# Patient Record
Sex: Female | Born: 1970 | Race: White | Hispanic: No | Smoking: Former smoker
Health system: Southern US, Community
[De-identification: ages and names within clinical notes are randomized; demographics above are authoritative.]

## PROBLEM LIST (undated history)

## (undated) DIAGNOSIS — G5603 Carpal tunnel syndrome, bilateral upper limbs: Secondary | ICD-10-CM

## (undated) DIAGNOSIS — R51 Headache: Secondary | ICD-10-CM

## (undated) DIAGNOSIS — R112 Nausea with vomiting, unspecified: Secondary | ICD-10-CM

## (undated) DIAGNOSIS — M1990 Unspecified osteoarthritis, unspecified site: Secondary | ICD-10-CM

## (undated) DIAGNOSIS — F319 Bipolar disorder, unspecified: Secondary | ICD-10-CM

## (undated) DIAGNOSIS — F419 Anxiety disorder, unspecified: Secondary | ICD-10-CM

## (undated) DIAGNOSIS — Z9889 Other specified postprocedural states: Secondary | ICD-10-CM

## (undated) DIAGNOSIS — M549 Dorsalgia, unspecified: Secondary | ICD-10-CM

## (undated) DIAGNOSIS — G8929 Other chronic pain: Secondary | ICD-10-CM

## (undated) HISTORY — PX: ABDOMINAL HYSTERECTOMY: SHX81

## (undated) HISTORY — PX: CERVIX SURGERY: SHX593

## (undated) HISTORY — PX: TUBAL LIGATION: SHX77

## (undated) HISTORY — PX: ANTERIOR CRUCIATE LIGAMENT REPAIR: SHX115

## (undated) HISTORY — PX: KNEE ARTHROSCOPY: SUR90

---
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
## 1989-01-11 HISTORY — PX: KNEE ARTHROSCOPY: SUR90

## 1994-01-11 HISTORY — PX: TUBAL LIGATION: SHX77

## 2005-01-11 HISTORY — PX: ABDOMINAL HYSTERECTOMY: SHX81

## 2009-03-11 ENCOUNTER — Emergency Department (HOSPITAL_COMMUNITY): Admission: EM | Admit: 2009-03-11 | Discharge: 2009-03-11 | Payer: Self-pay | Admitting: Family Medicine

## 2009-03-25 ENCOUNTER — Emergency Department (HOSPITAL_COMMUNITY): Admission: EM | Admit: 2009-03-25 | Discharge: 2009-03-25 | Payer: Self-pay | Admitting: Family Medicine

## 2009-05-14 ENCOUNTER — Ambulatory Visit: Payer: Self-pay | Admitting: Internal Medicine

## 2009-05-27 ENCOUNTER — Ambulatory Visit: Payer: Self-pay | Admitting: Internal Medicine

## 2009-06-04 ENCOUNTER — Ambulatory Visit: Payer: Self-pay | Admitting: Internal Medicine

## 2009-07-02 ENCOUNTER — Ambulatory Visit: Payer: Self-pay | Admitting: Internal Medicine

## 2009-07-09 ENCOUNTER — Encounter
Admission: RE | Admit: 2009-07-09 | Discharge: 2009-10-07 | Payer: Self-pay | Admitting: Physical Medicine and Rehabilitation

## 2009-07-16 ENCOUNTER — Ambulatory Visit: Payer: Self-pay | Admitting: Physical Medicine and Rehabilitation

## 2009-07-30 ENCOUNTER — Ambulatory Visit (HOSPITAL_COMMUNITY)
Admission: RE | Admit: 2009-07-30 | Discharge: 2009-07-30 | Payer: Self-pay | Admitting: Physical Medicine and Rehabilitation

## 2009-08-15 ENCOUNTER — Ambulatory Visit: Payer: Self-pay | Admitting: Physical Medicine and Rehabilitation

## 2009-09-12 ENCOUNTER — Ambulatory Visit: Payer: Self-pay | Admitting: Physical Medicine and Rehabilitation

## 2009-11-04 ENCOUNTER — Encounter
Admission: RE | Admit: 2009-11-04 | Discharge: 2009-11-04 | Payer: Self-pay | Source: Home / Self Care | Attending: Physical Medicine and Rehabilitation | Admitting: Physical Medicine and Rehabilitation

## 2010-02-04 ENCOUNTER — Encounter
Admission: RE | Admit: 2010-02-04 | Discharge: 2010-02-04 | Payer: Self-pay | Source: Home / Self Care | Attending: Preventative Medicine | Admitting: Preventative Medicine

## 2010-02-19 ENCOUNTER — Other Ambulatory Visit: Payer: Self-pay | Admitting: Preventative Medicine

## 2010-02-19 DIAGNOSIS — M48061 Spinal stenosis, lumbar region without neurogenic claudication: Secondary | ICD-10-CM

## 2010-02-20 ENCOUNTER — Ambulatory Visit
Admission: RE | Admit: 2010-02-20 | Discharge: 2010-02-20 | Disposition: A | Discharge status: Home / Self Care | Payer: Medicaid Other | Source: Ambulatory Visit | Attending: Preventative Medicine | Admitting: Preventative Medicine

## 2010-02-20 DIAGNOSIS — M48061 Spinal stenosis, lumbar region without neurogenic claudication: Secondary | ICD-10-CM

## 2010-06-01 ENCOUNTER — Other Ambulatory Visit: Payer: Self-pay | Admitting: Preventative Medicine

## 2010-06-01 DIAGNOSIS — M48061 Spinal stenosis, lumbar region without neurogenic claudication: Secondary | ICD-10-CM

## 2010-06-03 ENCOUNTER — Other Ambulatory Visit: Payer: Medicaid Other

## 2010-06-10 ENCOUNTER — Ambulatory Visit
Admission: RE | Admit: 2010-06-10 | Discharge: 2010-06-10 | Disposition: A | Discharge status: Home / Self Care | Payer: Medicaid Other | Source: Ambulatory Visit | Attending: Preventative Medicine | Admitting: Preventative Medicine

## 2010-06-10 DIAGNOSIS — M48061 Spinal stenosis, lumbar region without neurogenic claudication: Secondary | ICD-10-CM

## 2010-12-18 ENCOUNTER — Other Ambulatory Visit: Payer: Self-pay | Admitting: Orthopedic Surgery

## 2010-12-21 ENCOUNTER — Other Ambulatory Visit: Payer: Self-pay | Admitting: Orthopedic Surgery

## 2011-01-07 ENCOUNTER — Encounter (HOSPITAL_COMMUNITY): Payer: Self-pay | Admitting: Pharmacy Technician

## 2011-01-14 ENCOUNTER — Encounter (HOSPITAL_COMMUNITY): Payer: Self-pay

## 2011-01-14 ENCOUNTER — Encounter (HOSPITAL_COMMUNITY)
Admission: RE | Admit: 2011-01-14 | Discharge: 2011-01-14 | Payer: Medicaid Other | Source: Ambulatory Visit | Attending: Orthopedic Surgery | Admitting: Orthopedic Surgery

## 2011-01-14 HISTORY — DX: Other specified postprocedural states: Z98.890

## 2011-01-14 HISTORY — DX: Anxiety disorder, unspecified: F41.9

## 2011-01-14 HISTORY — DX: Nausea with vomiting, unspecified: R11.2

## 2011-01-14 HISTORY — DX: Headache: R51

## 2011-01-14 HISTORY — DX: Other specified postprocedural states: R11.2

## 2011-01-14 LAB — BASIC METABOLIC PANEL
CO2: 31 mEq/L (ref 19–32)
Calcium: 9.4 mg/dL (ref 8.4–10.5)
Creatinine, Ser: 0.66 mg/dL (ref 0.50–1.10)
GFR calc Af Amer: >90 mL/min (ref >90)
GFR calc non Af Amer: >90 mL/min (ref >90)
Glucose, Bld: 81 mg/dL (ref 70–99)

## 2011-01-14 LAB — DIFFERENTIAL
Basophils Relative: 1 % (ref 0–1)
Lymphocytes Relative: 19 % (ref 12–46)
Lymphs Abs: 1 10*3/uL (ref 0.7–4.0)
Monocytes Relative: 6 % (ref 3–12)
Neutro Abs: 3.9 10*3/uL (ref 1.7–7.7)
Neutrophils Relative %: 72 % (ref 43–77)

## 2011-01-14 LAB — URINALYSIS, ROUTINE W REFLEX MICROSCOPIC
Glucose, UA: NEGATIVE mg/dL
Ketones, ur: NEGATIVE mg/dL
Leukocytes, UA: NEGATIVE
Protein, ur: NEGATIVE mg/dL

## 2011-01-14 LAB — PROTIME-INR: INR: 0.81 (ref 0.00–1.49)

## 2011-01-14 LAB — CBC
Hemoglobin: 14.1 g/dL (ref 12.0–15.0)
RBC: 4.71 MIL/uL (ref 3.87–5.11)
WBC: 5.4 10*3/uL (ref 4.0–10.5)

## 2011-01-14 LAB — TYPE AND SCREEN: ABO/RH(D): AB NEG

## 2011-01-14 LAB — APTT: aPTT: 30 seconds (ref 24–37)

## 2011-01-14 MED ORDER — CHLORHEXIDINE GLUCONATE 4 % EX LIQD: 60.0000 mL | Freq: Once | CUTANEOUS | Status: DC

## 2011-01-14 NOTE — Pre-Procedure Instructions (Signed)
19 Tiffany Romero  01/14/2011   Your procedure is scheduled on:  Jan. 7, 2013  Report to [facility] at 1030 AM.  Call this number if you have problems the morning of surgery: [phone]   Remember:   Do not eat food:After Midnight.  May have clear liquids: up to 4 Hours before arrival.  Clear liquids include soda, tea, black coffee, apple or grape juice, broth.  Take these medicines the morning of surgery with A SIP OF WATER: pain pill   Do not wear jewelry, make-up or nail polish.  Do not wear lotions, powders, or perfumes. You may wear deodorant.  Do not shave 48 hours prior to surgery.  Do not bring valuables to the hospital.  Contacts, dentures or bridgework may not be worn into surgery.  Leave suitcase in the car. After surgery it may be brought to your room.  For patients admitted to the hospital, checkout time is 11:00 AM the day of discharge.   Patients discharged the day of surgery will not be allowed to drive home.  Name and phone number of your driver: [name]  Special Instructions: CHG Shower Use Special Wash: 1/2 bottle night before surgery and 1/2 bottle morning of surgery.   Please read over the following fact sheets that you were given: Pain Booklet, Blood Transfusion Information, Total Joint Packet and Surgical Site Infection Prevention

## 2011-01-16 DIAGNOSIS — M1610 Unilateral primary osteoarthritis, unspecified hip: Secondary | ICD-10-CM | POA: Diagnosis present

## 2011-01-16 NOTE — H&P (Signed)
Tiffany Romero is an 41 y.o. female.   Chief Complaint: Right Hip pain, endstage arthritis, bone on bone HPI: Tiffany Romero is a 41 year old with an 34-month history of right hip pain.  She localizes her pain to the groin and the posterior aspect of the hip.  She had x-rays done at [facility] that demonstrated end stage arthritis. She is currently on social security disability for hip and back pain and she plans to start a new job soon. She has had epidural injections into her lower back that have provided her with significant pain relief and right now her hip pain is her main complaint. She has failed treatment with NSAIDS, narcotics, PT, and intrarticular cortisone. Pain wakes her at nite and limits activities. She cna only walk 2-3 blocks without rest. She has declinrd use of a cane  Past Medical History  Diagnosis Date  . PONV (postoperative nausea and vomiting)   . Headache     migraines related to stress  . Anxiety     takes lexapro    Past Surgical History  Procedure Date  . Cesarean section     1989  . Anterior cruciate ligament repair   . Knee arthroscopy   . Tubal ligation     1996  . Abdominal hysterectomy     2007  . Cervix surgery     No family history on file. Social History:  reports that she has been smoking.  She does not have any smokeless tobacco history on file. She reports that she does not drink alcohol or use illicit drugs.  Allergies: No Known Allergies  No current facility-administered medications on file as of .   No current outpatient prescriptions on file as of .    Results for orders placed during the hospital encounter of 01/14/11 (from the past 48 hour(s))  SURGICAL PCR SCREEN     Status: Abnormal   Collection Time   01/14/11  1:13 PM      Component Value Range Comment   MRSA, PCR NEGATIVE  NEGATIVE     Staphylococcus aureus POSITIVE (*) NEGATIVE    TYPE AND SCREEN     Status: Normal   Collection Time   01/14/11  1:30 PM   Component Value Range Comment   ABO/RH(D) AB NEG      Antibody Screen NEG      Sample Expiration 01/28/2011     ABO/RH     Status: Normal   Collection Time   01/14/11  1:30 PM      Component Value Range Comment   ABO/RH(D) AB NEG     APTT     Status: Normal   Collection Time   01/14/11  1:32 PM      Component Value Range Comment   aPTT 30  24 - 37 (seconds)   BASIC METABOLIC PANEL     Status: Abnormal   Collection Time   01/14/11  1:32 PM      Component Value Range Comment   Sodium 139  135 - 145 (mEq/L)    Potassium 4.4  3.5 - 5.1 (mEq/L)    Chloride 101  96 - 112 (mEq/L)    CO2 31  19 - 32 (mEq/L)    Glucose, Bld 81  70 - 99 (mg/dL)    BUN 5 (*) 6 - 23 (mg/dL)    Creatinine, Ser 1.61  0.50 - 1.10 (mg/dL)    Calcium 9.4  8.4 - 10.5 (mg/dL)  GFR calc non Af Amer >90  >90 (mL/min)    GFR calc Af Amer >90  >90 (mL/min)   CBC     Status: Normal   Collection Time   01/14/11  1:32 PM      Component Value Range Comment   WBC 5.4  4.0 - 10.5 (K/uL)    RBC 4.71  3.87 - 5.11 (MIL/uL)    Hemoglobin 14.1  12.0 - 15.0 (g/dL)    HCT 54.0  98.1 - 19.1 (%)    MCV 91.7  78.0 - 100.0 (fL)    MCH 29.9  26.0 - 34.0 (pg)    MCHC 32.6  30.0 - 36.0 (g/dL)    RDW 47.8  29.5 - 62.1 (%)    Platelets 191  150 - 400 (K/uL)   DIFFERENTIAL     Status: Normal   Collection Time   01/14/11  1:32 PM      Component Value Range Comment   Neutrophils Relative 72  43 - 77 (%)    Neutro Abs 3.9  1.7 - 7.7 (K/uL)    Lymphocytes Relative 19  12 - 46 (%)    Lymphs Abs 1.0  0.7 - 4.0 (K/uL)    Monocytes Relative 6  3 - 12 (%)    Monocytes Absolute 0.3  0.1 - 1.0 (K/uL)    Eosinophils Relative 2  0 - 5 (%)    Eosinophils Absolute 0.1  0.0 - 0.7 (K/uL)    Basophils Relative 1  0 - 1 (%)    Basophils Absolute 0.1  0.0 - 0.1 (K/uL)   PROTIME-INR     Status: Abnormal   Collection Time   01/14/11  1:32 PM      Component Value Range Comment   Prothrombin Time 11.4 (*) 11.6 - 15.2 (seconds)    INR 0.81  0.00 - 1.49     URINALYSIS, ROUTINE W REFLEX MICROSCOPIC     Status: Normal   Collection Time   01/14/11  1:39 PM      Component Value Range Comment   Color, Urine YELLOW  YELLOW     APPearance CLEAR  CLEAR     Specific Gravity, Urine 1.016  1.005 - 1.030     pH 7.5  5.0 - 8.0     Glucose, UA NEGATIVE  NEGATIVE (mg/dL)    Hgb urine dipstick NEGATIVE  NEGATIVE     Bilirubin Urine NEGATIVE  NEGATIVE     Ketones, ur NEGATIVE  NEGATIVE (mg/dL)    Protein, ur NEGATIVE  NEGATIVE (mg/dL)    Urobilinogen, UA 0.2  0.0 - 1.0 (mg/dL)    Nitrite NEGATIVE  NEGATIVE     Leukocytes, UA NEGATIVE  NEGATIVE  MICROSCOPIC NOT DONE ON URINES WITH NEGATIVE PROTEIN, BLOOD, LEUKOCYTES, NITRITE, OR GLUCOSE <1000 mg/dL.   No results found.  Review of Systems  Constitutional: Negative.   HENT:       Migraines uses maxalt  Eyes: Negative.   Respiratory: Negative.   Cardiovascular: Negative.   Gastrointestinal: Negative.   Genitourinary: Negative.   Skin: Negative.   Neurological: Positive for headaches.  Endo/Heme/Allergies: Negative.   Psychiatric/Behavioral: Negative.     There were no vitals taken for this visit. Physical Exam  Constitutional: She is oriented to person, place, and time. She appears well-developed and well-nourished.  HENT:  Head: Normocephalic.  Neck: Normal range of motion. Neck supple.  Cardiovascular: Normal rate and regular rhythm.   Respiratory: Effort normal and breath sounds normal.  GI: Soft. Bowel sounds are normal.  Musculoskeletal:       The right hip demonstrates significant pain with attempts at internal andexternal rotation with the knee flexed and fully extended.  She has a negative foot tap.  Skin is intact. Neurovascular is within normal limits.   Neurological: She is alert and oriented to person, place, and time.  Skin: Skin is warm and dry.  Psychiatric: She has a normal mood and affect. Her behavior is normal. Judgment and thought content normal.    Imaging: Two views of  the right hip were ordered, performed, and interpreted by me today demonstrate end stage arthritis.  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      Ellina Sivertsen J 01/16/2011, 10:07 AM

## 2011-01-17 MED ORDER — CEFAZOLIN SODIUM-DEXTROSE 2-3 GM-% IV SOLR
2.0000 g | INTRAVENOUS | Status: AC
  Administered 2011-01-18: 2 g via INTRAVENOUS
  Filled 2011-01-17 (×2): qty 50

## 2011-01-18 ENCOUNTER — Encounter (HOSPITAL_COMMUNITY)
Admission: RE | Disposition: A | Discharge status: Home / Self Care | Payer: Self-pay | Source: Ambulatory Visit | Attending: Orthopedic Surgery

## 2011-01-18 ENCOUNTER — Ambulatory Visit (HOSPITAL_COMMUNITY): Payer: Medicaid Other

## 2011-01-18 ENCOUNTER — Encounter (HOSPITAL_COMMUNITY): Payer: Self-pay | Admitting: Surgery

## 2011-01-18 ENCOUNTER — Encounter (HOSPITAL_COMMUNITY): Payer: Self-pay | Admitting: Anesthesiology

## 2011-01-18 ENCOUNTER — Ambulatory Visit (HOSPITAL_COMMUNITY): Payer: Medicaid Other | Admitting: Anesthesiology

## 2011-01-18 ENCOUNTER — Inpatient Hospital Stay (HOSPITAL_COMMUNITY)
Admission: RE | Admit: 2011-01-18 | Discharge: 2011-01-20 | DRG: 470 | Disposition: A | Discharge status: Home / Self Care | Payer: Medicaid Other | Source: Ambulatory Visit | Attending: Orthopedic Surgery | Admitting: Orthopedic Surgery

## 2011-01-18 DIAGNOSIS — F17200 Nicotine dependence, unspecified, uncomplicated: Secondary | ICD-10-CM | POA: Diagnosis present

## 2011-01-18 DIAGNOSIS — Z01812 Encounter for preprocedural laboratory examination: Secondary | ICD-10-CM

## 2011-01-18 DIAGNOSIS — F411 Generalized anxiety disorder: Secondary | ICD-10-CM | POA: Diagnosis present

## 2011-01-18 DIAGNOSIS — M1610 Unilateral primary osteoarthritis, unspecified hip: Principal | ICD-10-CM | POA: Diagnosis present

## 2011-01-18 DIAGNOSIS — M169 Osteoarthritis of hip, unspecified: Principal | ICD-10-CM | POA: Diagnosis present

## 2011-01-18 HISTORY — PX: TOTAL HIP ARTHROPLASTY: SHX124

## 2011-01-18 SURGERY — ARTHROPLASTY, HIP, TOTAL,POSTERIOR APPROACH
Anesthesia: General | Site: Hip | Laterality: Right | Wound class: Clean

## 2011-01-18 MED ORDER — WARFARIN VIDEO: Freq: Once | Status: DC

## 2011-01-18 MED ORDER — ONDANSETRON HCL 4 MG/2ML IJ SOLN
INTRAMUSCULAR | Status: DC | PRN
  Administered 2011-01-18 (×2): 4 mg via INTRAVENOUS

## 2011-01-18 MED ORDER — WARFARIN SODIUM 7.5 MG PO TABS
7.5000 mg | ORAL_TABLET | Freq: Once | ORAL | Status: AC
  Administered 2011-01-18: 7.5 mg via ORAL
  Filled 2011-01-18: qty 1

## 2011-01-18 MED ORDER — HYDROMORPHONE HCL PF 1 MG/ML IJ SOLN
0.2500 mg | INTRAMUSCULAR | Status: DC | PRN
  Administered 2011-01-18 (×3): 0.5 mg via INTRAVENOUS

## 2011-01-18 MED ORDER — DEXTROSE-NACL 5-0.45 % IV SOLN: INTRAVENOUS | Status: DC

## 2011-01-18 MED ORDER — METOCLOPRAMIDE HCL 5 MG/ML IJ SOLN
5.0000 mg | Freq: Three times a day (TID) | INTRAMUSCULAR | Status: DC | PRN
  Filled 2011-01-18: qty 2

## 2011-01-18 MED ORDER — LACTATED RINGERS IV SOLN
INTRAVENOUS | Status: DC | PRN
  Administered 2011-01-18 (×3): via INTRAVENOUS

## 2011-01-18 MED ORDER — ALUMINUM HYDROXIDE GEL 320 MG/5ML PO SUSP
15.0000 mL | ORAL | Status: DC | PRN
  Filled 2011-01-18: qty 30

## 2011-01-18 MED ORDER — DEXAMETHASONE SODIUM PHOSPHATE 4 MG/ML IJ SOLN
INTRAMUSCULAR | Status: DC | PRN
  Administered 2011-01-18: 8 mg via INTRAVENOUS

## 2011-01-18 MED ORDER — ONDANSETRON HCL 4 MG/2ML IJ SOLN: 4.0000 mg | Freq: Once | INTRAMUSCULAR | Status: DC | PRN

## 2011-01-18 MED ORDER — ACETAMINOPHEN 10 MG/ML IV SOLN
INTRAVENOUS | Status: DC | PRN
  Administered 2011-01-18: 1000 mg via INTRAVENOUS

## 2011-01-18 MED ORDER — ACETAMINOPHEN 650 MG RE SUPP: 650.0000 mg | Freq: Four times a day (QID) | RECTAL | Status: DC | PRN

## 2011-01-18 MED ORDER — ACETAMINOPHEN 325 MG PO TABS: 650.0000 mg | ORAL_TABLET | Freq: Four times a day (QID) | ORAL | Status: DC | PRN

## 2011-01-18 MED ORDER — ESCITALOPRAM OXALATE 10 MG PO TABS
10.0000 mg | ORAL_TABLET | Freq: Every day | ORAL | Status: DC
  Administered 2011-01-18 – 2011-01-19 (×2): 10 mg via ORAL
  Filled 2011-01-18 (×3): qty 1

## 2011-01-18 MED ORDER — LACTATED RINGERS IV SOLN
INTRAVENOUS | Status: DC
  Administered 2011-01-18: 12:00:00 via INTRAVENOUS

## 2011-01-18 MED ORDER — HYDROMORPHONE HCL PF 1 MG/ML IJ SOLN
INTRAMUSCULAR | Status: AC
  Filled 2011-01-18: qty 1

## 2011-01-18 MED ORDER — GLYCOPYRROLATE 0.2 MG/ML IJ SOLN
INTRAMUSCULAR | Status: DC | PRN
  Administered 2011-01-18: .4 mg via INTRAVENOUS

## 2011-01-18 MED ORDER — BISACODYL 5 MG PO TBEC: 5.0000 mg | DELAYED_RELEASE_TABLET | Freq: Every day | ORAL | Status: DC | PRN

## 2011-01-18 MED ORDER — SUMATRIPTAN SUCCINATE 50 MG PO TABS
50.0000 mg | ORAL_TABLET | ORAL | Status: DC | PRN
  Filled 2011-01-18: qty 1

## 2011-01-18 MED ORDER — SENNOSIDES-DOCUSATE SODIUM 8.6-50 MG PO TABS: 1.0000 | ORAL_TABLET | Freq: Every evening | ORAL | Status: DC | PRN

## 2011-01-18 MED ORDER — MENTHOL 3 MG MT LOZG: 1.0000 | LOZENGE | OROMUCOSAL | Status: DC | PRN

## 2011-01-18 MED ORDER — CYCLOBENZAPRINE HCL 10 MG PO TABS
10.0000 mg | ORAL_TABLET | Freq: Three times a day (TID) | ORAL | Status: DC
Start: 2011-01-18 — End: 2011-01-20
  Administered 2011-01-18 – 2011-01-20 (×7): 10 mg via ORAL
  Filled 2011-01-18 (×9): qty 1

## 2011-01-18 MED ORDER — OXYCODONE HCL 5 MG PO TABS
5.0000 mg | ORAL_TABLET | ORAL | Status: DC | PRN
  Administered 2011-01-18 – 2011-01-20 (×8): 10 mg via ORAL
  Administered 2011-01-20: 5 mg via ORAL
  Administered 2011-01-20 (×2): 10 mg via ORAL
  Filled 2011-01-18 (×11): qty 2

## 2011-01-18 MED ORDER — FENTANYL CITRATE 0.05 MG/ML IJ SOLN
INTRAMUSCULAR | Status: DC | PRN
  Administered 2011-01-18: 25 ug via INTRAVENOUS
  Administered 2011-01-18: 50 ug via INTRAVENOUS
  Administered 2011-01-18: 25 ug via INTRAVENOUS
  Administered 2011-01-18 (×5): 50 ug via INTRAVENOUS
  Administered 2011-01-18: 150 ug via INTRAVENOUS

## 2011-01-18 MED ORDER — MIDAZOLAM HCL 5 MG/5ML IJ SOLN
INTRAMUSCULAR | Status: DC | PRN
  Administered 2011-01-18: 2 mg via INTRAVENOUS

## 2011-01-18 MED ORDER — DROPERIDOL 2.5 MG/ML IJ SOLN
INTRAMUSCULAR | Status: DC | PRN
  Administered 2011-01-18: 0.625 mg via INTRAVENOUS

## 2011-01-18 MED ORDER — BUPIVACAINE-EPINEPHRINE 0.5% -1:200000 IJ SOLN
INTRAMUSCULAR | Status: DC | PRN
  Administered 2011-01-18: 20 mL

## 2011-01-18 MED ORDER — ENOXAPARIN SODIUM 40 MG/0.4ML ~~LOC~~ SOLN
40.0000 mg | SUBCUTANEOUS | Status: DC
  Administered 2011-01-19 – 2011-01-20 (×2): 40 mg via SUBCUTANEOUS
  Filled 2011-01-18 (×3): qty 0.4

## 2011-01-18 MED ORDER — ROCURONIUM BROMIDE 100 MG/10ML IV SOLN
INTRAVENOUS | Status: DC | PRN
  Administered 2011-01-18: 50 mg via INTRAVENOUS

## 2011-01-18 MED ORDER — ONDANSETRON HCL 4 MG/2ML IJ SOLN: 4.0000 mg | Freq: Four times a day (QID) | INTRAMUSCULAR | Status: DC | PRN

## 2011-01-18 MED ORDER — CEFAZOLIN SODIUM 1-5 GM-% IV SOLN: 1.0000 g | INTRAVENOUS | Status: DC

## 2011-01-18 MED ORDER — HYDROMORPHONE HCL PF 1 MG/ML IJ SOLN
0.5000 mg | INTRAMUSCULAR | Status: DC | PRN
  Administered 2011-01-18: 0.5 mg via INTRAVENOUS
  Administered 2011-01-18 – 2011-01-20 (×10): 1 mg via INTRAVENOUS
  Filled 2011-01-18 (×9): qty 1

## 2011-01-18 MED ORDER — HYDROCODONE-ACETAMINOPHEN 5-325 MG PO TABS: 1.0000 | ORAL_TABLET | ORAL | Status: DC | PRN

## 2011-01-18 MED ORDER — ACETAMINOPHEN 10 MG/ML IV SOLN
INTRAVENOUS | Status: AC
  Filled 2011-01-18: qty 100

## 2011-01-18 MED ORDER — DIPHENHYDRAMINE HCL 12.5 MG/5ML PO ELIX
12.5000 mg | ORAL_SOLUTION | ORAL | Status: DC | PRN
  Filled 2011-01-18: qty 10

## 2011-01-18 MED ORDER — ZOLPIDEM TARTRATE 5 MG PO TABS
5.0000 mg | ORAL_TABLET | Freq: Every evening | ORAL | Status: DC | PRN
  Administered 2011-01-18 – 2011-01-19 (×2): 5 mg via ORAL
  Filled 2011-01-18 (×2): qty 1

## 2011-01-18 MED ORDER — FLEET ENEMA 7-19 GM/118ML RE ENEM: 1.0000 | ENEMA | Freq: Once | RECTAL | Status: AC | PRN

## 2011-01-18 MED ORDER — PROPOFOL 10 MG/ML IV EMUL
INTRAVENOUS | Status: DC | PRN
  Administered 2011-01-18: 140 mg via INTRAVENOUS

## 2011-01-18 MED ORDER — NEOSTIGMINE METHYLSULFATE 1 MG/ML IJ SOLN
INTRAMUSCULAR | Status: DC | PRN
  Administered 2011-01-18: 3 mg via INTRAVENOUS

## 2011-01-18 MED ORDER — KCL IN DEXTROSE-NACL 20-5-0.45 MEQ/L-%-% IV SOLN
INTRAVENOUS | Status: DC
  Administered 2011-01-18 – 2011-01-19 (×3): via INTRAVENOUS
  Filled 2011-01-18 (×8): qty 1000

## 2011-01-18 MED ORDER — PHENOL 1.4 % MT LIQD
1.0000 | OROMUCOSAL | Status: DC | PRN
  Filled 2011-01-18: qty 177

## 2011-01-18 MED ORDER — MEPERIDINE HCL 25 MG/ML IJ SOLN: 6.2500 mg | INTRAMUSCULAR | Status: DC | PRN

## 2011-01-18 MED ORDER — ONDANSETRON HCL 4 MG PO TABS: 4.0000 mg | ORAL_TABLET | Freq: Four times a day (QID) | ORAL | Status: DC | PRN

## 2011-01-18 MED ORDER — METOCLOPRAMIDE HCL 10 MG PO TABS: 5.0000 mg | ORAL_TABLET | Freq: Three times a day (TID) | ORAL | Status: DC | PRN

## 2011-01-18 MED ORDER — SODIUM CHLORIDE 0.9 % IR SOLN
Status: DC | PRN
  Administered 2011-01-18: 1000 mL

## 2011-01-18 MED ORDER — PATIENT'S GUIDE TO USING COUMADIN BOOK
Freq: Once | Status: AC
  Administered 2011-01-18: 18:00:00
  Filled 2011-01-18: qty 1

## 2011-01-18 SURGICAL SUPPLY — 59 items
BLADE SAW SAG 73X25 THK (BLADE) ×1
BLADE SAW SGTL 18X1.27X75 (BLADE) IMPLANT
BLADE SAW SGTL 73X25 THK (BLADE) ×1 IMPLANT
BLADE SAW SGTL MED 73X18.5 STR (BLADE) IMPLANT
BRUSH FEMORAL CANAL (MISCELLANEOUS) IMPLANT
CLOTH BEACON ORANGE TIMEOUT ST (SAFETY) ×2 IMPLANT
COVER BACK TABLE 24X17X13 BIG (DRAPES) IMPLANT
COVER SURGICAL LIGHT HANDLE (MISCELLANEOUS) ×4 IMPLANT
DRAPE ORTHO SPLIT 77X108 STRL (DRAPES) ×1
DRAPE PROXIMA HALF (DRAPES) ×2 IMPLANT
DRAPE SURG ORHT 6 SPLT 77X108 (DRAPES) ×1 IMPLANT
DRAPE U-SHAPE 47X51 STRL (DRAPES) ×2 IMPLANT
DRILL BIT 7/64X5 (BIT) ×2 IMPLANT
DRSG MEPILEX BORDER 4X12 (GAUZE/BANDAGES/DRESSINGS) ×2 IMPLANT
DRSG MEPILEX BORDER 4X8 (GAUZE/BANDAGES/DRESSINGS) ×2 IMPLANT
DURAPREP 26ML APPLICATOR (WOUND CARE) ×2 IMPLANT
ELECT BLADE 4.0 EZ CLEAN MEGAD (MISCELLANEOUS) ×2
ELECT CAUTERY BLADE 6.4 (BLADE) ×2 IMPLANT
ELECT REM PT RETURN 9FT ADLT (ELECTROSURGICAL) ×2
ELECTRODE BLDE 4.0 EZ CLN MEGD (MISCELLANEOUS) ×1 IMPLANT
ELECTRODE REM PT RTRN 9FT ADLT (ELECTROSURGICAL) ×1 IMPLANT
FLOSEAL 10ML (HEMOSTASIS) IMPLANT
GAUZE XEROFORM 1X8 LF (GAUZE/BANDAGES/DRESSINGS) ×2 IMPLANT
GLOVE BIO SURGEON STRL SZ7 (GLOVE) ×4 IMPLANT
GLOVE BIO SURGEON STRL SZ7.5 (GLOVE) ×2 IMPLANT
GLOVE BIOGEL PI IND STRL 6.5 (GLOVE) ×3 IMPLANT
GLOVE BIOGEL PI IND STRL 7.0 (GLOVE) ×2 IMPLANT
GLOVE BIOGEL PI IND STRL 8 (GLOVE) ×1 IMPLANT
GLOVE BIOGEL PI INDICATOR 6.5 (GLOVE) ×3
GLOVE BIOGEL PI INDICATOR 7.0 (GLOVE) ×2
GLOVE BIOGEL PI INDICATOR 8 (GLOVE) ×1
GLOVE ECLIPSE 6.5 STRL STRAW (GLOVE) ×4 IMPLANT
GOWN PREVENTION PLUS XLARGE (GOWN DISPOSABLE) ×4 IMPLANT
GOWN STRL NON-REIN LRG LVL3 (GOWN DISPOSABLE) ×6 IMPLANT
HANDPIECE INTERPULSE COAX TIP (DISPOSABLE)
HOOD PEEL AWAY FACE SHEILD DIS (HOOD) ×4 IMPLANT
KIT BASIN OR (CUSTOM PROCEDURE TRAY) ×2 IMPLANT
KIT ROOM TURNOVER OR (KITS) ×2 IMPLANT
MANIFOLD NEPTUNE II (INSTRUMENTS) ×2 IMPLANT
NEEDLE 22X1 1/2 (OR ONLY) (NEEDLE) ×2 IMPLANT
NS IRRIG 1000ML POUR BTL (IV SOLUTION) ×2 IMPLANT
PACK TOTAL JOINT (CUSTOM PROCEDURE TRAY) ×2 IMPLANT
PAD ARMBOARD 7.5X6 YLW CONV (MISCELLANEOUS) ×4 IMPLANT
PASSER SUT SWANSON 36MM LOOP (INSTRUMENTS) ×2 IMPLANT
PRESSURIZER FEMORAL UNIV (MISCELLANEOUS) IMPLANT
SET HNDPC FAN SPRY TIP SCT (DISPOSABLE) IMPLANT
SPONGE LAP 18X18 X RAY DECT (DISPOSABLE) ×2 IMPLANT
SUT ETHIBOND 2 V 37 (SUTURE) ×2 IMPLANT
SUT ETHILON 3 0 FSL (SUTURE) ×4 IMPLANT
SUT VIC AB 0 CTB1 27 (SUTURE) ×2 IMPLANT
SUT VIC AB 1 CTX 36 (SUTURE) ×1
SUT VIC AB 1 CTX36XBRD ANBCTR (SUTURE) ×1 IMPLANT
SUT VIC AB 2-0 CTB1 (SUTURE) ×2 IMPLANT
SYR CONTROL 10ML LL (SYRINGE) ×2 IMPLANT
TOWEL OR 17X24 6PK STRL BLUE (TOWEL DISPOSABLE) ×2 IMPLANT
TOWEL OR 17X26 10 PK STRL BLUE (TOWEL DISPOSABLE) ×2 IMPLANT
TOWER CARTRIDGE SMART MIX (DISPOSABLE) IMPLANT
TRAY FOLEY CATH 14FR (SET/KITS/TRAYS/PACK) ×2 IMPLANT
WATER STERILE IRR 1000ML POUR (IV SOLUTION) IMPLANT

## 2011-01-18 NOTE — Progress Notes (Signed)
ANTICOAGULATION CONSULT NOTE - Initial Consult  Pharmacy Consult for Coumadin Indication: VTE prophylaxis s/p THA  No Known Allergies  Patient Measurements:  Stated weight = 190 lbs   Vital Signs: Temp: 97.5 F (36.4 C) (01/07 1645) BP: 116/62 mmHg (01/07 1645) Pulse Rate: 59  (01/07 1645)  Labs: No results found for this basename: HGB:2,HCT:3,PLT:3,APTT:3,LABPROT:3,INR:3,HEPARINUNFRC:3,CREATININE:3,CKTOTAL:3,CKMB:3,TROPONINI:3 in the last 72 hours CrCl is unknown because there is no height on file for the current visit.  Medical History: Past Medical History  Diagnosis Date  . PONV (postoperative nausea and vomiting)   . Headache     migraines related to stress  . Anxiety     takes lexapro    Medications:  Scheduled:    . ceFAZolin (ANCEF) IV  2 g Intravenous On Call to OR  . cyclobenzaprine  10 mg Oral TID  . enoxaparin  40 mg Subcutaneous Q24H  . escitalopram  10 mg Oral QHS  . HYDROmorphone      . HYDROmorphone      . DISCONTD: ceFAZolin (ANCEF) IV  1 g Intravenous 60 min Pre-Op    Assessment: 41 year old starting Coumadin for VTE prophylaxis s/p total hip arthroplasty  Goal of Therapy:  INR 2-3   Plan:  1) Coumadin 7.5 mg po x 1 dose tonight 2) Begin Coumadin education 3) Daily PT / INR  Thank you.  Elwin Sleight 01/18/2011,5:37 PM

## 2011-01-18 NOTE — Preoperative (Signed)
Beta Blockers   Reason not to administer Beta Blockers:Not Applicable 

## 2011-01-18 NOTE — Transfer of Care (Signed)
Immediate Anesthesia Transfer of Care Note  Patient: Tiffany Romero  Procedure(s) Performed:  TOTAL HIP ARTHROPLASTY - DEPUY/PINNICAL POLY  Patient Location: PACU  Anesthesia Type: General  Level of Consciousness: awake, alert  and oriented  Airway & Oxygen Therapy: Patient Spontanous Breathing and Patient connected to nasal cannula oxygen  Post-op Assessment: Report given to PACU RN, Post -op Vital signs reviewed and stable and Patient moving all extremities  Post vital signs: Reviewed and stable  Complications: No apparent anesthesia complications

## 2011-01-18 NOTE — Anesthesia Postprocedure Evaluation (Signed)
Anesthesia Post Note  Patient: Tiffany Romero  Procedure(s) Performed:  TOTAL HIP ARTHROPLASTY - DEPUY/PINNICAL POLY  Anesthesia type: General  Patient location: PACU  Post pain: Pain level controlled and Adequate analgesia  Post assessment: Post-op Vital signs reviewed, Patient's Cardiovascular Status Stable, Respiratory Function Stable, Patent Airway and Pain level controlled  Last Vitals:  Filed Vitals:   01/18/11 1530  BP: 111/65  Pulse: 67  Temp:   Resp: 14    Post vital signs: Reviewed and stable  Level of consciousness: awake, alert  and oriented  Complications: No apparent anesthesia complications

## 2011-01-18 NOTE — Op Note (Signed)
OPERATIVE REPORT    DATE OF PROCEDURE:  01/18/2011       PREOPERATIVE DIAGNOSIS:  OSTEOARTHRITIS RIGHT HIP                                                       There is no height or weight on file to calculate BMI.     POSTOPERATIVE DIAGNOSIS:  Osteoarthritis right hip                                                           PROCEDURE:  R total hip arthroplasty using a 52 mm DePuy Pinnacle  Cup, [employer], 10-degree polyethylene liner index superior  and posterior, a +3 36 mm ceramic head, a #[id number] SROM stem, 18Dsm Cone   SURGEON: Jari Dipasquale J    ASSISTANT:   Danielle Laliberte PA-S ANESTHESIA:  General  BLOOD LOSS:700 FLUID REPLACEMENT: 1800 crystalloid DRAINS: Foley Catheter URINE OUTPUT: 300 COMPLICATIONS:  None    INDICATIONS FOR PROCEDURE: A 41 y.o. year-old @GENDER @  With  OSTEOARTHRITIS RIGHT HIP   for 3 years, x-rays show bone-on-bone arthritic changes. Despite conservative measures with observation, anti-inflammatory medicine, narcotics, use of a cane, has severe unremitting pain and can ambulate only a few blocks before resting.  Patient desires elective R total hip arthroplasty to decrease pain and increase function. The risks, benefits, and alternatives were discussed at length including but not limited to the risks of infection, bleeding, nerve injury, stiffness, blood clots, the need for revision surgery, cardiopulmonary complications, among others, and they were willing to proceed.y have been discussed. Questions answered.     PROCEDURE IN DETAIL: The patient was identified by armband,  received preoperative IV antibiotics in the holding area at [facility], taken to the operating room , appropriate anesthetic monitors  were attached and general endotracheal anesthesia induced. Foley catheter was inserted. He was rolled into the L lateral decubitus position and fixed there with a Stulberg Mark II pelvic clamp and the R lower extremity was  then prepped and draped  in the usual sterile fashion from the ankle to the hemipelvis. A time-out  procedure was performed. The skin along the lateral hip and thigh  infiltrated with 10 mL of 0.5% Marcaine and epinephrine solution. We  then made a posterolateral approach to the hip. With a #10 blade, 20 cm  incision through skin and subcutaneous tissue down to the level of the  IT band. Small bleeders were identified and cauterized. IT band cut in  line with skin incision exposing the greater trochanter. A Cobra retractor was placed between the gluteus minimus and the superior hip joint capsule, and a spiked Cobra between the quadratus femoris and the inferior hip joint capsule. This isolated the short  external rotators and piriformis tendons. These were tagged with a #2 Ethibond  suture and cut off their insertion on the intertrochanteric crest. The posterior  capsule was then developed into an acetabular-based flap from Posterior Superior off of the acetabulum out over the femoral neck and back posterior inferior to the acetabular rim. This flap was tagged with two #2 Ethibond sutures and retracted  protecting the sciatic nerve. This exposed the arthritic femoral head and osteophytes. The hip was then flexed and internally rotated, dislocating the femoral head and a standard neck cut performed 1 fingerbreadth above the lesser trochanter.  A spiked Cobra was placed in the cotyloid notch and a Hohmann retractor was then used to lever the femur anteriorly off of the anterior pelvic column. A posterior-inferior wing retractor was placed at the junction of the acetabulum and the ischium completing the acetabular exposure.We then removed the peripheral osteophytes and labrum from the acetabulum. We then reamed the acetabulum up to 51 mm with basket reamers obtaining good coverage in all quadrants, irrigated out with normal  saline solution and hammered into place a 52 mm pinnacle cup in 45  degrees of  abduction and about 20 degrees of anteversion. More  peripheral osteophytes removed and a trial 10-degree liner placed with the  index superior-posterior. The hip was then flexed and internally rotated exposing the  proximal femur, which was entered with the initiating reamer followed by  the axial reamers up to a 13.5 mm full depth and 14mm partial depth. We then conically reamed to 18D to the correct depth for a 42 base neck. The calcar was milled to 18Dsm. A trial cone and stem was inserted in the 25 degrees anteversion, with a +3 36mm trial head. Trial reduction was then performed and excellent stability was noted with at 90 of flexion with 70deg of internal rotation and then full extension with maximal external rotation. The hip could not be dislocated in full extension. The knee could easily flex  to about 130 degrees. We also stretched the abductors at this point,  because of the preexisting adductor contractures. All trial components  were then removed. The acetabulum was irrigated out with normal saline  solution. A titanium Apex [facility] was then screwed into place  followed by a 10-degree polyethylene liner index superior-posterior. On  the femoral side a 18Dsm ZTT1 cone was hammered into place, followed by a [id number] SROM stem in 25 degrees of anteversion. At this point, a +3 36 mm ceramic head was  hammered on the stem. The hip was reduced. We checked our stability  one more time and found to be excellent. The wound was once again  thoroughly irrigated out with normal saline solution pulse lavage. The  capsular flap and short external rotators were repaired back to the  intertrochanteric crest through drill holes with a #2 Ethibond suture.  The IT band was closed with running 1 Vicryl suture. The subcutaneous  tissue with 0 and 2-0 undyed Vicryl suture and the skin with running  interlocking 3-0 nylon suture. Dressing of Xeroform and Mepilex was  then applied. The patient  was then unclamped, rolled supine, awaken extubated and taken to recovery room without difficulty in stable condition.   Gean Birchwood J 01/18/2011, 3:25 PM

## 2011-01-18 NOTE — Anesthesia Preprocedure Evaluation (Addendum)
Anesthesia Evaluation  Patient identified by MRN, date of birth, ID band Patient awake    Reviewed: Allergy & Precautions, H&P , NPO status , Patient's Chart, lab work & pertinent test results  Airway Mallampati: I TM Distance: >3 FB Neck ROM: Full    Dental No notable dental hx. (+) Edentulous Upper and Dental Advisory Given   Pulmonary Current Smoker (1/2 ppd x 20 years),  Smoker         Cardiovascular Regular Normal    Neuro/Psych  Headaches, Anxiety    GI/Hepatic   Endo/Other    Renal/GU      Musculoskeletal   Abdominal   Peds  Hematology   Anesthesia Other Findings   Reproductive/Obstetrics                        Anesthesia Physical Anesthesia Plan  ASA: I  Anesthesia Plan: General   Post-op Pain Management:    Induction: Intravenous  Airway Management Planned: Oral ETT  Additional Equipment:   Intra-op Plan:   Post-operative Plan: Extubation in OR  Informed Consent: I have reviewed the patients History and Physical, chart, labs and discussed the procedure including the risks, benefits and alternatives for the proposed anesthesia with the patient or authorized representative who has indicated his/her understanding and acceptance.   Dental advisory given  Plan Discussed with: Anesthesiologist, CRNA and Surgeon  Anesthesia Plan Comments:         Anesthesia Quick Evaluation

## 2011-01-18 NOTE — Anesthesia Procedure Notes (Signed)
Procedure Name: Intubation Date/Time: 01/18/2011 12:20 PM Performed by: Romie Minus Pre-anesthesia Checklist: Patient identified, Emergency Drugs available, Suction available and Patient being monitored Patient Re-evaluated:Patient Re-evaluated prior to inductionOxygen Delivery Method: Circle System Utilized Preoxygenation: Pre-oxygenation with 100% oxygen Intubation Type: IV induction Ventilation: Mask ventilation without difficulty and Oral airway inserted - appropriate to patient size Laryngoscope Size: Miller and 2 Grade View: Grade I Tube type: Oral Tube size: 7.5 mm Number of attempts: 1

## 2011-01-18 NOTE — Interval H&P Note (Signed)
History and Physical Interval Note:  01/18/2011 11:29 AM  Tiffany Romero  has presented today for surgery, with the diagnosis of OSTEOARTHRITIS RIGHT HIP  The various methods of treatment have been discussed with the patient and family. After consideration of risks, benefits and other options for treatment, the patient has consented to  Procedure(s): TOTAL HIP ARTHROPLASTY as a surgical intervention .  The patients' history has been reviewed, patient examined, no change in status, stable for surgery.  I have reviewed the patients' chart and labs.  Questions were answered to the patient's satisfaction.     [name]

## 2011-01-19 ENCOUNTER — Encounter (HOSPITAL_COMMUNITY): Payer: Self-pay | Admitting: Orthopedic Surgery

## 2011-01-19 LAB — BASIC METABOLIC PANEL
BUN: 6 mg/dL (ref 6–23)
Calcium: 8.4 mg/dL (ref 8.4–10.5)
Chloride: 104 mEq/L (ref 96–112)
Creatinine, Ser: 0.59 mg/dL (ref 0.50–1.10)
GFR calc Af Amer: >90 mL/min (ref >90)
GFR calc non Af Amer: >90 mL/min (ref >90)

## 2011-01-19 LAB — CBC
HCT: 28.4 % — ABNORMAL LOW (ref 36.0–46.0)
MCH: 30.3 pg (ref 26.0–34.0)
MCHC: 33.5 g/dL (ref 30.0–36.0)
MCV: 90.4 fL (ref 78.0–100.0)
Platelets: 183 10*3/uL (ref 150–400)
RDW: 13.6 % (ref 11.5–15.5)
WBC: 10.4 10*3/uL (ref 4.0–10.5)

## 2011-01-19 MED ORDER — WARFARIN SODIUM 7.5 MG PO TABS
7.5000 mg | ORAL_TABLET | Freq: Once | ORAL | Status: AC
  Administered 2011-01-19: 7.5 mg via ORAL
  Filled 2011-01-19: qty 1

## 2011-01-19 MED ORDER — OXYCODONE HCL 10 MG PO TB12
10.0000 mg | ORAL_TABLET | Freq: Two times a day (BID) | ORAL | Status: DC
  Administered 2011-01-19 – 2011-01-20 (×3): 10 mg via ORAL
  Filled 2011-01-19 (×3): qty 1

## 2011-01-19 MED ORDER — NICOTINE 7 MG/24HR TD PT24
7.0000 mg | MEDICATED_PATCH | Freq: Every day | TRANSDERMAL | Status: DC
  Administered 2011-01-19 – 2011-01-20 (×3): 7 mg via TRANSDERMAL
  Filled 2011-01-19 (×3): qty 1

## 2011-01-19 NOTE — Progress Notes (Signed)
Physical Therapy Evaluation Patient Details Name: Tiffany Romero MRN: [id number] DOB: 1970/08/24 Today's Date: 01/19/2011  Problem List:  Patient Active Problem List  Diagnoses  . Arthritis of hip Right DDH    Past Medical History:  Past Medical History  Diagnosis Date  . PONV (postoperative nausea and vomiting)   . Headache     migraines related to stress  . Anxiety     takes lexapro   Past Surgical History:  Past Surgical History  Procedure Date  . Cesarean section     1989  . Anterior cruciate ligament repair   . Knee arthroscopy   . Tubal ligation     1996  . Abdominal hysterectomy     2007  . Cervix surgery     PT Assessment/Plan/Recommendation PT Assessment Clinical Impression Statement: 41 yo female s/p right THA presents with decr mobility and impairments listed below; will benefit from acute PT services to maximize independence and safety with mobility, amb, steps, therex prior to dc home PT Recommendation/Assessment: Patient will need skilled PT in the acute care venue PT Problem List: Decreased strength;Decreased range of motion;Decreased activity tolerance;Decreased mobility;Decreased knowledge of use of DME;Decreased knowledge of precautions;Pain PT Therapy Diagnosis : Difficulty walking;Acute pain PT Plan PT Frequency: 7X/week PT Treatment/Interventions: DME instruction;Gait training;Stair training;Functional mobility training;Therapeutic activities;Therapeutic exercise;Patient/family education PT Recommendation Recommendations for Other Services: OT consult Follow Up Recommendations: Home health PT;Supervision - Intermittent Equipment Recommended: Rolling walker with 5" wheels;3 in 1 bedside comode PT Goals  Acute Rehab PT Goals PT Goal Formulation: With patient Time For Goal Achievement: 7 days Pt will go Supine/Side to Sit: with modified independence PT Goal: Supine/Side to Sit - Progress: Other (comment) Pt will go Sit to Supine/Side: with  modified independence PT Goal: Sit to Supine/Side - Progress: Other (comment) Pt will go Sit to Stand: with modified independence PT Goal: Sit to Stand - Progress: Other (comment) Pt will go Stand to Sit: with modified independence PT Goal: Stand to Sit - Progress: Other (comment) Pt will Ambulate: >150 feet;with modified independence;with least restrictive assistive device (correctly maintaining post hip prec) PT Goal: Ambulate - Progress: Other (comment) Pt will Go Up / Down Stairs: 3-5 stairs;with modified independence;with rail(s) PT Goal: Up/Down Stairs - Progress: Other (comment) Pt will Perform Home Exercise Program: Independently PT Goal: Perform Home Exercise Program - Progress: Other (comment)  PT Evaluation Precautions/Restrictions  Precautions Precautions: Posterior Hip Restrictions RLE Weight Bearing: Weight bearing as tolerated Prior Functioning  Home Living Lives With: Significant other (parents "in-law" next door) Receives Help From: Family Type of Home: Mobile home Home Layout: One level Home Access: Stairs to enter Entrance Stairs-Rails: Right;Left;Can reach both Entrance Stairs-Number of Steps: 5 Bathroom Shower/Tub: Walk-in shower;Tub/shower unit Bathroom Toilet: Standard Home Adaptive Equipment: Tub transfer bench;Straight cane Prior Function Level of Independence: Independent with basic ADLs;Independent with homemaking with ambulation Able to Take Stairs?: Yes Driving: Yes Cognition Cognition Arousal/Alertness: Awake/alert Overall Cognitive Status: Appears within functional limits for tasks assessed Orientation Level: Oriented X4 Sensation/Coordination Sensation Light Touch: Appears Intact Coordination Gross Motor Movements are Fluid and Coordinated: Yes Extremity Assessment RUE Assessment RUE Assessment: Within Functional Limits LUE Assessment LUE Assessment: Within Functional Limits RLE Assessment RLE Assessment: Exceptions to [facility] RLE  Strength RLE Overall Strength Comments: Grossly decr AROM right hip, limited by pain post op LLE Assessment LLE Assessment: Within Functional Limits Mobility (including Balance) Bed Mobility Bed Mobility: Yes Supine to Sit: 4: Min assist;HOB flat Supine to Sit Details (indicate cue  type and reason): cues for technique and to watch closely for hip internal rotation Sitting - Scoot to Edge of Bed: 4: Min assist Sitting - Scoot to [place] of Bed Details (indicate cue type and reason): cues to watch closely to avoid hip internal rotation Transfers Transfers: Yes Sit to Stand: 4: Min assist;From bed Sit to Stand Details (indicate cue type and reason): cues for post hip prec Stand to Sit: 4: Min assist;With upper extremity assist;To chair/3-in-1 Stand to Sit Details: cues for post hip prec Ambulation/Gait Ambulation/Gait: Yes Ambulation/Gait Assistance: 4: Min assist (with and without physical contact) Ambulation/Gait Assistance Details (indicate cue type and reason): cues for post hip prec, especially with turns; cues for sequence Ambulation Distance (Feet): 120 Feet Assistive device: Rolling walker Gait Pattern: Step-through pattern (emerging) Stairs: No Wheelchair Mobility Wheelchair Mobility: No    Exercise  Total Joint Exercises Quad Sets: AROM;Right;10 reps;Supine Gluteal Sets: AROM;Both;10 reps;Supine Heel Slides: AAROM;Right;10 reps;Supine Hip ABduction/ADduction: AAROM;Right;10 reps;Supine (limited range 2/2 pain) End of Session PT - End of Session Equipment Utilized During Treatment: Gait belt Activity Tolerance: Patient tolerated treatment well Patient left: in chair;with call bell in reach;with family/visitor present Nurse Communication: Mobility status for transfers;Mobility status for ambulation General Behavior During Session: [facility] for tasks performed Cognition: [facility] for tasks performed  Van Clines [facility] [place], [place] [phone]  01/19/2011, 2:41 PM

## 2011-01-19 NOTE — Progress Notes (Signed)
Patient ID: Tiffany Romero, female   DOB: [birth year], 41 y.o.   MRN: [id number] PATIENT ID: Tiffany Romero  MRN: [id number]  DOB/AGE:  1971/01/04 / 41 y.o.  1 Day Post-Op Procedure(s) (LRB): TOTAL HIP ARTHROPLASTY (Right)    PROGRESS NOTE Subjective: Patient is alert, oriented,noNausea, no Vomiting, no passing gas, no Bowel Movement. Taking PO well. Denies SOB, Chest or Calf Pain. Using Incentive Spirometer, PAS in place. Ambulate wbat today. Patient reports pain as 9 on 0-10 scale  .    Objective: Vital signs in last 24 hours: Filed Vitals:   01/18/11 1950 01/18/11 2200 01/19/11 0350 01/19/11 0635  BP:  108/56  110/58  Pulse:  76  96  Temp:  98.4 F (36.9 C)  97.9 F (36.6 C)  Resp:  18  18  Height: 5\' 8"  (1.727 m)  5\' 8"  (1.727 m)   Weight: 88 kg (194 lb 0.1 oz)  88 kg (194 lb 0.1 oz)   SpO2:  100%  99%      Intake/Output from previous day: I/O last 3 completed shifts: In: 6795 [P.O.:1120; I.V.:5675] Out: 2320 [Urine:1620; Blood:700]   Intake/Output this shift:     LABORATORY DATA:  Basename 01/19/11 0650  WBC 10.4  HGB 9.5*  HCT 28.4*  PLT 183  NA --  K --  CL --  CO2 --  BUN --  CREATININE --  GLUCOSE --  GLUCAP --  INR --  CALCIUM --    Examination: Neurologically intact ABD soft Neurovascular intact Sensation intact distally Intact pulses distally Dorsiflexion/Plantar flexion intact Incision: scant drainage No cellulitis present Compartment soft } XR AP&Lat of hip shows well placed\fixed THA  Assessment:   1 Day Post-Op Procedure(s) (LRB): TOTAL HIP ARTHROPLASTY (Right) ADDITIONAL DIAGNOSIS:  chronic pain  Plan: PT/OT WBAT, THA  posterior precautions  DVT Prophylaxis: Lovenox\Coumadin bridge, monitor INR 1.5-2.0 target  DISCHARGE PLAN: Home  DISCHARGE NEEDS: HHPT, HHRN, CPM, Walker and 3-in-1 comode seat  Will add oxycontin 10mg  BID for pain control.

## 2011-01-19 NOTE — Progress Notes (Signed)
Utilization review completed. Crescentia Boutwell, RN, BSN. 01/19/11  

## 2011-01-19 NOTE — Progress Notes (Signed)
  CARE MANAGEMENT NOTE 01/19/2011  Discharge planning. Spoke with patient, preoperatively setup with [facility] for home health, no changes, ordering rolling walker and 3in1.

## 2011-01-19 NOTE — Progress Notes (Signed)
Occupational Therapy Evaluation Patient Details Name: Tiffany Romero MRN: [id number] DOB: [birth year] Today's Date: 01/19/2011  Problem List:  Patient Active Problem List  Diagnoses  . Arthritis of hip Right DDH    Past Medical History:  Past Medical History  Diagnosis Date  . PONV (postoperative nausea and vomiting)   . Headache     migraines related to stress  . Anxiety     takes lexapro   Past Surgical History:  Past Surgical History  Procedure Date  . Cesarean section     1989  . Anterior cruciate ligament repair   . Knee arthroscopy   . Tubal ligation     1996  . Abdominal hysterectomy     2007  . Cervix surgery     OT Assessment/Plan/Recommendation OT Assessment Clinical Impression Statement: t s/p RLE THA. Pt/family educated in [place] [facility] and DME. Pt @ Mod I level with AE. son verbalized understanding of  use of THE precautions (post). Eval only./ OT Recommendation/Assessment: Patient does not need any further OT services OT Recommendation Follow Up Recommendations: No OT follow up Equipment Recommended: Rolling walker with 5" wheels;3 in 1 bedside comode OT Goals Acute Rehab OT Goals OT Goal Formulation:  (eval only)  OT Evaluation Precautions/Restrictions  Precautions Precautions: Posterior Hip Restrictions Weight Bearing Restrictions: Yes RLE Weight Bearing: Weight bearing as tolerated Prior Functioning Home Living Lives With: Significant other;Son Sisters Help From: Family Type of Home: Mobile home Home Layout: One level Home Access: Stairs to enter Entrance Stairs-Rails: Right;Left;Can reach both Entrance Stairs-Number of Steps: 5 Bathroom Shower/Tub: Walk-in shower;Tub/shower unit Bathroom Toilet: Standard Home Adaptive Equipment: Tub transfer bench;Straight cane Prior Function Level of Independence: Independent with basic ADLs;Independent with homemaking with ambulation Able to Take Stairs?: Yes Driving: Yes ADL ADL Eating/Feeding:  Performed;Independent Where Assessed - Eating/Feeding: Chair Grooming: Performed;Wash/dry hands;Wash/dry face;Independent Where Assessed - Grooming: Standing at sink Upper Body Bathing: Simulated;Chest;Right arm;Left arm;Abdomen;Set up Where Assessed - Upper Body Bathing: Standing at sink Lower Body Bathing: Moderate assistance Lower Body Bathing Details (indicate cue type and reason):  (Mod I with AE) Where Assessed - Lower Body Bathing: Sit to stand from chair Upper Body Dressing: Set up Where Assessed - Upper Body Dressing: Sitting, chair Lower Body Dressing: Performed;Moderate assistance Where Assessed - Lower Body Dressing: Sitting, chair;Standing Toilet Transfer: Performed;Supervision/safety Toilet Transfer Method: Proofreader: Bedside commode Toileting - Clothing Manipulation: Independent Where Assessed - Toileting Clothing Manipulation: Standing Toileting - Hygiene: Independent Where Assessed - Toileting Hygiene: Standing Tub/Shower Transfer: Landscape architect Method: Science writer: Walk in shower Equipment Used: Rolling walker;Long-handled shoe horn;Long-handled sponge;Reacher;Sock aid ADL Comments: Educated pt on use of AE. Pt has purchased hip kit. Pt 's son also educated. Pt @ Mod I level with AE and DME> Vision/Perception  Vision - History Baseline Vision: No visual deficits   End of Session OT - End of Session Equipment Utilized During Treatment: Gait belt Activity Tolerance: Patient tolerated treatment well Patient left: in chair;with call bell in reach;with family/visitor present Nurse Communication: Mobility status for transfers General Behavior During Session: [facility] for tasks performed Cognition: [facility] for tasks performed   Jolanta Cabeza,HILLARY 01/19/2011, 1:50 PM  [facility], OTR/L  [phone] 01/19/2011

## 2011-01-19 NOTE — Progress Notes (Signed)
ANTICOAGULATION CONSULT NOTE - Follow Up Consult  Pharmacy Consult for: Coumadin Indication: VTE px s/p R THA  No Known Allergies  Patient Measurements: Height: 5\' 8"  (172.7 cm) Weight: 194 lb 0.1 oz (88 kg) IBW/kg (Calculated) : 63.9   Vital Signs: Temp: 97.9 F (36.6 C) (01/08 0635) BP: 110/58 mmHg (01/08 0635) Pulse Rate: 96  (01/08 0635)  Labs:  Basename 01/19/11 0650  HGB 9.5*  HCT 28.4*  PLT 183  APTT --  LABPROT 13.8  INR 1.04  HEPARINUNFRC --  CREATININE 0.59  CKTOTAL --  CKMB --  TROPONINI --   Estimated Creatinine Clearance: 107.4 ml/min (by C-G formula based on Cr of 0.59).  Assessment: 40yof on coumadin for VTE px s/p R THA. INR of 1.04 remains subtherapeutic after first dose of coumadin as expected.  Goal of Therapy:  INR 2-3   Plan:  1) Repeat coumadin 7.5mg  x 1 2) Follow up INR in AM 3) Continue lovenox 40mg  q24 until INR >2  Fredrik Rigger 01/19/2011,10:13 AM

## 2011-01-20 LAB — CBC
MCH: 30.3 pg (ref 26.0–34.0)
MCHC: 32.8 g/dL (ref 30.0–36.0)
MCV: 92.4 fL (ref 78.0–100.0)
Platelets: 142 10*3/uL — ABNORMAL LOW (ref 150–400)
RDW: 14 % (ref 11.5–15.5)

## 2011-01-20 LAB — PROTIME-INR: Prothrombin Time: 17.9 seconds — ABNORMAL HIGH (ref 11.6–15.2)

## 2011-01-20 MED ORDER — WARFARIN SODIUM 5 MG PO TABS: 5.0000 mg | ORAL_TABLET | Freq: Every day | ORAL | Status: DC

## 2011-01-20 MED ORDER — WARFARIN SODIUM 5 MG PO TABS: ORAL_TABLET | ORAL | Status: DC

## 2011-01-20 MED ORDER — OXYCODONE HCL 5 MG PO TABS: 5.0000 mg | ORAL_TABLET | ORAL | Status: DC | PRN

## 2011-01-20 MED ORDER — OXYCODONE HCL 20 MG PO TB12: 20.0000 mg | ORAL_TABLET | Freq: Two times a day (BID) | ORAL | Status: AC

## 2011-01-20 MED ORDER — OXYCODONE-ACETAMINOPHEN 5-325 MG PO TABS: 1.0000 | ORAL_TABLET | Freq: Four times a day (QID) | ORAL | Status: AC | PRN

## 2011-01-20 MED ORDER — WARFARIN SODIUM 5 MG PO TABS
5.0000 mg | ORAL_TABLET | Freq: Once | ORAL | Status: DC
  Filled 2011-01-20: qty 1

## 2011-01-20 NOTE — Progress Notes (Signed)
ANTICOAGULATION CONSULT NOTE - Follow Up Consult  Pharmacy Consult for Coumadin Indication: VTE px s/p R-THA  No Known Allergies  Vital Signs: Temp: 98.8 F (37.1 C) (01/09 0518) Temp src: Oral (01/09 0518) BP: 95/63 mmHg (01/09 0518) Pulse Rate: 87  (01/09 0518)  Labs:  Basename 01/20/11 0608 01/19/11 0650  HGB 8.0* 9.5*  HCT 24.4* 28.4*  PLT 142* 183  APTT -- --  LABPROT 17.9* 13.8  INR 1.45 1.04  HEPARINUNFRC -- --  CREATININE -- 0.59  CKTOTAL -- --  CKMB -- --  TROPONINI -- --   Estimated Creatinine Clearance: 107.4 ml/min (by C-G formula based on Cr of 0.59).   Assessment: 41yo female s/p R-THA, on Coumadin for VTE px.  INR approaching goal at 1.45 this AM.  Hg is down this AM, but no signs of bleeding.    Goal of Therapy:  INR 1.5-2   Plan:  1.  Coumadin 5mg  today 2.  F/u AM  Jil Penland P 01/20/2011,10:11 AM

## 2011-01-20 NOTE — Discharge Summary (Signed)
Patient ID: Tiffany Romero MRN: [id number] DOB/AGE: 18-Sep-1970 41 y.o.  Admit date: 01/18/2011 Discharge date: 01/20/2011  Admission Diagnoses:  Principal Problem:  *Arthritis of hip Right DDH   Discharge Diagnoses:  Same  Past Medical History  Diagnosis Date  . PONV (postoperative nausea and vomiting)   . Headache     migraines related to stress  . Anxiety     takes lexapro    Surgeries: Procedure(s): TOTAL HIP ARTHROPLASTY on 01/18/2011   Consultants:    Discharged Condition: Improved  Hospital Course: NIZHONI PARLOW is an 41 y.o. female who was admitted 01/18/2011 for operative treatment ofArthritis of hip. Patient has severe unremitting pain that affects sleep, daily activities, and work/hobbies. After pre-op clearance the patient was taken to the operating room on 01/18/2011 and underwent  Procedure(s): TOTAL HIP ARTHROPLASTY.    Patient was given perioperative antibiotics: Anti-infectives     Start     Dose/Rate Route Frequency Ordered Stop   01/18/11 1057   ceFAZolin (ANCEF) IVPB 1 g/50 mL premix  Status:  Discontinued        1 g 100 mL/hr over 30 Minutes Intravenous 60 min pre-op 01/18/11 1057 01/18/11 1057   01/18/11 0600   ceFAZolin (ANCEF) IVPB 2 g/50 mL premix        2 g 100 mL/hr over 30 Minutes Intravenous On call to O.R. 01/17/11 1427 01/18/11 1210           Patient was given sequential compression devices, early ambulation, and chemoprophylaxis to prevent DVT.  Patient benefited maximally from hospital stay and there were no complications.    Recent vital signs: Patient Vitals for the past 24 hrs:  BP Temp Temp src Pulse Resp SpO2  01/20/11 1400 97/63 mmHg 98.5 F (36.9 C) - 86  16  97 %  01/20/11 0518 95/63 mmHg 98.8 F (37.1 C) Oral 87  16  96 %  01/20/2011 2145 105/72 mmHg 98.7 F (37.1 C) Oral 94  18  97 %     Recent laboratory studies:  Basename 01/20/11 0608 2011-01-20 0650  WBC 6.1 10.4  HGB 8.0* 9.5*  HCT 24.4* 28.4*  PLT 142* 183  NA  -- 138  K -- 4.2  CL -- 104  CO2 -- 30  BUN -- 6  CREATININE -- 0.59  GLUCOSE -- 133*  INR 1.45 1.04  CALCIUM -- 8.4     Discharge Medications:  Current Discharge Medication List    START taking these medications   Details  oxyCODONE (OXY IR/ROXICODONE) 5 MG immediate release tablet Take 1-2 tablets (5-10 mg total) by mouth every 3 (three) hours as needed. Qty: 60 tablet, Refills: 0    warfarin (COUMADIN) 5 MG tablet Take as directed by [facility] based on INR target 1.5-2.0. Qty: 30 tablet, Refills: 0      CONTINUE these medications which have NOT CHANGED   Details  cyclobenzaprine (FLEXERIL) 10 MG tablet Take 10 mg by mouth 3 (three) times daily.      escitalopram (LEXAPRO) 10 MG tablet Take 10 mg by mouth at bedtime.      HYDROcodone-acetaminophen (NORCO) 5-325 MG per tablet Take 1-2 tablets by mouth every 4 (four) hours as needed. For pain     rizatriptan (MAXALT) 10 MG tablet Take 10 mg by mouth as needed. May repeat in 2 hours if needed. For migraines.         Diagnostic Studies: Dg Pelvis Portable  01/18/2011  *RADIOLOGY REPORT*  Clinical Data:  Postop right total hip.  PORTABLE PELVIS  Comparison: None.  Findings: Changes of right total hip replacement.  Normal AP alignment.  No hardware or bony complicating feature.  Soft tissue gas noted.  IMPRESSION: Right hip replacement.  No complicating feature.  Original Report Authenticated By: Cyndie Chime, M.D.    Disposition: Final discharge disposition not confirmed  Discharge Orders    Future Orders Please Complete By Expires   Diet - low sodium heart healthy      Call MD / Call 911      Comments:   If you experience chest pain or shortness of breath, CALL 911 and be transported to the hospital emergency room.  If you develope a fever above 101 F, pus (white drainage) or increased drainage or redness at the wound, or calf pain, call your surgeon's office.   Constipation Prevention      Comments:   Drink plenty of  fluids.  Prune juice may be helpful.  You may use a stool softener, such as Colace (over the counter) 100 mg twice a day.  Use MiraLax (over the counter) for constipation as needed.   Increase activity slowly as tolerated      Weight Bearing as taught in Physical Therapy      Comments:   Use a walker or crutches as instructed.   Patient may shower      Comments:   You may shower without a dressing once there is no drainage.  Do not wash over the wound.  If drainage remains, cover wound with plastic wrap and then shower.   Driving restrictions      Comments:   No driving for 2 weeks   Change dressing      Comments:   Change dressing on POD #5.  You may clean the incision with alcohol prior to redressing.   Do not put a pillow under the knee. Place it under the heel.      Follow the hip precautions as taught in Physical Therapy      Do not sit on low chairs, stoools or toilet seats, as it may be difficult to get up from low surfaces      DO NOT drive, shower or take a tub bath until instructed by your physician      Discharge wound care:      Comments:   If you have a hip bandage, keep it clean and dry.  Change your bandage as instructed by your health care providers.  If your bandage has been discontinued, keep your incision clean and dry.  Pat dry after bathing.  DO NOT put lotion or powder on your incision.      Follow-up Information    Follow up with [facility].   Contact information:   [facility] [address] [place] [address] [phone]           Signed: [name] 01/20/2011, 3:51 PM

## 2011-01-20 NOTE — Progress Notes (Signed)
Patient ID: Tiffany Romero, female   DOB: 06-21-1970, 41 y.o.   MRN: [id number] PATIENT ID: Tiffany Romero  MRN: [id number]  DOB/AGE:  11/04/1970 / 41 y.o.  2 Days Post-Op Procedure(s) (LRB): TOTAL HIP ARTHROPLASTY (Right)    PROGRESS NOTE Subjective: Patient is alert, oriented,noNausea, no Vomiting, yes passing gas, no Bowel Movement. Taking PO well2. Denies SOB, Chest or Calf Pain. Using Incentive Spirometer, PAS in place. Ambulate in hallway, no stairs. Patient reports pain as 7 on 0-10 scale  .    Objective: Vital signs in last 24 hours: Filed Vitals:   01/19/11 0635 01/19/11 1400 01/19/11 2145 01/20/11 0518  BP: 110/58 107/68 105/72 95/63  Pulse: 96 85 94 87  Temp: 97.9 F (36.6 C) 98.6 F (37 C) 98.7 F (37.1 C) 98.8 F (37.1 C)  TempSrc:   Oral Oral  Resp: 18 16 18 16   Height:      Weight:      SpO2: 99% 100% 97% 96%      Intake/Output from previous day: I/O last 3 completed shifts: In: 4415.4 [P.O.:1480; I.V.:2935.4] Out: 1405 [Urine:1405]   Intake/Output this shift:     LABORATORY DATA:  Basename 01/20/11 0608 01/19/11 0650  WBC 6.1 10.4  HGB 8.0* 9.5*  HCT 24.4* 28.4*  PLT 142* 183  NA -- 138  K -- 4.2  CL -- 104  CO2 -- 30  BUN -- 6  CREATININE -- 0.59  GLUCOSE -- 133*  GLUCAP -- --  INR 1.45 1.04  CALCIUM -- 8.4    Examination: Neurologically intact ABD soft Neurovascular intact Sensation intact distally Intact pulses distally Dorsiflexion/Plantar flexion intact Incision: no drainage No cellulitis present Compartment soft} XR AP&Lat of hip shows well placed\fixed THA  Assessment:   2 Days Post-Op Procedure(s) (LRB): TOTAL HIP ARTHROPLASTY (Right)   Plan: PT/OT WBAT, THA  posterior precautions  DVT Prophylaxis: Lovenox\Coumadin bridge, monitor INR 1.5-2.0 target  DISCHARGE PLAN: Home  DISCHARGE NEEDS: HHPT, HHRN, CPM, Walker and 3-in-1 comode seat   Plan d/c tomorrow if passes PT.

## 2011-01-20 NOTE — Progress Notes (Signed)
Physical Therapy Treatment Patient Details Name: Tiffany Romero MRN: [id number] DOB: 1970/05/21 Today's Date: 01/20/2011  PT Assessment/Plan  PT - Assessment/Plan Comments on Treatment Session: excellent progress; ok for dc from PT standpoint PT Plan: Discharge plan remains appropriate PT Frequency: 7X/week Follow Up Recommendations: Home health PT;Supervision - Intermittent Equipment Recommended: Rolling walker with 5" wheels;3 in 1 bedside comode PT Goals  Acute Rehab PT Goals Time For Goal Achievement: 7 days Pt will go Supine/Side to Sit: with modified independence PT Goal: Supine/Side to Sit - Progress: Progressing toward goal Pt will go Sit to Supine/Side: with modified independence PT Goal: Sit to Supine/Side - Progress: Other (comment) Pt will go Sit to Stand: with modified independence PT Goal: Sit to Stand - Progress: Progressing toward goal Pt will go Stand to Sit: with modified independence PT Goal: Stand to Sit - Progress: Progressing toward goal Pt will Ambulate: >150 feet;with modified independence;with least restrictive assistive device PT Goal: Ambulate - Progress: Progressing toward goal Pt will Go Up / Down Stairs: 3-5 stairs;with modified independence;with rail(s) PT Goal: Up/Down Stairs - Progress: Progressing toward goal Pt will Perform Home Exercise Program: Independently PT Goal: Perform Home Exercise Program - Progress: Other (comment)  PT Treatment Precautions/Restrictions  Precautions Precautions: Posterior Hip Precaution Booklet Issued:  (Pt given precautions handout) Restrictions Weight Bearing Restrictions: Yes RLE Weight Bearing: Weight bearing as tolerated Mobility (including Balance) Bed Mobility Supine to Sit: 5: Supervision;HOB flat Supine to Sit Details (indicate cue type and reason): cues to monitor for hip precautions, especially internal rotation Sitting - Scoot to Edge of Bed: 5: Supervision Sitting - Scoot to Edge of Bed Details  (indicate cue type and reason): cues to monitor for hip precautions, especially internal rotation Transfers Sit to Stand: 5: Supervision;From bed Sit to Stand Details (indicate cue type and reason): smooth transition, cues to preposition for hip prec Stand to Sit: 5: Supervision;To chair/3-in-1;With upper extremity assist Stand to Sit Details: smooth transistion, good control of descent; cues to preposition for posterior hip prec Ambulation/Gait Ambulation/Gait Assistance: 5: Supervision (progressing to mod I) Ambulation/Gait Assistance Details (indicate cue type and reason): cues to monitor for hip precautions, especially internal rotation Ambulation Distance (Feet): 175 Feet Assistive device: Rolling walker Gait Pattern: Step-through pattern Stairs: Yes Stairs Assistance: 4: Min assist (without physical contact) Stairs Assistance Details (indicate cue type and reason): cues for sequence and technique Stair Management Technique: Two rails Number of Stairs: 5     Exercise    End of Session PT - End of Session Activity Tolerance: Patient tolerated treatment well Patient left: in chair;with call bell in reach Nurse Communication: Mobility status for transfers;Mobility status for ambulation General Behavior During Session: [facility] for tasks performed Cognition: [facility] for tasks performed  Van Clines [facility] [place], [place] [phone]  01/20/2011, 3:56 PM

## 2011-01-20 NOTE — Progress Notes (Signed)
Pt is s/p R THR. Hip precautions. +cms. Pt is WBAT with RW without difficulty. Pt ambulates with steady gait and one assist and RW. Pt has a mepilex to R hip that is stained with small amount of serosanquinous drainage. Pt lungs CTA. No s/sx resp or cardiac distress. Vital signs stable. Pt pain controlled. Pt abdomen soft flat nontender and nondistended. BS+x4. Pt denies nausea or vomiting and tolerates diet fair to good. Pt repts LBM 1/7. Pt voids without difficulty.

## 2011-01-20 NOTE — Progress Notes (Deleted)
ANTICOAGULATION CONSULT NOTE - Follow Up Consult  Pharmacy Consult for  Coumadin (+lovenox) Indication: VTE prophylaxis  No Known Allergies  Patient Measurements: Height: 5\' 8"  (172.7 cm) Weight: 194 lb 0.1 oz (88 kg) IBW/kg (Calculated) : 63.9    Vital Signs: Temp: 98.8 F (37.1 C) (01/09 0518) Temp src: Oral (01/09 0518) BP: 95/63 mmHg (01/09 0518) Pulse Rate: 87  (01/09 0518)  Labs:  Basename 01/20/11 0608 01/19/11 0650  HGB 8.0* 9.5*  HCT 24.4* 28.4*  PLT 142* 183  APTT -- --  LABPROT 17.9* 13.8  INR 1.45 1.04  HEPARINUNFRC -- --  CREATININE -- 0.59  CKTOTAL -- --  CKMB -- --  TROPONINI -- --   Estimated Creatinine Clearance: 107.4 ml/min (by C-G formula based on Cr of 0.59).   Medications:  Scheduled:    . cyclobenzaprine  10 mg Oral TID  . enoxaparin  40 mg Subcutaneous Q24H  . escitalopram  10 mg Oral QHS  . nicotine  7 mg Transdermal Daily  . oxyCODONE  10 mg Oral Q12H  . warfarin  7.5 mg Oral ONCE-1800  . warfarin   Does not apply Once   PRN: acetaminophen, acetaminophen, aluminum hydroxide, bisacodyl, diphenhydrAMINE, HYDROcodone-acetaminophen, HYDROmorphone, menthol-cetylpyridinium, metoCLOPramide (REGLAN) injection, metoCLOPramide, ondansetron (ZOFRAN) IV, ondansetron, oxyCODONE, phenol, senna-docusate, SUMAtriptan, zolpidem  Assessment: 41 yo female post right hip replacement.  Currently receiving lovenox 40mg  daily and warfarin 7.5mg .  Plan is for d/c tomorrow.  Continue ppx therapy. Patient has been educated on warfarin ppx.  Warfarin score = 6.  The 7.5mg  dose caused a robust rise in the INR.  Will reduce dose slightly.  Goal of Therapy:  INR goal of 1.5 - 2, per Dr. Wadie Lessen note.  Stop lovenox when INR >2.   Plan:  Coumadin 6mg  at 1800 Check INR in am.  Walden Field B 01/20/2011,10:07 AM

## 2011-01-30 ENCOUNTER — Emergency Department (HOSPITAL_COMMUNITY)
Admission: EM | Admit: 2011-01-30 | Discharge: 2011-01-30 | Disposition: A | Discharge status: Home / Self Care | Payer: Medicaid Other | Attending: Emergency Medicine | Admitting: Emergency Medicine

## 2011-01-30 ENCOUNTER — Encounter (HOSPITAL_COMMUNITY): Payer: Self-pay

## 2011-01-30 DIAGNOSIS — M79609 Pain in unspecified limb: Secondary | ICD-10-CM

## 2011-01-30 DIAGNOSIS — Z96649 Presence of unspecified artificial hip joint: Secondary | ICD-10-CM

## 2011-01-30 DIAGNOSIS — Z79899 Other long term (current) drug therapy: Secondary | ICD-10-CM | POA: Insufficient documentation

## 2011-01-30 DIAGNOSIS — Z7901 Long term (current) use of anticoagulants: Secondary | ICD-10-CM | POA: Insufficient documentation

## 2011-01-30 DIAGNOSIS — F411 Generalized anxiety disorder: Secondary | ICD-10-CM | POA: Insufficient documentation

## 2011-01-30 DIAGNOSIS — M7989 Other specified soft tissue disorders: Secondary | ICD-10-CM | POA: Insufficient documentation

## 2011-01-30 DIAGNOSIS — G43909 Migraine, unspecified, not intractable, without status migrainosus: Secondary | ICD-10-CM | POA: Insufficient documentation

## 2011-01-30 LAB — COMPREHENSIVE METABOLIC PANEL
Alkaline Phosphatase: 76 U/L (ref 39–117)
BUN: 9 mg/dL (ref 6–23)
CO2: 25 mEq/L (ref 19–32)
Chloride: 105 mEq/L (ref 96–112)
GFR calc Af Amer: >90 mL/min (ref >90)
GFR calc non Af Amer: >90 mL/min (ref >90)
Glucose, Bld: 95 mg/dL (ref 70–99)
Potassium: 4.1 mEq/L (ref 3.5–5.1)
Total Bilirubin: 0.3 mg/dL (ref 0.3–1.2)
Total Protein: 6.1 g/dL (ref 6.0–8.3)

## 2011-01-30 LAB — DIFFERENTIAL
Eosinophils Absolute: 0.3 10*3/uL (ref 0.0–0.7)
Lymphocytes Relative: 13 % (ref 12–46)
Lymphs Abs: 1 10*3/uL (ref 0.7–4.0)
Monocytes Relative: 6 % (ref 3–12)
Neutrophils Relative %: 77 % (ref 43–77)

## 2011-01-30 LAB — CBC
Hemoglobin: 8.9 g/dL — ABNORMAL LOW (ref 12.0–15.0)
MCH: 30.6 pg (ref 26.0–34.0)
RBC: 2.91 MIL/uL — ABNORMAL LOW (ref 3.87–5.11)

## 2011-01-30 LAB — APTT: aPTT: 40 seconds — ABNORMAL HIGH (ref 24–37)

## 2011-01-30 MED ORDER — OXYCODONE-ACETAMINOPHEN 5-325 MG PO TABS: 1.0000 | ORAL_TABLET | Freq: Four times a day (QID) | ORAL | Status: AC | PRN

## 2011-01-30 MED ORDER — HYDROMORPHONE HCL PF 1 MG/ML IJ SOLN
INTRAMUSCULAR | Status: AC
  Filled 2011-01-30: qty 1

## 2011-01-30 MED ORDER — HYDROMORPHONE HCL PF 1 MG/ML IJ SOLN
1.0000 mg | Freq: Once | INTRAMUSCULAR | Status: AC
  Administered 2011-01-30: 1 mg via INTRAVENOUS

## 2011-01-30 NOTE — ED Provider Notes (Signed)
History     CSN: [id number]  Arrival date & time 01/30/11  1208   First MD Initiated Contact with Patient 01/30/11 1215      No chief complaint on file.   (Consider location/radiation/quality/duration/timing/severity/associated sxs/prior treatment) HPI Comments: Patient is 10 days post op from total hip replacement.  Is on coumadin, unknown inr.  Patient is a 41 y.o. female presenting with leg pain. The history is provided by the patient.  Leg Pain  The incident occurred more than 2 days ago. The incident occurred at home. There was no injury mechanism. The pain is present in the right hip, right thigh and right leg. The quality of the pain is described as aching and throbbing. The pain is severe. The pain has been constant since onset. Associated symptoms include tingling. Pertinent negatives include no numbness, no inability to bear weight and no loss of sensation.    Past Medical History  Diagnosis Date  . PONV (postoperative nausea and vomiting)   . Headache     migraines related to stress  . Anxiety     takes lexapro    Past Surgical History  Procedure Date  . Cesarean section     1989  . Anterior cruciate ligament repair   . Knee arthroscopy   . Tubal ligation     1996  . Abdominal hysterectomy     2007  . Cervix surgery   . Total hip arthroplasty 01/18/2011    Procedure: TOTAL HIP ARTHROPLASTY;  Surgeon: [name];  Location: [facility] OR;  Service: Orthopedics;  Laterality: Right;  DEPUY/PINNICAL POLY    No family history on file.  History  Substance Use Topics  . Smoking status: Current Everyday Smoker -- 0.5 packs/day  . Smokeless tobacco: Not on file  . Alcohol Use: No    OB History    Grav Para Term Preterm Abortions TAB SAB Ect Mult Living                  Review of Systems  Neurological: Positive for tingling. Negative for numbness.  All other systems reviewed and are negative.    Allergies  Review of patient's allergies indicates no known  allergies.  Home Medications   Current Outpatient Rx  Name Route Sig Dispense Refill  . CYCLOBENZAPRINE HCL 10 MG PO TABS Oral Take 10 mg by mouth 3 (three) times daily.      Marland Kitchen ESCITALOPRAM OXALATE 10 MG PO TABS Oral Take 10 mg by mouth at bedtime.      Marland Kitchen HYDROCODONE-ACETAMINOPHEN 5-325 MG PO TABS Oral Take 1-2 tablets by mouth every 4 (four) hours as needed. For pain     . OXYCODONE HCL ER 20 MG PO TB12 Oral Take 1 tablet (20 mg total) by mouth every 12 (twelve) hours. 30 tablet 0  . OXYCODONE-ACETAMINOPHEN 5-325 MG PO TABS Oral Take 1-2 tablets by mouth every 6 (six) hours as needed for pain (breakthrough pain.). 50 tablet 0  . RIZATRIPTAN BENZOATE 10 MG PO TABS Oral Take 10 mg by mouth as needed. May repeat in 2 hours if needed. For migraines.     . WARFARIN SODIUM 5 MG PO TABS Oral Take 1 tablet (5 mg total) by mouth daily. 30 tablet 0    Or as directed based on INR target of 1.5-2.0.    There were no vitals taken for this visit.  Physical Exam  Nursing note and vitals reviewed. Constitutional: She is oriented to person, place, and time. She  appears well-developed and well-nourished.  HENT:  Head: Normocephalic and atraumatic.  Neck: Normal range of motion. Neck supple.  Cardiovascular: Normal rate and regular rhythm.   No murmur heard. Pulmonary/Chest: Effort normal and breath sounds normal. No respiratory distress.  Abdominal: Soft. Bowel sounds are normal. She exhibits no distension.  Musculoskeletal:       The rle is significantly swollen with edema, ecchymosis extending from foot into the right thigh.  The DP and PT pulses are intact.   Neurological: She is alert and oriented to person, place, and time.  Skin: Skin is warm and dry.    ED Course  Procedures (including critical care time)   Labs Reviewed  CBC  DIFFERENTIAL  COMPREHENSIVE METABOLIC PANEL  APTT  PROTIME-INR   No results found.   No diagnosis found.    MDM  The ultrasound does not show any  sign of DVT.  Her inr is subtherapeutic.  Will discharge with pain meds, and follow up as needed.  I spoke with Dr. Ave Filter from Orthopedics and informed him of the findings.  He recommends stopping the coumadin as well and seeing their orthopedist next week.          Geoffery Lyons, MD 01/30/11 [phone]

## 2011-01-30 NOTE — ED Notes (Signed)
Pt had total right hip replacement 12 days ago, hospitalized for 2 days. Now the right leg is swollen, painful (burning, sharp 10/10, and warm to the touch compared to the other extremity. Limited ROM  Noted. Pedal pulse present, sensation intact. Able to wriggle toes. Bluish discoloration noted on the toes. Denied numbness and tinggling sensation. Surgical site is clean, suture intact, no drainage noted.

## 2011-01-30 NOTE — ED Notes (Signed)
MD at bedside. 

## 2011-01-30 NOTE — ED Notes (Signed)
Patient is resting comfortably. 

## 2011-01-30 NOTE — Progress Notes (Signed)
*  PRELIMINARY RESULTS*   Right lower extremity venous Doppler completed.  There is no DVT or SVT noted in the right lower extremity.    Sherren Kerns Renee 01/30/2011, 1:28 PM

## 2011-01-30 NOTE — ED Notes (Signed)
Family at bedside. 

## 2011-01-30 NOTE — ED Notes (Signed)
Per had hip replacement in January, now area is swollen and painful

## 2011-01-30 NOTE — ED Notes (Signed)
Vital signs stable. 

## 2011-02-25 ENCOUNTER — Ambulatory Visit: Payer: Medicaid Other | Attending: Orthopedic Surgery | Admitting: Physical Therapy

## 2011-02-25 DIAGNOSIS — M25559 Pain in unspecified hip: Secondary | ICD-10-CM | POA: Insufficient documentation

## 2011-02-25 DIAGNOSIS — IMO0001 Reserved for inherently not codable concepts without codable children: Secondary | ICD-10-CM | POA: Insufficient documentation

## 2011-03-03 ENCOUNTER — Encounter: Payer: Medicaid Other | Admitting: Physical Therapy

## 2011-03-11 ENCOUNTER — Encounter: Payer: Medicaid Other | Admitting: Physical Therapy

## 2011-05-27 ENCOUNTER — Other Ambulatory Visit (HOSPITAL_COMMUNITY): Payer: Self-pay | Admitting: Pain Medicine

## 2011-05-27 DIAGNOSIS — M545 Low back pain, unspecified: Secondary | ICD-10-CM

## 2011-05-31 ENCOUNTER — Inpatient Hospital Stay (HOSPITAL_COMMUNITY): Admission: RE | Admit: 2011-05-31 | Payer: Medicaid Other | Source: Ambulatory Visit

## 2011-06-14 ENCOUNTER — Other Ambulatory Visit (HOSPITAL_COMMUNITY): Payer: Medicaid Other

## 2011-09-07 ENCOUNTER — Other Ambulatory Visit: Payer: Medicaid Other

## 2011-09-18 ENCOUNTER — Other Ambulatory Visit: Payer: Medicaid Other

## 2011-11-29 ENCOUNTER — Other Ambulatory Visit: Payer: Medicaid Other

## 2011-12-02 ENCOUNTER — Other Ambulatory Visit: Payer: Self-pay | Admitting: Pain Medicine

## 2011-12-02 ENCOUNTER — Ambulatory Visit
Admission: RE | Admit: 2011-12-02 | Discharge: 2011-12-02 | Disposition: A | Discharge status: Home / Self Care | Payer: Medicare Other | Source: Ambulatory Visit | Attending: Pain Medicine | Admitting: Pain Medicine

## 2011-12-02 DIAGNOSIS — M545 Low back pain, unspecified: Secondary | ICD-10-CM

## 2011-12-02 DIAGNOSIS — M79671 Pain in right foot: Secondary | ICD-10-CM

## 2011-12-02 DIAGNOSIS — M79673 Pain in unspecified foot: Secondary | ICD-10-CM

## 2012-01-18 ENCOUNTER — Other Ambulatory Visit: Payer: Self-pay | Admitting: Orthopedic Surgery

## 2012-01-20 ENCOUNTER — Encounter (HOSPITAL_BASED_OUTPATIENT_CLINIC_OR_DEPARTMENT_OTHER): Payer: Self-pay | Admitting: *Deleted

## 2012-01-20 NOTE — Progress Notes (Signed)
No labs needed

## 2012-01-21 NOTE — H&P (Signed)
Tiffany Romero is an 42 y.o. female.   Chief Complaint: left wrist pain, numbness in left hand   HPI:  The patient complains of pain in her left wrist.  The pain began in April after doing a lot of yard work.  She goes to pain management and has had cortisone injections in the wrist and the elbow.  She got minimal improvement after each injection.  She had nerve conduction studies that showed carpal tunnel syndrome, but unfortunately we do not have those records yet.  She complains of numbness and tingling in her fingers that will wake her from sleep at night.  She reports that her index finger is always numb.  Past Medical History  Diagnosis Date  . PONV (postoperative nausea and vomiting)   . Headache     migraines related to stress  . Anxiety     takes lexapro  . Carpal tunnel syndrome, bilateral   . Arthritis     djd    Past Surgical History  Procedure Date  . Cesarean section     1989  . Anterior cruciate ligament repair   . Knee arthroscopy   . Tubal ligation     1996  . Abdominal hysterectomy     2007  . Cervix surgery   . Total hip arthroplasty 01/18/2011    Procedure: TOTAL HIP ARTHROPLASTY;  Surgeon: [name];  Location: [facility] OR;  Service: Orthopedics;  Laterality: Right;  DEPUY/PINNICAL POLY    History reviewed. No pertinent family history. Social History:  reports that she has been smoking.  She does not have any smokeless tobacco history on file. She reports that she drinks alcohol. She reports that she does not use illicit drugs.  Allergies:  Allergies  Allergen Reactions  . Coumadin (Warfarin Sodium) Swelling    No prescriptions prior to admission    No results found for this or any previous visit (from the past 48 hour(s)). No results found.  Review of Systems  Constitutional: Negative.   HENT: Negative.   Eyes: Negative.   Respiratory: Positive for cough.   Gastrointestinal: Negative.   Genitourinary: Negative.   Musculoskeletal: Positive for  joint pain.  Skin: Negative.   Neurological: Negative.   Endo/Heme/Allergies: Bruises/bleeds easily.  Psychiatric/Behavioral: Negative.     Height 5\' 8"  (1.727 m), weight 77.111 kg (170 lb). Physical Exam  Constitutional: She is oriented to person, place, and time. She appears well-developed and well-nourished.  HENT:  Head: Normocephalic.  Eyes: Pupils are equal, round, and reactive to light.  Cardiovascular: Normal heart sounds.   Respiratory: Breath sounds normal.  GI: Bowel sounds are normal.  Musculoskeletal: She exhibits edema.  Neurological: She is alert and oriented to person, place, and time.  Psychiatric: She has a normal mood and affect.    Exam of the left wrist demonstrates a positive median nerve compression test.  She has pain with flexion and extension of the wrist.  No obvious swelling or erythema is noted.  Nerve conduction studies demonstrate carpal tunnel syndrome.  Assessment/Plan A: carpal tunnel syndrome of the left wrist  Plan:.   We've discussed the risks and benefits of a carpal tunnel release and the patient would like to proceed.  She was given a wrist splint to wear for now.   [name] M. 01/21/2012, 4:57 PM

## 2012-01-24 ENCOUNTER — Encounter (HOSPITAL_BASED_OUTPATIENT_CLINIC_OR_DEPARTMENT_OTHER): Payer: Self-pay | Admitting: Anesthesiology

## 2012-01-24 ENCOUNTER — Encounter (HOSPITAL_BASED_OUTPATIENT_CLINIC_OR_DEPARTMENT_OTHER): Payer: Self-pay | Admitting: *Deleted

## 2012-01-24 ENCOUNTER — Ambulatory Visit (HOSPITAL_BASED_OUTPATIENT_CLINIC_OR_DEPARTMENT_OTHER)
Admission: RE | Admit: 2012-01-24 | Discharge: 2012-01-24 | Disposition: A | Discharge status: Home / Self Care | Payer: Medicare Other | Source: Ambulatory Visit | Attending: Orthopedic Surgery | Admitting: Orthopedic Surgery

## 2012-01-24 ENCOUNTER — Ambulatory Visit (HOSPITAL_BASED_OUTPATIENT_CLINIC_OR_DEPARTMENT_OTHER): Payer: Medicare Other | Admitting: Anesthesiology

## 2012-01-24 ENCOUNTER — Encounter (HOSPITAL_BASED_OUTPATIENT_CLINIC_OR_DEPARTMENT_OTHER)
Admission: RE | Disposition: A | Discharge status: Home / Self Care | Payer: Self-pay | Source: Ambulatory Visit | Attending: Orthopedic Surgery

## 2012-01-24 DIAGNOSIS — Z96649 Presence of unspecified artificial hip joint: Secondary | ICD-10-CM | POA: Insufficient documentation

## 2012-01-24 DIAGNOSIS — F17200 Nicotine dependence, unspecified, uncomplicated: Secondary | ICD-10-CM | POA: Insufficient documentation

## 2012-01-24 DIAGNOSIS — G5600 Carpal tunnel syndrome, unspecified upper limb: Secondary | ICD-10-CM | POA: Insufficient documentation

## 2012-01-24 DIAGNOSIS — G5602 Carpal tunnel syndrome, left upper limb: Secondary | ICD-10-CM

## 2012-01-24 HISTORY — DX: Unspecified osteoarthritis, unspecified site: M19.90

## 2012-01-24 HISTORY — DX: Carpal tunnel syndrome, bilateral upper limbs: G56.03

## 2012-01-24 HISTORY — PX: CARPAL TUNNEL RELEASE: SHX101

## 2012-01-24 SURGERY — CARPAL TUNNEL RELEASE
Anesthesia: General | Site: Wrist | Laterality: Left | Wound class: Clean

## 2012-01-24 MED ORDER — MIDAZOLAM HCL 5 MG/5ML IJ SOLN
INTRAMUSCULAR | Status: DC | PRN
  Administered 2012-01-24: 2 mg via INTRAVENOUS

## 2012-01-24 MED ORDER — ONDANSETRON HCL 4 MG/2ML IJ SOLN: 4.0000 mg | Freq: Once | INTRAMUSCULAR | Status: DC | PRN

## 2012-01-24 MED ORDER — FENTANYL CITRATE 0.05 MG/ML IJ SOLN
INTRAMUSCULAR | Status: DC | PRN
  Administered 2012-01-24 (×2): 25 ug via INTRAVENOUS
  Administered 2012-01-24: 100 ug via INTRAVENOUS

## 2012-01-24 MED ORDER — LIDOCAINE HCL (CARDIAC) 20 MG/ML IV SOLN
INTRAVENOUS | Status: DC | PRN
  Administered 2012-01-24: 100 mg via INTRAVENOUS

## 2012-01-24 MED ORDER — OXYCODONE-ACETAMINOPHEN 10-325 MG PO TABS: 1.0000 | ORAL_TABLET | ORAL | Status: DC | PRN

## 2012-01-24 MED ORDER — DEXTROSE-NACL 5-0.45 % IV SOLN: INTRAVENOUS | Status: DC

## 2012-01-24 MED ORDER — HYDROMORPHONE HCL PF 1 MG/ML IJ SOLN
0.2500 mg | INTRAMUSCULAR | Status: DC | PRN
  Administered 2012-01-24: 0.5 mg via INTRAVENOUS
  Administered 2012-01-24 (×2): 0.25 mg via INTRAVENOUS
  Administered 2012-01-24 (×2): 0.5 mg via INTRAVENOUS

## 2012-01-24 MED ORDER — DEXAMETHASONE SODIUM PHOSPHATE 4 MG/ML IJ SOLN
INTRAMUSCULAR | Status: DC | PRN
  Administered 2012-01-24: 8 mg via INTRAVENOUS

## 2012-01-24 MED ORDER — OXYCODONE HCL 5 MG PO TABS
5.0000 mg | ORAL_TABLET | Freq: Once | ORAL | Status: AC | PRN
  Administered 2012-01-24: 5 mg via ORAL

## 2012-01-24 MED ORDER — OXYCODONE HCL 5 MG/5ML PO SOLN: 5.0000 mg | Freq: Once | ORAL | Status: AC | PRN

## 2012-01-24 MED ORDER — SCOPOLAMINE 1 MG/3DAYS TD PT72
1.0000 | MEDICATED_PATCH | TRANSDERMAL | Status: DC
Start: 2012-01-24 — End: 2012-01-24
  Administered 2012-01-24: 1.5 mg via TRANSDERMAL

## 2012-01-24 MED ORDER — BUPIVACAINE HCL (PF) 0.5 % IJ SOLN
INTRAMUSCULAR | Status: DC | PRN
  Administered 2012-01-24: 4 mL

## 2012-01-24 MED ORDER — ONDANSETRON HCL 4 MG/2ML IJ SOLN
INTRAMUSCULAR | Status: DC | PRN
  Administered 2012-01-24: 4 mg via INTRAVENOUS

## 2012-01-24 MED ORDER — PROPOFOL 10 MG/ML IV BOLUS
INTRAVENOUS | Status: DC | PRN
  Administered 2012-01-24: 200 mg via INTRAVENOUS
  Administered 2012-01-24: 100 mg via INTRAVENOUS

## 2012-01-24 MED ORDER — CEFAZOLIN SODIUM-DEXTROSE 2-3 GM-% IV SOLR
2.0000 g | INTRAVENOUS | Status: AC
  Administered 2012-01-24: 2 g via INTRAVENOUS

## 2012-01-24 MED ORDER — LACTATED RINGERS IV SOLN
INTRAVENOUS | Status: DC
  Administered 2012-01-24 (×2): via INTRAVENOUS

## 2012-01-24 SURGICAL SUPPLY — 37 items
BLADE SURG 15 STRL LF DISP TIS (BLADE) ×1 IMPLANT
BLADE SURG 15 STRL SS (BLADE) ×1
BNDG ELASTIC 2 VLCR STRL LF (GAUZE/BANDAGES/DRESSINGS) ×2 IMPLANT
BNDG ESMARK 4X9 LF (GAUZE/BANDAGES/DRESSINGS) ×2 IMPLANT
CHLORAPREP W/TINT 26ML (MISCELLANEOUS) ×2 IMPLANT
CLOTH BEACON ORANGE TIMEOUT ST (SAFETY) ×2 IMPLANT
CORDS BIPOLAR (ELECTRODE) ×2 IMPLANT
COVER MAYO STAND STRL (DRAPES) ×2 IMPLANT
COVER TABLE BACK 60X90 (DRAPES) ×2 IMPLANT
CUFF TOURNIQUET SINGLE 18IN (TOURNIQUET CUFF) ×2 IMPLANT
DRAPE EXTREMITY T 121X128X90 (DRAPE) ×2 IMPLANT
DRAPE U-SHAPE 47X51 STRL (DRAPES) IMPLANT
GAUZE SPONGE 4X4 12PLY STRL LF (GAUZE/BANDAGES/DRESSINGS) ×4 IMPLANT
GAUZE XEROFORM 1X8 LF (GAUZE/BANDAGES/DRESSINGS) ×2 IMPLANT
GLOVE BIO SURGEON STRL SZ7 (GLOVE) ×4 IMPLANT
GLOVE BIO SURGEON STRL SZ7.5 (GLOVE) ×2 IMPLANT
GLOVE BIOGEL PI IND STRL 7.0 (GLOVE) ×1 IMPLANT
GLOVE BIOGEL PI IND STRL 7.5 (GLOVE) ×1 IMPLANT
GLOVE BIOGEL PI IND STRL 8 (GLOVE) ×1 IMPLANT
GLOVE BIOGEL PI INDICATOR 7.0 (GLOVE) ×1
GLOVE BIOGEL PI INDICATOR 7.5 (GLOVE) ×1
GLOVE BIOGEL PI INDICATOR 8 (GLOVE) ×1
GOWN PREVENTION PLUS XLARGE (GOWN DISPOSABLE) ×2 IMPLANT
NEEDLE HYPO 22GX1.5 SAFETY (NEEDLE) ×2 IMPLANT
NS IRRIG 1000ML POUR BTL (IV SOLUTION) ×2 IMPLANT
PACK BASIN DAY SURGERY FS (CUSTOM PROCEDURE TRAY) ×2 IMPLANT
PADDING CAST ABS 4INX4YD NS (CAST SUPPLIES) ×1
PADDING CAST ABS COTTON 4X4 ST (CAST SUPPLIES) ×1 IMPLANT
PADDING UNDERCAST 2  STERILE (CAST SUPPLIES) ×2 IMPLANT
SLEEVE SCD COMPRESS KNEE MED (MISCELLANEOUS) IMPLANT
SUT ETHILON 4 0 PS 2 18 (SUTURE) ×2 IMPLANT
SUT MON AB 4-0 PC3 18 (SUTURE) ×2 IMPLANT
SYR BULB 3OZ (MISCELLANEOUS) ×2 IMPLANT
SYR CONTROL 10ML LL (SYRINGE) ×2 IMPLANT
TOWEL OR 17X24 6PK STRL BLUE (TOWEL DISPOSABLE) ×2 IMPLANT
UNDERPAD 30X30 INCONTINENT (UNDERPADS AND DIAPERS) ×2 IMPLANT
WATER STERILE IRR 1000ML POUR (IV SOLUTION) IMPLANT

## 2012-01-24 NOTE — Anesthesia Postprocedure Evaluation (Signed)
  Anesthesia Post-op Note  Patient: Tiffany Romero  Procedure(s) Performed: Procedure(s) (LRB) with comments: CARPAL TUNNEL RELEASE (Left)  Patient Location: PACU  Anesthesia Type:General  Level of Consciousness: awake, alert  and oriented  Airway and Oxygen Therapy: Patient Spontanous Breathing and Patient connected to face mask oxygen  Post-op Pain: mild  Post-op Assessment: Post-op Vital signs reviewed  Post-op Vital Signs: Reviewed  Complications: No apparent anesthesia complications

## 2012-01-24 NOTE — Transfer of Care (Signed)
Immediate Anesthesia Transfer of Care Note  Patient: Tiffany Romero  Procedure(s) Performed: Procedure(s) (LRB) with comments: CARPAL TUNNEL RELEASE (Left)  Patient Location: PACU  Anesthesia Type:General  Level of Consciousness: sedated and unresponsive  Airway & Oxygen Therapy: Patient Spontanous Breathing and Patient connected to face mask oxygen  Post-op Assessment: Report given to PACU RN and Post -op Vital signs reviewed and stable  Post vital signs: Reviewed and stable  Complications: No apparent anesthesia complications

## 2012-01-24 NOTE — Anesthesia Preprocedure Evaluation (Signed)
Anesthesia Evaluation  Patient identified by MRN, date of birth, ID band Patient awake    Reviewed: Allergy & Precautions, H&P , NPO status , Patient's Chart, lab work & pertinent test results  History of Anesthesia Complications (+) PONV  Airway Mallampati: I TM Distance: >3 FB Neck ROM: Full    Dental  (+) Teeth Intact, Upper Dentures and Dental Advisory Given   Pulmonary  breath sounds clear to auscultation        Cardiovascular Rhythm:Regular Rate:Normal     Neuro/Psych    GI/Hepatic   Endo/Other    Renal/GU      Musculoskeletal   Abdominal   Peds  Hematology   Anesthesia Other Findings   Reproductive/Obstetrics                           Anesthesia Physical Anesthesia Plan  ASA: I  Anesthesia Plan: General   Post-op Pain Management:    Induction: Intravenous  Airway Management Planned: LMA  Additional Equipment:   Intra-op Plan:   Post-operative Plan: Extubation in OR  Informed Consent: I have reviewed the patients History and Physical, chart, labs and discussed the procedure including the risks, benefits and alternatives for the proposed anesthesia with the patient or authorized representative who has indicated his/her understanding and acceptance.   Dental advisory given  Plan Discussed with: CRNA, Anesthesiologist and Surgeon  Anesthesia Plan Comments:         Anesthesia Quick Evaluation

## 2012-01-24 NOTE — Anesthesia Procedure Notes (Signed)
Procedure Name: LMA Insertion Date/Time: 01/24/2012 11:10 AM Performed by: Verlan Friends Pre-anesthesia Checklist: Patient identified, Emergency Drugs available, Suction available, Patient being monitored and Timeout performed Patient Re-evaluated:Patient Re-evaluated prior to inductionOxygen Delivery Method: Circle System Utilized Preoxygenation: Pre-oxygenation with 100% oxygen Intubation Type: IV induction Ventilation: Mask ventilation without difficulty LMA: LMA inserted LMA Size: 4.0 Number of attempts: 1 Airway Equipment and Method: bite block Placement Confirmation: positive ETCO2 Tube secured with: Tape Dental Injury: Teeth and Oropharynx as per pre-operative assessment  Comments: Patient removed upper denture, placed in denture cup, will follow patient to PACU

## 2012-01-24 NOTE — Interval H&P Note (Signed)
History and Physical Interval Note:  01/24/2012 10:45 AM  Tiffany Romero  has presented today for surgery, with the diagnosis of left wrist carpal tunnel syndrome   The various methods of treatment have been discussed with the patient and family. After consideration of risks, benefits and other options for treatment, the patient has consented to  Procedure(s) (LRB) with comments: CARPAL TUNNEL RELEASE (Left) as a surgical intervention .  The patient's history has been reviewed, patient examined, no change in status, stable for surgery.  I have reviewed the patient's chart and labs.  Questions were answered to the patient's satisfaction.     [name]

## 2012-01-24 NOTE — Op Note (Signed)
PATIENT ID:      Tiffany Romero  MRN:     [id number] DOB/AGE:    Mar 07, 1970 / 42 y.o.       OPERATIVE REPORT    DATE OF PROCEDURE:  01/24/2012       PREOPERATIVE DIAGNOSIS:   left wrist carpal tunnel syndrome       Estimated Body mass index is 26.78 kg/(m^2) as calculated from the following:   Height as of this encounter: 5\' 8" (1.727 m).   Weight as of this encounter: 176 lb 2 oz(79.89 kg).                                                        POSTOPERATIVE DIAGNOSIS:   Left Wrist Carpal Tunnel Syndrome                                                                      PROCEDURE:  Procedure(s): CARPAL TUNNEL RELEASE     SURGEON: [id number] J    ASSISTANT:   Shirl Harris PA-C   (Present and scrubbed throughout the case, critical for assistance with exposure, retraction, and closure.)         ANESTHESIA: GET    TOURNIQUET TIME:   COMPLICATIONS:  None        SPECIMENS: None   INDICATIONS FOR PROCEDURE: The patient has L CTS . Pain wakes patient up at least 5 of 7 nights per week with numbness to the fingertips. Patient is failed conservative measures with observation anti-inflammatory medicine and night splints. Risks and benefits of surgery have been discussed, questions answered.    A tourniquet was placed on the forearm. The upper extremity was prepped and draped in the usual sterile fashion from the tourniquet to the fingertips. A timeout procedure was performed.The hand and forearm were wrapped with an Esmarch bandage and the tourniquet inflated to Hg. A longitudinal palmar incision starting at the wrist flexion crease going distally for 4 cm just to the ulnar-sided the thenar crease was made through the skin and subcutaneous tissue. With traction on the skin edges, we then cut down into the carpal tunnel distally, passed a Freer elevator into the carpal tunnel palmarly and cut down on the elevator performing the carpal tunnel release. The fascial incision was  extended into the forearm with the black handled scissors for 2-3cm.We then examined the tendons and the median nerve which had an hourglass appearance but was intact. There were no ganglia or masses in the carpal tunnel. The wound was irrigated out normal saline solution and the skin only closed with running 4-0 nylon suture. A dressing of Xerofoam, 4 x 4 dressing sponges, 2 inch web roll and 2 inch Ace wrap was applied and the Esmarch tourniquet removed for a tourniquet time of 10 minutes. At this point, a sling was applied, the patient was laid supine awakened extubated and taken to the recovery room.

## 2012-01-25 ENCOUNTER — Encounter (HOSPITAL_BASED_OUTPATIENT_CLINIC_OR_DEPARTMENT_OTHER): Payer: Self-pay | Admitting: Orthopedic Surgery

## 2012-03-12 ENCOUNTER — Emergency Department (HOSPITAL_COMMUNITY): Payer: Medicare Other

## 2012-03-12 ENCOUNTER — Encounter (HOSPITAL_COMMUNITY): Payer: Self-pay | Admitting: Emergency Medicine

## 2012-03-12 ENCOUNTER — Emergency Department (HOSPITAL_COMMUNITY)
Admission: EM | Admit: 2012-03-12 | Discharge: 2012-03-12 | Disposition: A | Discharge status: Home / Self Care | Payer: Medicare Other | Attending: Emergency Medicine | Admitting: Emergency Medicine

## 2012-03-12 DIAGNOSIS — S46909A Unspecified injury of unspecified muscle, fascia and tendon at shoulder and upper arm level, unspecified arm, initial encounter: Secondary | ICD-10-CM | POA: Insufficient documentation

## 2012-03-12 DIAGNOSIS — Z791 Long term (current) use of non-steroidal anti-inflammatories (NSAID): Secondary | ICD-10-CM | POA: Insufficient documentation

## 2012-03-12 DIAGNOSIS — Z8669 Personal history of other diseases of the nervous system and sense organs: Secondary | ICD-10-CM | POA: Insufficient documentation

## 2012-03-12 DIAGNOSIS — S4980XA Other specified injuries of shoulder and upper arm, unspecified arm, initial encounter: Secondary | ICD-10-CM | POA: Insufficient documentation

## 2012-03-12 DIAGNOSIS — IMO0002 Reserved for concepts with insufficient information to code with codable children: Secondary | ICD-10-CM | POA: Insufficient documentation

## 2012-03-12 DIAGNOSIS — M129 Arthropathy, unspecified: Secondary | ICD-10-CM | POA: Insufficient documentation

## 2012-03-12 DIAGNOSIS — F17200 Nicotine dependence, unspecified, uncomplicated: Secondary | ICD-10-CM | POA: Insufficient documentation

## 2012-03-12 DIAGNOSIS — S4992XA Unspecified injury of left shoulder and upper arm, initial encounter: Secondary | ICD-10-CM

## 2012-03-12 DIAGNOSIS — Z8679 Personal history of other diseases of the circulatory system: Secondary | ICD-10-CM | POA: Insufficient documentation

## 2012-03-12 DIAGNOSIS — Y9389 Activity, other specified: Secondary | ICD-10-CM | POA: Insufficient documentation

## 2012-03-12 DIAGNOSIS — Y929 Unspecified place or not applicable: Secondary | ICD-10-CM | POA: Insufficient documentation

## 2012-03-12 DIAGNOSIS — Z792 Long term (current) use of antibiotics: Secondary | ICD-10-CM | POA: Insufficient documentation

## 2012-03-12 DIAGNOSIS — Z79899 Other long term (current) drug therapy: Secondary | ICD-10-CM | POA: Insufficient documentation

## 2012-03-12 DIAGNOSIS — Z96649 Presence of unspecified artificial hip joint: Secondary | ICD-10-CM | POA: Insufficient documentation

## 2012-03-12 DIAGNOSIS — F411 Generalized anxiety disorder: Secondary | ICD-10-CM | POA: Insufficient documentation

## 2012-03-12 MED ORDER — HYDROCODONE-ACETAMINOPHEN 5-325 MG PO TABS: 1.0000 | ORAL_TABLET | Freq: Four times a day (QID) | ORAL | Status: DC | PRN

## 2012-03-12 MED ORDER — IBUPROFEN 800 MG PO TABS: 800.0000 mg | ORAL_TABLET | Freq: Three times a day (TID) | ORAL | Status: DC

## 2012-03-12 NOTE — ED Provider Notes (Signed)
Medical screening examination/treatment/procedure(s) were performed by non-physician practitioner and as supervising physician I was immediately available for consultation/collaboration.   Kevin E Steinl, MD 03/12/12 1955 

## 2012-03-12 NOTE — ED Provider Notes (Signed)
History    This chart was scribed for non-physician practitioner working with Suzi Roots, MD by Melba Coon, ED Scribe. This patient was seen in room WTR6/WTR6 and the patient's care was started at 5:12PM.     CSN: [id number]  Arrival date & time 03/12/12  1446   First MD Initiated Contact with Patient 03/12/12 1632      Chief Complaint  Patient presents with  . Shoulder Pain    (Consider location/radiation/quality/duration/timing/severity/associated sxs/prior treatment) The history is provided by the patient. No language interpreter was used.   Tiffany Romero is a 42 y.o. female who presents to the Emergency Department complaining of constant, moderate to severe left shoulder pain with an onset 5 days ago. She reports a mechanism of injury: she reports her son pulled backwards on her hands while she had them raised above her head in an effort to pop her back. She reports she heard a "crack"/"pop". She also reports that she aggravated the injury when she was changing a tire 3 days ago. She denies any past chronic shoulder pain, but she reports a history of carpal tunnel in her left wrist and is followed by an orthopedist. Ibuprofen mildly alleviated the pain. Denies HA, fever, neck pain, sore throat, rash, back pain, CP, SOB, abdominal pain, nausea, emesis, diarrhea, dysuria, or extremity edema, weakness, numbness, or tingling. No other pertinent medical symptoms.  Past Medical History  Diagnosis Date  . PONV (postoperative nausea and vomiting)   . Headache     migraines related to stress  . Anxiety     takes lexapro  . Carpal tunnel syndrome, bilateral   . Arthritis     djd    Past Surgical History  Procedure Laterality Date  . Cesarean section      1989  . Anterior cruciate ligament repair    . Knee arthroscopy    . Tubal ligation      1996  . Abdominal hysterectomy      2007  . Cervix surgery    . Total hip arthroplasty  01/18/2011    Procedure: TOTAL HIP  ARTHROPLASTY;  Surgeon: [name];  Location: [facility];  Service: Orthopedics;  Laterality: Right;  DEPUY/PINNICAL POLY  . Carpal tunnel release  01/24/2012    Procedure: CARPAL TUNNEL RELEASE;  Surgeon: [name], MD;  Location: [facility];  Service: Orthopedics;  Laterality: Left;    No family history on file.  History  Substance Use Topics  . Smoking status: Current Every Day Smoker -- 0.50 packs/day  . Smokeless tobacco: Not on file  . Alcohol Use: Yes     Comment: occasionally    OB History   Grav Para Term Preterm Abortions TAB SAB Ect Mult Living                  Review of Systems 10 Systems reviewed and all are negative for acute change except as noted in the HPI.   Allergies  Coumadin  Home Medications   Current Outpatient Rx  Name  Route  Sig  Dispense  Refill  . clonazePAM (KLONOPIN) 0.5 MG tablet   Oral   Take 0.5 mg by mouth at bedtime as needed for anxiety (sleep).          Marland Kitchen ibuprofen (ADVIL,MOTRIN) 800 MG tablet   Oral   Take 800 mg by mouth every 8 (eight) hours as needed for pain.         . minocycline (  DYNACIN) 100 MG tablet   Oral   Take 100 mg by mouth 2 (two) times daily.         . phentermine 37.5 MG capsule   Oral   Take 37.5 mg by mouth every morning.         . rizatriptan (MAXALT) 10 MG tablet   Oral   Take 10 mg by mouth as needed. May repeat in 2 hours if needed. For migraines.          Marland Kitchen rOPINIRole (REQUIP) 0.5 MG tablet   Oral   Take 0.5 mg by mouth every evening.         Marland Kitchen tiZANidine (ZANAFLEX) 4 MG capsule   Oral   Take 4 mg by mouth 3 (three) times daily as needed.         Marland Kitchen HYDROcodone-acetaminophen (NORCO/VICODIN) 5-325 MG per tablet   Oral   Take 1 tablet by mouth every 6 (six) hours as needed for pain.   10 tablet   0   . ibuprofen (ADVIL,MOTRIN) 800 MG tablet   Oral   Take 1 tablet (800 mg total) by mouth 3 (three) times daily.   21 tablet   0     BP 110/75  Pulse 78   Temp(Src) 98.5 F (36.9 C) (Oral)  Resp 18  SpO2 99%  Physical Exam  Nursing note and vitals reviewed. Constitutional: She is oriented to person, place, and time. She appears well-developed and well-nourished. No distress.  HENT:  Head: Normocephalic and atraumatic.  Eyes: EOM are normal.  Neck: Neck supple. No tracheal deviation present.  Cardiovascular: Normal rate.   Pulmonary/Chest: Effort normal. No respiratory distress.  Musculoskeletal: Normal range of motion. She exhibits tenderness. She exhibits no edema.  Decreased ROM secondary to pain in her left shoulder without obvious deformity or swelling. ROM is intact in joints below the shoulders.  Neurological: She is alert and oriented to person, place, and time.  Grip strength normal and symmetrical bilaterally.  Skin: Skin is warm and dry.  Psychiatric: She has a normal mood and affect. Her behavior is normal.    ED Course  Procedures (including critical care time)  DIAGNOSTIC STUDIES: Oxygen Saturation is 98% on room air, normal by my interpretation.    COORDINATION OF CARE:  5:15PM - imaging results reviewed and are unremarkable. Arm sling will be given here at the ED and ibuprofen and Vicodin will be prescribed for Tiffany Romero. She will be given referral to f/u with her orthopedist, Dr. Gean Birchwood, per pt request.   Labs Reviewed - No data to display Dg Shoulder Left  03/12/2012  *RADIOLOGY REPORT*  Clinical Data: Post-traumatic left shoulder pain.  LEFT SHOULDER - 2+ VIEW  Comparison: None.  Findings: The mineralization and alignment are normal.  There is no evidence of acute fracture or dislocation.  The subacromial space is preserved.  There are mild acromioclavicular degenerative changes.  Mild irregularity of the acromion on the Y views does not appear acute, although could be secondary to spurring or an unfused os acromiale.  IMPRESSION: No acute osseous findings identified.   Original Report Authenticated By:  Carey Bullocks, M.D.      1. Shoulder injury, left, initial encounter       MDM  I personally performed the services described in this documentation, which was scribed in my presence. The recorded information has been reviewed and is accurate.       Dorthula Matas, PA 03/12/12 [phone]
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
## 2013-01-11 HISTORY — PX: FOOT SURGERY: SHX648

## 2013-02-19 ENCOUNTER — Ambulatory Visit: Payer: Medicare Other | Attending: Orthopedic Surgery | Admitting: Physical Therapy

## 2013-02-19 DIAGNOSIS — IMO0001 Reserved for inherently not codable concepts without codable children: Secondary | ICD-10-CM | POA: Insufficient documentation

## 2013-02-19 DIAGNOSIS — M25579 Pain in unspecified ankle and joints of unspecified foot: Secondary | ICD-10-CM | POA: Insufficient documentation

## 2013-02-19 DIAGNOSIS — R5381 Other malaise: Secondary | ICD-10-CM | POA: Insufficient documentation

## 2013-02-21 ENCOUNTER — Ambulatory Visit: Payer: Medicare Other | Admitting: Physical Therapy

## 2013-02-26 ENCOUNTER — Ambulatory Visit: Payer: Medicare Other | Admitting: *Deleted

## 2013-02-28 ENCOUNTER — Encounter: Payer: Medicare Other | Admitting: Physical Therapy

## 2015-02-20 ENCOUNTER — Ambulatory Visit (INDEPENDENT_AMBULATORY_CARE_PROVIDER_SITE_OTHER): Payer: Medicare Other | Admitting: Psychiatry

## 2015-02-20 ENCOUNTER — Encounter (HOSPITAL_COMMUNITY): Payer: Self-pay | Admitting: Psychiatry

## 2015-02-20 VITALS — BP 125/80 | HR 80 | Ht 69.0 in | Wt 177.0 lb

## 2015-02-20 DIAGNOSIS — F0630 Mood disorder due to known physiological condition, unspecified: Secondary | ICD-10-CM | POA: Diagnosis not present

## 2015-02-20 DIAGNOSIS — F411 Generalized anxiety disorder: Secondary | ICD-10-CM

## 2015-02-20 DIAGNOSIS — F331 Major depressive disorder, recurrent, moderate: Secondary | ICD-10-CM

## 2015-02-20 DIAGNOSIS — G8929 Other chronic pain: Secondary | ICD-10-CM

## 2015-02-20 DIAGNOSIS — M549 Dorsalgia, unspecified: Secondary | ICD-10-CM | POA: Diagnosis not present

## 2015-02-20 DIAGNOSIS — F439 Reaction to severe stress, unspecified: Secondary | ICD-10-CM

## 2015-02-20 DIAGNOSIS — Z638 Other specified problems related to primary support group: Secondary | ICD-10-CM

## 2015-02-20 MED ORDER — VENLAFAXINE HCL ER 75 MG PO CP24: 75.0000 mg | ORAL_CAPSULE | Freq: Every day | ORAL | Status: DC
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
## 2015-02-20 NOTE — Patient Instructions (Signed)
Cut down klonopine and limit to night use only. Taper down and discontinue in 2 weeks as patient is also on oxycodone. Will add effexor  in addition to  already taking. Consider therapy Consider getting more help for your dad in care giving.

## 2015-04-03 ENCOUNTER — Ambulatory Visit (HOSPITAL_COMMUNITY): Payer: Self-pay | Admitting: Psychiatry

## 2016-04-23 ENCOUNTER — Emergency Department (HOSPITAL_COMMUNITY)
Admission: EM | Admit: 2016-04-23 | Discharge: 2016-04-23 | Disposition: A | Discharge status: Home / Self Care | Payer: No Typology Code available for payment source | Attending: Emergency Medicine | Admitting: Emergency Medicine

## 2016-04-23 ENCOUNTER — Encounter (HOSPITAL_COMMUNITY): Payer: Self-pay | Admitting: Emergency Medicine

## 2016-04-23 ENCOUNTER — Emergency Department (HOSPITAL_COMMUNITY): Payer: No Typology Code available for payment source

## 2016-04-23 DIAGNOSIS — M542 Cervicalgia: Secondary | ICD-10-CM | POA: Diagnosis not present

## 2016-04-23 DIAGNOSIS — Y92410 Unspecified street and highway as the place of occurrence of the external cause: Secondary | ICD-10-CM | POA: Insufficient documentation

## 2016-04-23 DIAGNOSIS — Y9389 Activity, other specified: Secondary | ICD-10-CM | POA: Diagnosis not present

## 2016-04-23 DIAGNOSIS — Z79899 Other long term (current) drug therapy: Secondary | ICD-10-CM | POA: Diagnosis not present

## 2016-04-23 DIAGNOSIS — S199XXA Unspecified injury of neck, initial encounter: Secondary | ICD-10-CM | POA: Diagnosis present

## 2016-04-23 DIAGNOSIS — Y999 Unspecified external cause status: Secondary | ICD-10-CM | POA: Diagnosis not present

## 2016-04-23 DIAGNOSIS — M549 Dorsalgia, unspecified: Secondary | ICD-10-CM | POA: Diagnosis not present

## 2016-04-23 HISTORY — DX: Other chronic pain: G89.29

## 2016-04-23 HISTORY — DX: Dorsalgia, unspecified: M54.9

## 2016-04-23 MED ORDER — KETOROLAC TROMETHAMINE 30 MG/ML IJ SOLN
15.0000 mg | Freq: Once | INTRAMUSCULAR | Status: AC
  Administered 2016-04-23: 15 mg via INTRAMUSCULAR
  Filled 2016-04-23: qty 1
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
## 2016-04-23 NOTE — ED Notes (Signed)
Pt gone over to CT 
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
## 2016-09-09 ENCOUNTER — Ambulatory Visit (HOSPITAL_COMMUNITY)
Admission: RE | Admit: 2016-09-09 | Discharge: 2016-09-09 | Disposition: A | Discharge status: Home / Self Care | Payer: Medicare Other | Attending: Psychiatry | Admitting: Psychiatry

## 2016-09-09 DIAGNOSIS — F319 Bipolar disorder, unspecified: Secondary | ICD-10-CM | POA: Diagnosis present

## 2016-09-09 NOTE — H&P (Signed)
Behavioral Health Medical Screening Exam  Tiffany LimerickDeborah M Krizek is an 46 y.o. female.  Total Time spent with patient: 20 minutes  Psychiatric Specialty Exam: Physical Exam  Constitutional: She is oriented to person, place, and time. She appears well-developed and well-nourished. No distress.  HENT:  Head: Normocephalic and atraumatic.  Right Ear: External ear normal.  Left Ear: External ear normal.  Eyes: Conjunctivae are normal. Right eye exhibits no discharge. Left eye exhibits no discharge. No scleral icterus.  Neck: Normal range of motion.  Cardiovascular: Normal rate, regular rhythm and normal heart sounds.   Respiratory: Effort normal and breath sounds normal. No respiratory distress.  Musculoskeletal: Normal range of motion.  Neurological: She is alert and oriented to person, place, and time.  Skin: Skin is warm and dry. She is not diaphoretic.  Psychiatric: Her speech is normal. Judgment normal. Her mood appears anxious. Cognition and memory are normal. She exhibits a depressed mood. She expresses no homicidal and no suicidal ideation.    Review of Systems  Respiratory: Negative for cough, shortness of breath and wheezing.   Cardiovascular: Negative for chest pain and palpitations.  Neurological: Positive for headaches. Negative for dizziness, seizures and loss of consciousness.  Psychiatric/Behavioral: Positive for depression. Negative for hallucinations, memory loss, substance abuse and suicidal ideas. The patient is nervous/anxious. The patient does not have insomnia.   All other systems reviewed and are negative.   Blood pressure (!) 142/93, pulse 87, temperature 99.1 F (37.3 C), temperature source Oral, resp. rate 18, SpO2 99 %.There is no height or weight on file to calculate BMI.  General Appearance: Casual and Fairly Groomed  Eye Contact:  Good  Speech:  Clear and Coherent and Normal Rate  Volume:  Normal  Mood:  Anxious, Depressed, Hopeless, Irritable and Worthless   Affect:  Congruent and Depressed  Thought Process:  Coherent and Goal Directed  Orientation:  Full (Time, Place, and Person)  Thought Content:  Logical and Hallucinations: None  Suicidal Thoughts:  No  Homicidal Thoughts:  No  Memory:  Immediate;   Good Recent;   Good Remote;   Good  Judgement:  Intact  Insight:  Fair  Psychomotor Activity:  Restlessness  Concentration: Concentration: Fair and Attention Span: Fair  Recall:  Good  Fund of Knowledge:Good  Language: Good  Akathisia:  No  Handed:  Right  AIMS (if indicated):     Assets:  Communication Skills Desire for Improvement Financial Resources/Insurance Housing Leisure Time Physical Health Transportation  Sleep:       Musculoskeletal: Strength & Muscle Tone: within normal limits Gait & Station: normal   Blood pressure (!) 142/93, pulse 87, temperature 99.1 F (37.3 C), temperature source Oral, resp. rate 18, SpO2 99 %.  Recommendations:  Based on my evaluation the patient does not appear to have an emergency medical condition.  Jackelyn PolingJason A Jenefer Woerner, NP 09/09/2016, 10:24 PM
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
## 2016-09-15 ENCOUNTER — Ambulatory Visit (INDEPENDENT_AMBULATORY_CARE_PROVIDER_SITE_OTHER): Payer: Medicare Other | Admitting: Clinical

## 2016-09-15 ENCOUNTER — Encounter (HOSPITAL_COMMUNITY): Payer: Self-pay | Admitting: Clinical

## 2016-09-15 DIAGNOSIS — F411 Generalized anxiety disorder: Secondary | ICD-10-CM | POA: Diagnosis not present

## 2016-09-15 DIAGNOSIS — F1121 Opioid dependence, in remission: Secondary | ICD-10-CM
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
## 2016-09-15 NOTE — Progress Notes (Signed)
   THERAPIST PROGRESS NOTE  Session Time: 2:30pm-4:00pm  Participation Level: Active  Behavioral Response: CasualAlertAnxious  Type of Therapy: Individual Therapy  Treatment Goals addressed: Anxiety  Interventions: CBT and Motivational Interviewing  Summary: Tiffany Romero is a 46 y.o. female who presents with Generalized Anxiety Disorder and Opioid Use Disorder in early remission.   Suicidal/Homicidal: Nowithout intent/plan  Therapist Response: Clinician conducted CCA interview with Tiffany Romero in order to address concerns about high levels of anxiety and recent recovery attempts from her Opioid Use Disorder. Tiffany Romero presented as highly anxious [facility] these sxs. Tiffany Romero reported worry about court case, as well as her role as caregiver for her ailing father and son, who also has substance abuse issues. It is recommended for Tiffany Romero to enroll in CDIOP treatment in order to address the substance abuse issues, as well as meet with a psychiatrist for medication management.    Plan: Return again in 1-2 weeks.  Diagnosis: Axis I: Generalized Anxiety Disorder     Opioid Use disorder, moderate, in early remission   Veneda MelterJessica R. Schlosberg, LCSW 09/15/16  Powell,Frances A, LCSW 09/15/2016

## 2016-09-17 ENCOUNTER — Other Ambulatory Visit: Payer: Self-pay

## 2016-09-17 ENCOUNTER — Emergency Department (HOSPITAL_COMMUNITY)
Admission: EM | Admit: 2016-09-17 | Discharge: 2016-09-17 | Disposition: A | Discharge status: Other Acute Inpatient Hospital | Payer: Medicare Other | Attending: Emergency Medicine | Admitting: Emergency Medicine

## 2016-09-17 ENCOUNTER — Encounter (HOSPITAL_COMMUNITY): Payer: Self-pay

## 2016-09-17 ENCOUNTER — Inpatient Hospital Stay (HOSPITAL_COMMUNITY)
Admission: RE | Admit: 2016-09-17 | Discharge: 2016-09-21 | DRG: 885 | Disposition: A | Discharge status: Home / Self Care | Payer: Medicare Other | Source: Other Acute Inpatient Hospital | Attending: Psychiatry | Admitting: Psychiatry

## 2016-09-17 ENCOUNTER — Encounter (HOSPITAL_COMMUNITY): Payer: Self-pay | Admitting: Emergency Medicine

## 2016-09-17 DIAGNOSIS — F3113 Bipolar disorder, current episode manic without psychotic features, severe: Secondary | ICD-10-CM | POA: Diagnosis not present

## 2016-09-17 DIAGNOSIS — Z23 Encounter for immunization: Secondary | ICD-10-CM

## 2016-09-17 DIAGNOSIS — G4700 Insomnia, unspecified: Secondary | ICD-10-CM | POA: Diagnosis not present

## 2016-09-17 DIAGNOSIS — F319 Bipolar disorder, unspecified: Secondary | ICD-10-CM | POA: Diagnosis present

## 2016-09-17 DIAGNOSIS — F419 Anxiety disorder, unspecified: Secondary | ICD-10-CM

## 2016-09-17 DIAGNOSIS — Z96641 Presence of right artificial hip joint: Secondary | ICD-10-CM | POA: Diagnosis present

## 2016-09-17 DIAGNOSIS — F309 Manic episode, unspecified: Secondary | ICD-10-CM | POA: Diagnosis not present

## 2016-09-17 DIAGNOSIS — J45909 Unspecified asthma, uncomplicated: Secondary | ICD-10-CM | POA: Diagnosis present

## 2016-09-17 DIAGNOSIS — Z79899 Other long term (current) drug therapy: Secondary | ICD-10-CM

## 2016-09-17 DIAGNOSIS — Z818 Family history of other mental and behavioral disorders: Secondary | ICD-10-CM | POA: Diagnosis not present

## 2016-09-17 DIAGNOSIS — G43909 Migraine, unspecified, not intractable, without status migrainosus: Secondary | ICD-10-CM | POA: Diagnosis present

## 2016-09-17 DIAGNOSIS — F3163 Bipolar disorder, current episode mixed, severe, without psychotic features: Secondary | ICD-10-CM | POA: Diagnosis present

## 2016-09-17 DIAGNOSIS — F22 Delusional disorders: Secondary | ICD-10-CM | POA: Diagnosis not present

## 2016-09-17 DIAGNOSIS — F3112 Bipolar disorder, current episode manic without psychotic features, moderate: Secondary | ICD-10-CM | POA: Diagnosis not present

## 2016-09-17 DIAGNOSIS — F17200 Nicotine dependence, unspecified, uncomplicated: Secondary | ICD-10-CM | POA: Insufficient documentation

## 2016-09-17 DIAGNOSIS — F17210 Nicotine dependence, cigarettes, uncomplicated: Secondary | ICD-10-CM | POA: Diagnosis not present

## 2016-09-17 DIAGNOSIS — F411 Generalized anxiety disorder: Secondary | ICD-10-CM | POA: Diagnosis present

## 2016-09-17 DIAGNOSIS — F410 Panic disorder [episodic paroxysmal anxiety] without agoraphobia: Secondary | ICD-10-CM | POA: Diagnosis present

## 2016-09-17 DIAGNOSIS — Z888 Allergy status to other drugs, medicaments and biological substances status: Secondary | ICD-10-CM | POA: Diagnosis not present

## 2016-09-17 DIAGNOSIS — F1121 Opioid dependence, in remission: Secondary | ICD-10-CM | POA: Diagnosis present

## 2016-09-17 DIAGNOSIS — F322 Major depressive disorder, single episode, severe without psychotic features: Secondary | ICD-10-CM | POA: Diagnosis present

## 2016-09-17 DIAGNOSIS — F332 Major depressive disorder, recurrent severe without psychotic features: Secondary | ICD-10-CM | POA: Diagnosis present

## 2016-09-17 DIAGNOSIS — Z881 Allergy status to other antibiotic agents status: Secondary | ICD-10-CM | POA: Diagnosis not present

## 2016-09-17 DIAGNOSIS — F1010 Alcohol abuse, uncomplicated: Secondary | ICD-10-CM | POA: Diagnosis not present

## 2016-09-17 DIAGNOSIS — Z811 Family history of alcohol abuse and dependence: Secondary | ICD-10-CM

## 2016-09-17 DIAGNOSIS — F1220 Cannabis dependence, uncomplicated: Secondary | ICD-10-CM | POA: Diagnosis present

## 2016-09-17 DIAGNOSIS — R450 Nervousness: Secondary | ICD-10-CM | POA: Diagnosis not present

## 2016-09-17 DIAGNOSIS — Z813 Family history of other psychoactive substance abuse and dependence: Secondary | ICD-10-CM | POA: Diagnosis not present

## 2016-09-17 DIAGNOSIS — R45851 Suicidal ideations: Secondary | ICD-10-CM | POA: Diagnosis not present

## 2016-09-17 DIAGNOSIS — F39 Unspecified mood [affective] disorder: Secondary | ICD-10-CM | POA: Diagnosis not present

## 2016-09-17 LAB — COMPREHENSIVE METABOLIC PANEL
ALBUMIN: 4.6 g/dL (ref 3.5–5.0)
ALT: 12 U/L — ABNORMAL LOW (ref 14–54)
ANION GAP: 9 (ref 5–15)
AST: 14 U/L — AB (ref 15–41)
Alkaline Phosphatase: 63 U/L (ref 38–126)
BUN: 6 mg/dL (ref 6–20)
CO2: 26 mmol/L (ref 22–32)
Calcium: 9.5 mg/dL (ref 8.9–10.3)
Chloride: 103 mmol/L (ref 101–111)
Creatinine, Ser: 0.72 mg/dL (ref 0.44–1.00)
GFR calc Af Amer: >60 mL/min (ref >60)
GFR calc non Af Amer: >60 mL/min (ref >60)
GLUCOSE: 107 mg/dL — AB (ref 65–99)
POTASSIUM: 3 mmol/L — AB (ref 3.5–5.1)
SODIUM: 138 mmol/L (ref 135–145)
TOTAL PROTEIN: 7.2 g/dL (ref 6.5–8.1)
Total Bilirubin: 0.3 mg/dL (ref 0.3–1.2)

## 2016-09-17 LAB — RAPID URINE DRUG SCREEN, HOSP PERFORMED
AMPHETAMINES: NOT DETECTED
BARBITURATES: NOT DETECTED
BENZODIAZEPINES: NOT DETECTED
COCAINE: NOT DETECTED
Opiates: NOT DETECTED
Tetrahydrocannabinol: POSITIVE — AB

## 2016-09-17 LAB — CBC
HEMATOCRIT: 47.2 % — AB (ref 36.0–46.0)
Hemoglobin: 16.1 g/dL — ABNORMAL HIGH (ref 12.0–15.0)
MCH: 29.8 pg (ref 26.0–34.0)
MCHC: 34.1 g/dL (ref 30.0–36.0)
MCV: 87.2 fL (ref 78.0–100.0)
Platelets: 215 10*3/uL (ref 150–400)
RBC: 5.41 MIL/uL — ABNORMAL HIGH (ref 3.87–5.11)
RDW: 13.4 % (ref 11.5–15.5)
WBC: 7.3 10*3/uL (ref 4.0–10.5)

## 2016-09-17 LAB — I-STAT BETA HCG BLOOD, ED (MC, WL, AP ONLY): I-stat hCG, quantitative: <5 m[IU]/mL (ref <5)

## 2016-09-17 LAB — ACETAMINOPHEN LEVEL

## 2016-09-17 LAB — ETHANOL: Alcohol, Ethyl (B): <5 mg/dL (ref <5)

## 2016-09-17 LAB — SALICYLATE LEVEL: Salicylate Lvl: <7 mg/dL (ref 2.8–30.0)

## 2016-09-17 MED ORDER — QUETIAPINE FUMARATE 25 MG PO TABS
25.0000 mg | ORAL_TABLET | Freq: Every day | ORAL | Status: DC
  Administered 2016-09-17: 25 mg via ORAL
  Filled 2016-09-17 (×5): qty 1

## 2016-09-17 MED ORDER — HYDROXYZINE HCL 25 MG PO TABS
25.0000 mg | ORAL_TABLET | Freq: Three times a day (TID) | ORAL | Status: DC | PRN
  Administered 2016-09-17 – 2016-09-21 (×12): 25 mg via ORAL
  Filled 2016-09-17 (×12): qty 1

## 2016-09-17 MED ORDER — IBUPROFEN 400 MG PO TABS: 600.0000 mg | ORAL_TABLET | Freq: Three times a day (TID) | ORAL | Status: DC | PRN

## 2016-09-17 MED ORDER — VITAMIN B-1 100 MG PO TABS
100.0000 mg | ORAL_TABLET | Freq: Every day | ORAL | Status: DC
  Administered 2016-09-17: 100 mg via ORAL
  Filled 2016-09-17: qty 1

## 2016-09-17 MED ORDER — ONDANSETRON HCL 4 MG PO TABS: 4.0000 mg | ORAL_TABLET | Freq: Three times a day (TID) | ORAL | Status: DC | PRN

## 2016-09-17 MED ORDER — TOPIRAMATE 25 MG PO TABS
50.0000 mg | ORAL_TABLET | Freq: Two times a day (BID) | ORAL | Status: DC
  Administered 2016-09-17 – 2016-09-21 (×8): 50 mg via ORAL
  Filled 2016-09-17 (×13): qty 2

## 2016-09-17 MED ORDER — ACETAMINOPHEN 325 MG PO TABS: 650.0000 mg | ORAL_TABLET | Freq: Four times a day (QID) | ORAL | Status: DC | PRN

## 2016-09-17 MED ORDER — NICOTINE 21 MG/24HR TD PT24
21.0000 mg | MEDICATED_PATCH | Freq: Every day | TRANSDERMAL | Status: DC
  Administered 2016-09-17: 21 mg via TRANSDERMAL
  Filled 2016-09-17: qty 1

## 2016-09-17 MED ORDER — ALBUTEROL SULFATE HFA 108 (90 BASE) MCG/ACT IN AERS
2.0000 | INHALATION_SPRAY | Freq: Every day | RESPIRATORY_TRACT | Status: DC | PRN
  Administered 2016-09-19: 2 via RESPIRATORY_TRACT
  Filled 2016-09-17: qty 6.7

## 2016-09-17 MED ORDER — ALUM & MAG HYDROXIDE-SIMETH 200-200-20 MG/5ML PO SUSP: 30.0000 mL | ORAL | Status: DC | PRN

## 2016-09-17 MED ORDER — QUETIAPINE FUMARATE 25 MG PO TABS
25.0000 mg | ORAL_TABLET | Freq: Once | ORAL | Status: AC
  Administered 2016-09-17: 25 mg via ORAL

## 2016-09-17 MED ORDER — PNEUMOCOCCAL VAC POLYVALENT 25 MCG/0.5ML IJ INJ
0.5000 mL | INJECTION | INTRAMUSCULAR | Status: AC
  Administered 2016-09-20: 0.5 mL via INTRAMUSCULAR

## 2016-09-17 MED ORDER — LORAZEPAM 1 MG PO TABS
0.0000 mg | ORAL_TABLET | Freq: Four times a day (QID) | ORAL | Status: DC
  Administered 2016-09-17 (×2): 1 mg via ORAL
  Filled 2016-09-17 (×2): qty 1

## 2016-09-17 MED ORDER — LORAZEPAM 1 MG PO TABS: 0.0000 mg | ORAL_TABLET | Freq: Two times a day (BID) | ORAL | Status: DC

## 2016-09-17 MED ORDER — THIAMINE HCL 100 MG/ML IJ SOLN: 100.0000 mg | Freq: Every day | INTRAMUSCULAR | Status: DC

## 2016-09-17 MED ORDER — LORAZEPAM 2 MG/ML IJ SOLN: 0.0000 mg | Freq: Four times a day (QID) | INTRAMUSCULAR | Status: DC

## 2016-09-17 MED ORDER — POTASSIUM CHLORIDE CRYS ER 20 MEQ PO TBCR
40.0000 meq | EXTENDED_RELEASE_TABLET | Freq: Once | ORAL | Status: AC
  Administered 2016-09-17: 40 meq via ORAL
  Filled 2016-09-17: qty 2

## 2016-09-17 MED ORDER — NICOTINE 21 MG/24HR TD PT24
21.0000 mg | MEDICATED_PATCH | Freq: Every day | TRANSDERMAL | Status: DC
  Administered 2016-09-18: 21 mg via TRANSDERMAL
  Filled 2016-09-17 (×4): qty 1

## 2016-09-17 MED ORDER — LORAZEPAM 2 MG/ML IJ SOLN: 0.0000 mg | Freq: Two times a day (BID) | INTRAMUSCULAR | Status: DC

## 2016-09-17 MED ORDER — MAGNESIUM HYDROXIDE 400 MG/5ML PO SUSP: 30.0000 mL | Freq: Every day | ORAL | Status: DC | PRN

## 2016-09-17 NOTE — Plan of Care (Signed)
Problem: Safety: Goal: Periods of time without injury will increase Outcome: Progressing Pt has not harmed self or others tonight.  She denies SI/HI and verbally contracts for safety.    

## 2016-09-17 NOTE — ED Notes (Signed)
Pt completed tele-psych. 

## 2016-09-17 NOTE — ED Notes (Signed)
Contact information [place] HillsBecky Mabe [phone]

## 2016-09-17 NOTE — ED Notes (Signed)
Pt belongings in locker. Shirt, pants, leggings, crocs.

## 2016-09-17 NOTE — Progress Notes (Signed)
D: Pt was in her room upon initial approach.  Pt presents with anxious, depressed affect and mood.  Reports she is "a little stressed out, tired, anxious."  Her goal is to "hopefully get some sleep."  Pt denies SI/HI, denies hallucinations, denies pain.  Pt has been visible in milieu interacting with peers and staff appropriately.  Pt attended evening group.    A: Introduced self to pt.  Actively listened to pt and offered support and encouragement. Medications offered per order.  PRN medication administered for anxiety per request.  Q15 minute safety checks maintained.  R: Pt is safe on the unit.  Pt is compliant with medications except for nicotine patch because "they already gave me one at the other hospital."  Pt verbally contracts for safety.  Will continue to monitor and assess.
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
## 2016-09-17 NOTE — Tx Team (Signed)
Initial Treatment Plan 09/17/2016 7:59 PM Tiffany Limerickeborah M Mallari UXL:[id number]    PATIENT STRESSORS: Financial difficulties Legal issue Marital or family conflict   PATIENT STRENGTHS: Communication skills General fund of knowledge Motivation for treatment/growth   PATIENT IDENTIFIED PROBLEMS: Depression  Suicidal Ideation  Anxiety  "Be able to sleep"  "Get theses anxiety attacks under control"             DISCHARGE CRITERIA:  Improved stabilization in mood, thinking, and/or behavior Verbal commitment to aftercare and medication compliance  PRELIMINARY DISCHARGE PLAN: Outpatient therapy Medication management  PATIENT/FAMILY INVOLVEMENT: This treatment plan has been presented to and reviewed with the patient, Tiffany LimerickDeborah M Romero.  The patient and family have been given the opportunity to ask questions and make suggestions.  Levin BaconHeather V Ommie Degeorge, RN 09/17/2016, 7:59 PM

## 2016-09-17 NOTE — ED Triage Notes (Signed)
Patient brought in by EMS with SI/HI. [facility] and verbalized thoughts of "hurting everyone around me." In triage, patient states "I'm just exhausted from taking care of my dad and my emergency contact." No plan stated at this time.

## 2016-09-17 NOTE — Progress Notes (Signed)
Tiffany Romero is a 46 year old female being admitted voluntarily to [facility] from [facility].  She came to the ED for suicidal ideations, depression, anxiety and feeling overwhelmed.  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  "He is very demanding."  She is reporting "major anxiety."  [facility] and had to go to the ED for evaluation of hypertension.  "They told me that my anxiety is causing me to have high blood pressure.  They told me to stop all my medications except Attarax."  She has not slept in the past two nights.  She denies any HI or A/V hallucinations.  She reported history of chronic head aches.  She is diagnosed with Major Depressive Disorder and Generalized anxiety disorder.  Oriented her to the unit.  Admission paperwork completed and signed.  Belongings searched and no belongings needing to be locked up.  Skin assessment completed and noted white scaly area on left upper forearm with no other skin issues noted.  Q 15 minute checks initiated for safety.  We will monitor the progress towards her goals.

## 2016-09-17 NOTE — ED Notes (Signed)
When patient came in by EMS, she stated to NT Tori "I needed to come up here before I kill myself. I don't want to be alone, I'm hiding stuff at my house so I don't hurt myself."

## 2016-09-17 NOTE — Progress Notes (Signed)
Patient ID: Tiffany Romero, female   DOB: 12/06/1970, 46 y.o.   MRN: [id number]  Per State regulations 482.30 this chart was reviewed for medical necessity with respect to the patient's admission/duration of stay.    Next review date: 09/21/16  Thurman CoyerEric Ximena Todaro, BSN, RN-BC  Case Manager

## 2016-09-17 NOTE — Progress Notes (Signed)
Per Mal[facility] LimesLinsey Strader , [facility] ; Accepting provider is Assunta FoundShuvon Rankin, NP; Attending provider is Dr. Elna BreslowEappen.  Patient can arrive at 5:00pm. Number for report is [phone].   Tsosie Billingobin Gentry, RN notified.   Baldo DaubJolan Hewitt Garner MSW, LCSWA CSW Disposition [phone]

## 2016-09-17 NOTE — Progress Notes (Signed)
Adult Psychoeducational Group Note  Date:  09/17/2016 Time:  10:02 PM  Group Topic/Focus:  Wrap-Up Group:   The focus of this group is to help patients review their daily goal of treatment and discuss progress on daily workbooks.  Participation Level:  Minimal  Participation Quality:  Appropriate  Affect:  Flat  Cognitive:  Oriented  Insight: Appropriate  Engagement in Group:  Engaged  Modes of Intervention:  Socialization and Support  Additional Comments:  Patient attended and participated in group tonight. She reports that she just came [facility] about [facility]. She had dinner and this was her first group.  Tiffany Romero, Melynda Krzywicki [facility] 09/17/2016, 10:02 PM

## 2016-09-17 NOTE — ED Notes (Signed)
Pt reports cp that started upon completion of TTS consultation. EKG performed and Dr Hyacinth MeekerMiller notifed. No New orders

## 2016-09-17 NOTE — ED Provider Notes (Signed)
[facility]-EMER[facility]ENCY DEPT Provider Note   CSN: [id number] Arrival date & time: 09/17/16  0840     History   Chief Complaint Chief Complaint  Patient presents with  . V70.1    HPI Tiffany Romero is a 46 y.o. female.  HPI  The pt is a 46 y/o female - she has hx of significant and severe anxiety to the point where she was seen in the ED on 8/30, [facility] and back today.  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 She reports that she cannot think straight, she has hallucinations which keep her paranoid though she cannot tell me what she is paranoid about, she denies active suicidal thoughts, she denies active homicidal thoughts, she in the same sentence states that she is worried that she might hurt herself but then denies any suicidality. It is difficult to get much of a straight story from her and she is very tangential. She does endorse trying to drink a 12 pack of alcohol last night but eventually states that she opened one can after another pouring them out and spitting out the alcohol that she was trying to drink. 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  Past Medical History:  Diagnosis Date  . Anxiety    takes lexapro  . Arthritis    djd  . Carpal tunnel syndrome, bilateral   . Chronic back pain   . Headache(784.0)    migraines related to stress  . PONV (postoperative nausea and vomiting)     Patient Active Problem List   Diagnosis Date Noted  . Arthritis of hip Right DDH 01/16/2011    Past Surgical History:  Procedure Laterality Date  . ABDOMINAL  HYSTERECTOMY     2007  . ANTERIOR CRUCIATE LIGAMENT REPAIR    . CARPAL TUNNEL RELEASE  01/24/2012   Procedure: CARPAL TUNNEL RELEASE;  Surgeon: Nestor LewandowskyFrank J Rowan, MD;  Location:  [facility];  Service: Orthopedics;  Laterality: Left;  . CERVIX SURGERY    . CESAREAN SECTION     1989  . KNEE ARTHROSCOPY    . TOTAL HIP ARTHROPLASTY  01/18/2011   Procedure: TOTAL HIP ARTHROPLASTY;  Surgeon: Nestor LewandowskyFrank J Rowan;  Location: [facility] OR;  Service: Orthopedics;  Laterality: Right;  DEPUY/PINNICAL POLY  . TUBAL LIGATION     1996    OB History    No data available       Home Medications    Prior to Admission medications   Medication Sig Start Date End Date Taking? Authorizing Provider  albuterol (PROAIR HFA) 108 (90 Base) MCG/ACT inhaler Inhale 2 puffs into the lungs daily as needed for shortness of breath. 12/15/15  Yes [provider]  cyclobenzaprine (FLEXERIL) 10 MG tablet Take 1 tablet by mouth 3 (three) times daily. 09/07/16  Yes [provider]  levocetirizine (XYZAL) 5 MG tablet Take 1 tablet by mouth daily. 04/15/16  Yes [provider]  Melatonin 10 MG TABS Take by mouth.   Yes [provider]  promethazine (  PHENERGAN) 25 MG tablet Take 1 tablet by mouth daily as needed for nausea/vomiting. 04/15/16  Yes [provider]  QUEtiapine (SEROQUEL) 25 MG tablet Take 25 mg by mouth at bedtime. 09/07/16  Yes [provider]  SUMAtriptan (IMITREX) 100 MG tablet Take 1 tablet by mouth daily as needed for migraine. 01/08/16  Yes [provider]  SUMAtriptan 6 MG/0.5ML SOAJ Inject 0.5 mLs into the skin daily as needed for migraine. 04/15/16  Yes [provider]  topiramate (TOPAMAX) 50 MG tablet Take 1 tablet by mouth 3 (three) times daily. 04/23/16  Yes [provider]    Family History Family History  Problem Relation Age of Onset  . Alcohol abuse Mother   . Depression Mother   . Depression Sister   . Depression Paternal  Grandmother     Social History Social History  Substance Use Topics  . Smoking status: Current Every Day Smoker    Packs/day: 0.50  . Smokeless tobacco: Never Used  . Alcohol use Yes     Comment: occasionally     Allergies   Cephalexin; Coumadin [warfarin sodium]; and Trazodone and nefazodone   Review of Systems Review of Systems  Unable to perform ROS: Psychiatric disorder     Physical Exam Updated Vital Signs BP 118/76   Pulse 87   Temp 98.6 F (37 C) (Oral)   Resp 18   Ht  (1.676 m)   Wt 74.8 kg (165 lb)   SpO2 100%   BMI 26.63 kg/m   Physical Exam  Constitutional: She appears well-developed and well-nourished. She appears distressed.  Anxious appearing  HENT:  Head: Normocephalic and atraumatic.  Mouth/Throat: Oropharynx is clear and moist. No oropharyngeal exudate.  Eyes: Pupils are equal, round, and reactive to light. Conjunctivae and EOM are normal. Right eye exhibits no discharge. Left eye exhibits no discharge. No scleral icterus.  Neck: Normal range of motion. Neck supple. No JVD present. No thyromegaly present.  Cardiovascular: Normal rate, regular rhythm, normal heart sounds and intact distal pulses.  Exam reveals no gallop and no friction rub.   No murmur heard. Pulmonary/Chest: Effort normal and breath sounds normal. No respiratory distress. She has no wheezes. She has no rales.  Abdominal: Soft. Bowel sounds are normal. She exhibits no distension and no mass. There is no tenderness.  Musculoskeletal: Normal range of motion. She exhibits no edema or tenderness.  Lymphadenopathy:    She has no cervical adenopathy.  Neurological: She is alert. Coordination normal.  Skin: Skin is warm and dry. No rash noted. No erythema.  Psychiatric:  Odd affect - poor eye contact, flights of ideas, tangential thought, rapid speech / pressured speech, occasionally tearful.  Continues to perseverate on her anxiety and some event that occurred 5 years ago with  loss of a family member.  "What happened last night was 5 years ago".  Nursing note and vitals reviewed.    ED Treatments / Results  Labs (all labs ordered are listed, but only abnormal results are displayed) Labs Reviewed  COMPREHENSIVE METABOLIC PANEL - Abnormal; Notable for the following:       Result Value   Potassium 3.0 (*)    Glucose, Bld 107 (*)    AST 14 (*)    ALT 12 (*)    All other components within normal limits  ACETAMINOPHEN LEVEL - Abnormal; Notable for the following:    Acetaminophen (Tylenol), Serum <10 (*)    All other components within normal limits  CBC -  Abnormal; Notable for the following:    RBC 5.41 (*)    Hemoglobin 16.1 (*)    HCT 47.2 (*)    All other components within normal limits  RAPID URINE DRUG SCREEN, HOSP PERFORMED - Abnormal; Notable for the following:    Tetrahydrocannabinol POSITIVE (*)    All other components within normal limits  ETHANOL  SALICYLATE LEVEL  I-STAT BETA HCG BLOOD, ED ([facility], [facility], [facility] ONLY)    Radiology No results found.  Procedures Procedures (including critical care time)  Medications Ordered in ED Medications  LORazepam (ATIVAN) injection 0-4 mg ( Intravenous See Alternative 09/17/16 1334)    Or  LORazepam (ATIVAN) tablet 0-4 mg (1 mg Oral Given 09/17/16 1334)  LORazepam (ATIVAN) injection 0-4 mg (not administered)    Or  LORazepam (ATIVAN) tablet 0-4 mg (not administered)  thiamine (VITAMIN B-1) tablet 100 mg (100 mg Oral Given 09/17/16 1018)    Or  thiamine (B-1) injection 100 mg ( Intravenous See Alternative 09/17/16 1018)  ibuprofen (ADVIL,MOTRIN) tablet 600 mg (not administered)  ondansetron (ZOFRAN) tablet 4 mg (not administered)  nicotine (NICODERM CQ - dosed in mg/24 hours) patch 21 mg (21 mg Transdermal Patch Applied 09/17/16 1018)     Initial Impression / Assessment and Plan / ED Course  I have reviewed the triage vital signs and the nursing notes.  Pertinent labs & imaging results that were available  during my care of the patient were reviewed by me and considered in my medical decision making (see chart for details).     The pt increasingly anxious, tangential thought, some paranoia, poor eye contact, signs of increasing depression and anxiety. We'll need to have repeat psychiatric consult. I suspected the patient is going to need to have some type of medication though she states she was only prescribed Benadryl at her last visit. She does not trust herself with any prescription drugs because of her history of abuse which complicates the situation.I do not detect any active suicidal thoughts though she does state that she has had some.  [facility] eval - agreeable with my assessment and suggests inpatient treatment would be best - looking for placement.  Accepted at [facility] - stable for transport EMTALA completed at 3:18 PM  Final Clinical Impressions(s) / ED Diagnoses   Final diagnoses:  Mania (HCC)  Anxiety  Paranoia (HCC)    New Prescriptions New Prescriptions   No medications on file     Eber Hong, MD 09/17/16 1519

## 2016-09-18 DIAGNOSIS — F1010 Alcohol abuse, uncomplicated: Secondary | ICD-10-CM

## 2016-09-18 DIAGNOSIS — F3112 Bipolar disorder, current episode manic without psychotic features, moderate: Secondary | ICD-10-CM

## 2016-09-18 DIAGNOSIS — R45851 Suicidal ideations: Secondary | ICD-10-CM

## 2016-09-18 DIAGNOSIS — F17210 Nicotine dependence, cigarettes, uncomplicated: Secondary | ICD-10-CM

## 2016-09-18 DIAGNOSIS — Z811 Family history of alcohol abuse and dependence: Secondary | ICD-10-CM

## 2016-09-18 DIAGNOSIS — Z818 Family history of other mental and behavioral disorders: Secondary | ICD-10-CM

## 2016-09-18 DIAGNOSIS — G4700 Insomnia, unspecified: Secondary | ICD-10-CM

## 2016-09-18 MED ORDER — QUETIAPINE FUMARATE 100 MG PO TABS
100.0000 mg | ORAL_TABLET | Freq: Every day | ORAL | Status: DC
  Administered 2016-09-18 – 2016-09-19 (×2): 100 mg via ORAL
  Filled 2016-09-18 (×4): qty 1

## 2016-09-18 MED ORDER — QUETIAPINE FUMARATE 50 MG PO TABS
50.0000 mg | ORAL_TABLET | Freq: Two times a day (BID) | ORAL | Status: DC
  Filled 2016-09-18 (×6): qty 1

## 2016-09-18 MED ORDER — NICOTINE POLACRILEX 2 MG MT GUM
2.0000 mg | CHEWING_GUM | OROMUCOSAL | Status: DC | PRN
  Administered 2016-09-18 – 2016-09-19 (×5): 2 mg via ORAL
  Filled 2016-09-18: qty 1

## 2016-09-18 MED ORDER — ONDANSETRON 4 MG PO TBDP: 4.0000 mg | ORAL_TABLET | Freq: Three times a day (TID) | ORAL | Status: DC | PRN

## 2016-09-18 MED ORDER — SUMATRIPTAN SUCCINATE 50 MG PO TABS
100.0000 mg | ORAL_TABLET | Freq: Every day | ORAL | Status: DC | PRN
  Administered 2016-09-18: 100 mg via ORAL
  Filled 2016-09-18: qty 2

## 2016-09-18 NOTE — H&P (Signed)
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 Bipolar disorder ([place]) [F31.9] 09/17/2016  . Arthritis of hip Right DDH [M16.10] 01/16/2011   History of Present Illness: Per ED admission note- The pt is a 46 y/o female - she has hx of significant and severe anxiety to the point where she was seen in the ED on 8/30, then at [facility] outpt f/u 2 days ago and back today.  The report was that the patient had been becoming more suicidal because of her worsening anxiety, she reports that she has been the primary caregiver for her father who is 61 with dementia and with whom she has to see 3 times a day when she goes to his house to take care of them. She reports that she cannot think straight, she has hallucinations which keep her paranoid though she cannot tell me what she is paranoid about, she denies active suicidal thoughts, she denies active homicidal thoughts, she in the same sentence states that she is worried that she might hurt herself but then denies any suicidality. It is difficult to get much of a straight story from her and she is very tangential. She does endorse trying to drink a 12 pack of alcohol last night but eventually states that she opened one can after another pouring them out and spitting out the alcohol that she was trying to drink. 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  On evaluation: Tiffany Romero is awake, alert and oriented. Reports history of anxiety and panic attack. patient reports her medications is not helping with her  symptoms. Patient present with pressured speech and is hyperverbal during this assessment.  Patient reports multiple stressors with caring for her father and states she is overwhelmed. 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 States she was taken suboxon  in the past however has been clean for the past year. ( opioids pain pills)  Support, encouragement and reassurance was provided.   Associated Signs/Symptoms: Depression Symptoms:  depressed mood, anxiety, panic attacks, loss of energy/fatigue, (Hypo) Manic Symptoms:  Distractibility, Impulsivity, Irritable Mood, Anxiety Symptoms:  Excessive Worry, Psychotic Symptoms:  Hallucinations: Auditory PTSD Symptoms: Avoidance:  Decreased Interest/Participation Total Time spent with patient: 30 minutes  Past Psychiatric History:   Is the patient at risk to self? Yes.    Has the patient been a risk to self in the past 6 months? Yes.    Has the patient been a risk to self within the distant past? Yes.    Is the patient a risk to others? No.  Has the patient been a risk to others in the past 6 months? No.  Has the patient been a risk to others within the distant past? No.   Prior Inpatient Therapy:   Prior Outpatient Therapy:    Alcohol Screening: 1. How often do you have a drink containing alcohol?: 2 to 4 times a month 2. How many drinks containing alcohol do you have on a typical day when you are  drinking?: 1 or 2 3. How often do you have six or more drinks on one occasion?: Never Preliminary Score: 0 9. Have you or someone else been injured as a result of your drinking?: No 10. Has a relative or friend or a doctor or another health worker been concerned about your drinking or suggested you cut down?: No Alcohol Use Disorder Identification Test Final Score (AUDIT): 2 Brief Intervention: AUDIT score less than 7 or less-screening does not suggest unhealthy drinking-brief  intervention not indicated Substance Abuse History in the last 12 months:  Yes.   Consequences of Substance Abuse: NA Previous Psychotropic Medications: YES Psychological Evaluations: YES Past Medical History:  Past Medical History:  Diagnosis Date  . Anxiety    takes lexapro  . Arthritis    djd  . Carpal tunnel syndrome, bilateral   . Chronic back pain   . Headache(784.0)    migraines related to stress  . PONV (postoperative nausea and vomiting)     Past Surgical History:  Procedure Laterality Date  . ABDOMINAL HYSTERECTOMY     2007  . ANTERIOR CRUCIATE LIGAMENT REPAIR    . CARPAL TUNNEL RELEASE  01/24/2012   Procedure: CARPAL TUNNEL RELEASE;  Surgeon: Kerin Salen, MD;  Location: [place];  Service: Orthopedics;  Laterality: Left;  . CERVIX SURGERY    . CESAREAN SECTION     1989  . KNEE ARTHROSCOPY    . TOTAL HIP ARTHROPLASTY  01/18/2011   Procedure: TOTAL HIP ARTHROPLASTY;  Surgeon: Kerin Salen;  Location: [place];  Service: Orthopedics;  Laterality: Right;  DEPUY/PINNICAL POLY  . TUBAL LIGATION     1996   Family History:  Family History  Problem Relation Age of Onset  . Alcohol abuse Mother   . Depression Mother   . Depression Sister   . Depression Paternal Grandmother    Family Psychiatric  History: paternal grandmother: unknown however reports she was in a locked facility, Mother: Domenic Moras, depression reports codependences  to other illicit drug, father 37 y.o reports control an abusive issue, states he is was past Nature conservation officer  Tobacco Screening: Have you used any form of tobacco in the last 30 days? (Cigarettes, Smokeless Tobacco, Cigars, and/or Pipes): Yes Tobacco use, Select all that apply: 5 or more cigarettes per day Are you interested in Tobacco Cessation Medications?: Yes, will notify MD for an order Counseled patient on smoking cessation including recognizing danger situations, developing coping skills and basic information about quitting provided:  Refused/Declined practical counseling Social History:  History  Alcohol Use  . Yes    Comment: occasionally     History  Drug Use  . Types: Marijuana    Additional Social History:                           Allergies:   Allergies  Allergen Reactions  . Cephalexin Hives  . Coumadin [Warfarin Sodium] Swelling  . Trazodone And Nefazodone Other (See Comments)    migraine   Lab Results:  Results for orders placed or performed during the hospital encounter of 09/17/16 (from the past 48 hour(s))  Rapid urine drug screen (hospital performed)     Status: Abnormal   Collection Time: 09/17/16  8:45 AM  Result Value Ref Range   Opiates NONE DETECTED NONE DETECTED   Cocaine NONE DETECTED NONE DETECTED   Benzodiazepines NONE DETECTED NONE DETECTED   Amphetamines NONE DETECTED NONE DETECTED   Tetrahydrocannabinol POSITIVE (  A) NONE DETECTED   Barbiturates NONE DETECTED NONE DETECTED    Comment:        DRUG SCREEN FOR MEDICAL PURPOSES ONLY.  IF CONFIRMATION IS NEEDED FOR ANY PURPOSE, NOTIFY LAB WITHIN 5 DAYS.        LOWEST DETECTABLE LIMITS FOR URINE DRUG SCREEN Drug Class       Cutoff (ng/mL) Amphetamine      1000 Barbiturate      200 Benzodiazepine   092 Tricyclics       957 Opiates          300 Cocaine          300 THC              50   Comprehensive metabolic panel     Status: Abnormal   Collection Time: 09/17/16  9:15 AM  Result Value Ref Range   Sodium 138 135 - 145 mmol/L   Potassium 3.0 (L) 3.5 - 5.1 mmol/L   Chloride 103 101 - 111 mmol/L   CO2 26 22 - 32 mmol/L   Glucose, Bld 107 (H) 65 - 99 mg/dL   BUN 6 6 - 20 mg/dL   Creatinine, Ser 0.72 0.44 - 1.00 mg/dL   Calcium 9.5 8.9 - 10.3 mg/dL   Total Protein 7.2 6.5 - 8.1 g/dL   Albumin 4.6 3.5 - 5.0 g/dL   AST 14 (L) 15 - 41 U/L   ALT 12 (L) 14 - 54 U/L   Alkaline Phosphatase 63 38 - 126 U/L   Total Bilirubin 0.3 0.3 - 1.2 mg/dL   GFR calc non Af Amer >60 >60 mL/min   GFR calc Af Amer >60 >60 mL/min     Comment: (NOTE) The eGFR has been calculated using the CKD EPI equation. This calculation has not been validated in all clinical situations. eGFR's persistently <60 mL/min signify possible Chronic Kidney Disease.    Anion gap 9 5 - 15  Ethanol     Status: None   Collection Time: 09/17/16  9:15 AM  Result Value Ref Range   Alcohol, Ethyl (B) <5 <5 mg/dL    Comment:        LOWEST DETECTABLE LIMIT FOR SERUM ALCOHOL IS 5 mg/dL FOR MEDICAL PURPOSES ONLY   Salicylate level     Status: None   Collection Time: 09/17/16  9:15 AM  Result Value Ref Range   Salicylate Lvl <4.7 2.8 - 30.0 mg/dL  Acetaminophen level     Status: Abnormal   Collection Time: 09/17/16  9:15 AM  Result Value Ref Range   Acetaminophen (Tylenol), Serum <10 (L) 10 - 30 ug/mL    Comment:        THERAPEUTIC CONCENTRATIONS VARY SIGNIFICANTLY. A RANGE OF 10-30 ug/mL MAY BE AN EFFECTIVE CONCENTRATION FOR MANY PATIENTS. HOWEVER, SOME ARE BEST TREATED AT CONCENTRATIONS OUTSIDE THIS RANGE. ACETAMINOPHEN CONCENTRATIONS >150 ug/mL AT 4 HOURS AFTER INGESTION AND >50 ug/mL AT 12 HOURS AFTER INGESTION ARE OFTEN ASSOCIATED WITH TOXIC REACTIONS.   cbc     Status: Abnormal   Collection Time: 09/17/16  9:15 AM  Result Value Ref Range   WBC 7.3 4.0 - 10.5 K/uL   RBC 5.41 (H) 3.87 - 5.11 MIL/uL   Hemoglobin 16.1 (H) 12.0 - 15.0 g/dL   HCT 47.2 (H) 36.0 - 46.0 %   MCV 87.2 78.0 - 100.0 fL   MCH 29.8 26.0 - 34.0 pg   MCHC 34.1 30.0 - 36.0 g/dL   RDW 13.4 11.5 -  15.5 %   Platelets 215 150 - 400 K/uL  I-Stat beta hCG blood, ED     Status: None   Collection Time: 09/17/16  9:36 AM  Result Value Ref Range   I-stat hCG, quantitative <5.0 <5 mIU/mL   Comment 3            Comment:   GEST. AGE      CONC.  (mIU/mL)   <=1 WEEK        5 - 50     2 WEEKS       50 - 500     3 WEEKS       100 - 10,000     4 WEEKS     1,000 - 30,000        FEMALE AND NON-PREGNANT FEMALE:     LESS THAN 5 mIU/mL     Blood Alcohol level:   Lab Results  Component Value Date   ETH <5 31/54/0086    Metabolic Disorder Labs:  No results found for: HGBA1C, MPG No results found for: PROLACTIN No results found for: CHOL, TRIG, HDL, CHOLHDL, VLDL, LDLCALC  Current Medications: Current Facility-Administered Medications  Medication Dose Route Frequency Provider Last Rate Last Dose  . acetaminophen (TYLENOL) tablet 650 mg  650 mg Oral Q6H PRN Rankin, Shuvon B, NP      . albuterol (PROVENTIL HFA;VENTOLIN HFA) 108 (90 Base) MCG/ACT inhaler 2 puff  2 puff Inhalation Daily PRN Rankin, Shuvon B, NP      . alum & mag hydroxide-simeth (MAALOX/MYLANTA) 200-200-20 MG/5ML suspension 30 mL  30 mL Oral Q4H PRN Rankin, Shuvon B, NP      . hydrOXYzine (ATARAX/VISTARIL) tablet 25 mg  25 mg Oral TID PRN Lindon Romp A, NP   25 mg at 09/18/16 0759  . magnesium hydroxide (MILK OF MAGNESIA) suspension 30 mL  30 mL Oral Daily PRN Rankin, Shuvon B, NP      . nicotine polacrilex (NICORETTE) gum 2 mg  2 mg Oral PRN Money, Lowry Ram, FNP   2 mg at 09/18/16 1038  . ondansetron (ZOFRAN-ODT) disintegrating tablet 4 mg  4 mg Oral Q8H PRN Derrill Center, NP      . pneumococcal 23 valent vaccine (PNU-IMMUNE) injection 0.5 mL  0.5 mL Intramuscular Tomorrow-1000 Eappen, Saramma, MD      . QUEtiapine (SEROQUEL) tablet 100 mg  100 mg Oral QHS Jamilett Ferrante N, NP      . QUEtiapine (SEROQUEL) tablet 50 mg  50 mg Oral BID Derrill Center, NP      . SUMAtriptan (IMITREX) tablet 100 mg  100 mg Oral Daily PRN Derrill Center, NP      . topiramate (TOPAMAX) tablet 50 mg  50 mg Oral BID Rankin, Shuvon B, NP   50 mg at 09/18/16 0758   PTA Medications: Prescriptions Prior to Admission  Medication Sig Dispense Refill Last Dose  . albuterol (PROAIR HFA) 108 (90 Base) MCG/ACT inhaler Inhale 2 puffs into the lungs daily as needed for shortness of breath.   Not Taking  . cyclobenzaprine (FLEXERIL) 10 MG tablet Take 1 tablet by mouth 3 (three) times daily.   Past Week at Unknown  time  . levocetirizine (XYZAL) 5 MG tablet Take 1 tablet by mouth daily.   Not Taking  . Melatonin 10 MG TABS Take by mouth.   Past Week at Unknown time  . promethazine (PHENERGAN) 25 MG tablet Take 1 tablet by mouth daily as needed for  nausea/vomiting.   Not Taking  . QUEtiapine (SEROQUEL) 25 MG tablet Take 25 mg by mouth at bedtime.  2 Past Week at Unknown time  . SUMAtriptan (IMITREX) 100 MG tablet Take 1 tablet by mouth daily as needed for migraine.   Not Taking  . SUMAtriptan 6 MG/0.5ML SOAJ Inject 0.5 mLs into the skin daily as needed for migraine.   Not Taking  . topiramate (TOPAMAX) 50 MG tablet Take 1 tablet by mouth 3 (three) times daily.   Past Week at Unknown time    Musculoskeletal: Strength & Muscle Tone: within normal limits Gait & Station: normal Patient leans: Right  Psychiatric Specialty Exam: Physical Exam  ROS  Blood pressure (!) 125/92, pulse 94, temperature 97.7 F (36.5 C), temperature source Oral, resp. rate 20, height 5' 8"  (1.727 m), weight 66.9 kg (147 lb 8 oz).Body mass index is 22.43 kg/m.  General Appearance: Disheveled  Eye Contact:  Minimal  Speech:  Clear and Coherent  Volume:  Normal  Mood:  Anxious, Depressed and Dysphoric  Affect:  Labile  Thought Process:  Linear  Orientation:  Full (Time, Place, and Person)  Thought Content:  Hallucinations: Auditory, Paranoid Ideation and Rumination  Suicidal Thoughts:  Yes.  with intent/plan  Homicidal Thoughts:  No  Memory:  Immediate;   Fair Recent;   Fair Remote;   Fair  Judgement:  Fair  Insight:  Lacking  Psychomotor Activity:  Restlessness  Concentration:  Concentration: Fair  Recall:  [employer] of Knowledge:  Fair  Language:  Fair  Akathisia:  No  Handed:  Right  AIMS (if indicated):     Assets:  Communication Skills Desire for Improvement Resilience Social Support  ADL's:  Intact  Cognition:  WNL  Sleep:  Number of Hours: 5.25     I agree with current treatment plan on 09/082018,  Patient seen face-to-face for psychiatric evaluation follow-up, chart reviewed and case discussed with the MD Afreen. Reviewed the information documented and agree with the treatment plan.   Treatment Plan Summary: Daily contact with patient to assess and evaluate symptoms and progress in treatment and Medication management  Initiated Seroquel 50 mg Po BID and 100 mg PO QHS mood stabilization. Continue with Trazodone 50 mg for insomnia  Will continue to monitor vitals ,medication compliance and treatment side effects while patient is here.  Reviewed labs Glucose 101 elevated ,BAL - 0, UDS -thc  Pending EKG, TSH, Lipid, A1C and Prolactin   CSW will start working on disposition.  Patient to participate in therapeutic milieu   Observation Level/Precautions:  15 minute checks  Laboratory:  CBC Chemistry Profile HbAIC UA  Psychotherapy:  Individual l and group session  Medications:  See above   Consultations:  Psychiatry  Discharge Concerns: Safety, stabilization, and risk of access to medication and medication stabilization    Estimated LOS: 5-7days  Other:     Physician Treatment Plan for Primary Diagnosis: Bipolar disorder ([place]) Long Term Goal(s): Improvement in symptoms so as ready for discharge  Short Term Goals: Ability to verbalize feelings will improve, Ability to demonstrate self-control will improve and Ability to identify and develop effective coping behaviors will improve  Physician Treatment Plan for Secondary Diagnosis: Principal Problem:   Bipolar disorder ([place])  Long Term Goal(s): Improvement in symptoms so as ready for discharge  Short Term Goals: Ability to verbalize feelings will improve, Ability to demonstrate self-control will improve, Ability to identify and develop effective coping behaviors will improve, Ability to maintain clinical  measurements within normal limits will improve and Ability to identify triggers associated with substance abuse/mental health  issues will improve  I certify that inpatient services furnished can reasonably be expected to improve the patient's condition.    Derrill Center, NP 9/8/201812:39 PM

## 2016-09-18 NOTE — [facility] Group Notes (Signed)
[facility]  Date/Time:    09/18/2016 11:15AM-12:00PM  Type of Therapy and Topic:  Group Therapy:  Healthy vs Unhealthy Coping Skills  Participation Level:  Active   Description of Group:  The focus of this group was to determine what unhealthy coping techniques typically are used by group members and what healthy coping techniques would be helpful in coping with various problems. Patients were guided in becoming aware of the differences between healthy and unhealthy coping techniques.  Patients were asked to identify 1-2 healthy coping skills they would like to learn to use more effectively, and many mentioned meditation, breathing, and relaxation.  A patient was angry about being involuntarily committed, talked about punching holes in walls in order to get himself released "like last time, that's what it took."  The group was asked to help brainstorm other healthier coping skills to deal with frustration.  Therapeutic Goals 1. Patients learned that coping is what human beings do all day long to deal with various situations in their lives 2. Patients defined and discussed healthy vs unhealthy coping techniques 3. Patients identified their preferred coping techniques and identified whether these were healthy or unhealthy 4. Patients determined 1-2 healthy coping skills they would like to become more familiar with 5. Patients provided support and ideas to each other  Summary of Patient Progress: During group, patient expressed that she has a lot of anxiety and has had to be the person in her family who kept everything together for everyone else, has not had a chance to take care of herself.  She was very tearful, clutching another's patient's hand.  She eventually left group and was seen in the hall walking up and down, came in the room only to return a pencil and then leave again while group was ongoing.  Therapeutic Modalities Cognitive Behavioral Therapy Motivational  Interviewing  Ambrose MantleMareida Grossman-Orr, LCSW 09/18/2016, 3:58 PM

## 2016-09-18 NOTE — Progress Notes (Signed)
Adult Psychoeducational Group Note  Date:  09/18/2016 Time:  9:24 PM  Group Topic/Focus:  Wrap-Up Group:   The focus of this group is to help patients review their daily goal of treatment and discuss progress on daily workbooks.  Participation Level:  Active  Participation Quality:  Appropriate  Affect:  Appropriate  Cognitive:  Alert and Appropriate  Insight: Appropriate and Good  Engagement in Group:  Engaged  Modes of Intervention:  Discussion  Additional Comments:  Pt stated her goal for today was to work on discharge plan with her doctor. Pt stated she was able to accomplished her goal and felt good about it. Pt rated her over all day a 10. Pt stated she attended all groups held today.  Tiffany FurnaceChristopher  Tiffany Romero 09/18/2016, 9:24 PM
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
## 2016-09-18 NOTE — Progress Notes (Signed)
D.  Pt pleasant on approach, denies complaints at this time.  Pt is afraid that Seroquel dose of 50 mg BID will be too much along with 100 mg at night and wishes to discuss this with the doctor in AM.  Pt also states she signed a 72 hour request for discharge and wishes to discuss that with the doctor as well.  Pt was positive for evening wrap up group, somewhat intrusive on unit but redirectable.  Pt denies SI/HI/AVH at this time.  A.  Support and encouragement offered, medication given as ordered  R.   Pt remains safe on the unit, will continue to monitor.
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
## 2016-09-19 LAB — LIPID PANEL
CHOL/HDL RATIO: 2.9 ratio
CHOLESTEROL: 197 mg/dL (ref 0–200)
HDL: 69 mg/dL (ref >40)
LDL Cholesterol: 109 mg/dL — ABNORMAL HIGH (ref 0–99)
Triglycerides: 95 mg/dL (ref <150)
VLDL: 19 mg/dL (ref 0–40)

## 2016-09-19 LAB — TSH: TSH: 0.9 u[IU]/mL (ref 0.350–4.500)

## 2016-09-19 LAB — HEMOGLOBIN A1C
Hgb A1c MFr Bld: 5.3 % (ref 4.8–5.6)
MEAN PLASMA GLUCOSE: 105.41 mg/dL

## 2016-09-19 MED ORDER — QUETIAPINE FUMARATE 25 MG PO TABS
25.0000 mg | ORAL_TABLET | Freq: Two times a day (BID) | ORAL | Status: DC
  Administered 2016-09-19 – 2016-09-20 (×2): 25 mg via ORAL
  Filled 2016-09-19 (×4): qty 1

## 2016-09-19 NOTE — Progress Notes (Addendum)
Data. Patient denies SI/HI/AVH. Verbally contracts for safety on the unit and to come to staff before acting of any self harm thoughts/feelings.  Patient continues to be hyper verbal and tangential. She will start talking about her problems and then she starts to get very anxious, tearful and rapid in speech. She is not easily redirected when she becomes too upset. She continues to perseverate on her chosen topics and will not attempt coping skills, even with staff assist. Patient has a running monolog and speaks over staff and will not be redirected. Patient did say she was anxious about her dad, who had dementia, "Who is worried about me and keeps going to my friend's house and banging on the door, looking for me. When I am  home I will go to my friend's house and hide from my dad, because I just can't take him most of the time." Patient reported on her self assessment 0/10 for depression and hopelessness and 10/10 for anxiety, "Because I am being ONLY given Seroquel due to side effects specified." Action. Emotional support and encouragement offered. Education provided on medication, indications and side effect. Q 15 minute checks done for safety. Response. Safety on the unit maintained through 15 minute checks.  Medications taken as prescribed. Attended groups. Remained calm and appropriate through out shift.
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
## 2016-09-19 NOTE — B[facility] Group Notes (Signed)
[facility]  Date/Time:  09/19/2016  11:00AM-12:00PM  Type of Therapy and Topic:  Group Therapy:  Music and Mood  Participation Level:  Active   Description of Group: In this process group, members listened to a variety of genres of music and identified that different types of music evoke different responses.  Patients were encouraged to identify music that was soothing for them and music that was energizing for them.  Patients discussed how this knowledge can help with wellness and recovery in various ways including managing depression and anxiety as well as encouraging healthy sleep habits.    Therapeutic Goals: 1. Patients will explore the impact of different varieties of music on mood 2. Patients will verbalize the thoughts they have when listening to different types of music 3. Patients will identify music that is soothing to them as well as music that is energizing to them 4. Patients will discuss how to use this knowledge to assist in maintaining wellness and recovery 5. Patients will explore the use of music as a coping skill  Summary of Patient Progress:  At the beginning of group, patient expressed she felt exhausted, but had no depression or anxiety.  At the end of group, she stated she felt the "opposite" to exhaustion, was calm and had no anxiety/depression still.  Therapeutic Modalities: Solution Focused Brief Therapy Motivational Interviewing Activity   Ambrose MantleMareida Grossman-Orr, LCSW 09/19/2016 8:37 AM

## 2016-09-19 NOTE — BHH Group Notes (Signed)
BHH Group Notes:  (Nursing/MHT/Case Management/Adjunct)  Date:  09/19/2016  Time:  2:43 PM  Type of Therapy:  Psychoeducational Skills  Participation Level:  Active  Participation Quality:  Appropriate  Affect:  Appropriate  Cognitive:  Appropriate  Insight:  Appropriate  Engagement in Group:  Engaged  Modes of Intervention:  Discussion  Summary of Progress/Problems: Pt did attend self inventory group.   Synia Douglass Shanta 09/19/2016, 2:43 PM 

## 2016-09-19 NOTE — Progress Notes (Signed)
D.  Pt pleasant on approach, complaint of anxiety.  Pt states that [facility] [place] [facility] and though she feels guilty she knows she can no longer take care of him.  Pt states she has been caring for everyone else and has neglected her own well being.  Pt states that once her father goes there she will start living her life.  She is considering a trip to [place] at that time as she has always wanted to go there.  Pt denies SI/HI/AVH at this time.  Pt was tearful but states she is looking forward to going home.  A.  Support and encouragement offered, medication given as ordered  R.  Pt remains safe on the unit, will continue to monitor.

## 2016-09-19 NOTE — Plan of Care (Signed)
Problem: Activity: Goal: Interest or engagement in activities will improve Outcome: Progressing Pt has been attending group on the unit   

## 2016-09-19 NOTE — Progress Notes (Signed)
[facility]  09/19/2016 11:13 AM Tiffany Romero  MRN:  [id number]   Subjective:  Patient report " I am to sedated with these medication, I came her for my anxiety, I feel as if this place is making me worse." Patient reports she has singed a 72 hour request to discharge on 09/18/2016.   Tiffany Romero is awake, alert and oriented. Patient continues to present with hyper verbal and pressure speech.  Patient seen interacting with nursing student. Denies suicidal or homicidal ideation. Denies auditory or visual hallucination and does not appear to be responding to internal stimuli. Patient reports she is medication compliant, however is requesting for her medications to be decreased.  Continues to reports high anxiety 8/10. Patient continues to lack focus with discussion. Patient is redirectable. Patient reports she has a good appetite and states that she rested "okay" on last night. Support, encouragement and reassurance was provided.    Principal Problem: Bipolar disorder (HCC) Diagnosis:   Patient Active Problem List   Diagnosis Date Noted  . Bipolar disorder (HCC) [F31.9] 09/17/2016  . 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 Anxiety    takes lexapro  . Arthritis    djd  . Carpal tunnel syndrome, bilateral   . Chronic back pain   . Headache(784.0)    migraines related to stress  . PONV (postoperative nausea and vomiting)     Past Surgical History:  Procedure Laterality Date  . ABDOMINAL HYSTERECTOMY     2007  . ANTERIOR CRUCIATE LIGAMENT REPAIR    . CARPAL TUNNEL RELEASE  01/24/2012   Procedure: CARPAL TUNNEL RELEASE;  Surgeon: Nestor LewandowskyFrank J Rowan, MD;  Location: [facility];  Service: Orthopedics;  Laterality: Left;  . CERVIX SURGERY    . CESAREAN SECTION     1989  . KNEE ARTHROSCOPY    . TOTAL HIP ARTHROPLASTY   01/18/2011   Procedure: TOTAL HIP ARTHROPLASTY;  Surgeon: Nestor LewandowskyFrank J Rowan;  Location: [facility];  Service: Orthopedics;  Laterality: Right;  DEPUY/PINNICAL POLY  . TUBAL LIGATION     1996   Family History:  Family History  Problem Relation Age of Onset  . Alcohol abuse Mother   . Depression Mother   . Depression Sister   . Depression Paternal Grandmother    Family Psychiatric  History:  Social History:  History  Alcohol Use  . Yes    Comment: occasionally     History  Drug Use  . Types: Marijuana    Social History   Social History  . Marital status: Divorced    Spouse name: N/A  . Number of children: N/A  . Years of education: N/A   Social History Main Topics  . Smoking status: Current Every Day Smoker    Packs/day: 0.50  . Smokeless tobacco: Never Used  . Alcohol use Yes     Comment: occasionally  . Drug use: Yes    Types: Marijuana  . Sexual activity: Not Currently   Other Topics Concern  . None   Social History Narrative  . None   Additional Social History:                         Sleep: Fair  Appetite:  Fair  Current Medications: Current Facility-Administered Medications  Medication Dose Route  Frequency Provider Last Rate Last Dose  . acetaminophen (TYLENOL) tablet 650 mg  650 mg Oral Q6H PRN Rankin, Shuvon B, NP      . albuterol (PROVENTIL HFA;VENTOLIN HFA) 108 (90 Base) MCG/ACT inhaler 2 puff  2 puff Inhalation Daily PRN Rankin, Shuvon B, NP      . alum & mag hydroxide-simeth (MAALOX/MYLANTA) 200-200-20 MG/5ML suspension 30 mL  30 mL Oral Q4H PRN Rankin, Shuvon B, NP      . hydrOXYzine (ATARAX/VISTARIL) tablet 25 mg  25 mg Oral TID PRN Nira Conn A, NP   25 mg at 09/19/16 0741  . magnesium hydroxide (MILK OF MAGNESIA) suspension 30 mL  30 mL Oral Daily PRN Rankin, Shuvon B, NP      . nicotine polacrilex (NICORETTE) gum 2 mg  2 mg Oral PRN Money, Gerlene Burdock, FNP   2 mg at 09/18/16 1656  . ondansetron (ZOFRAN-ODT) disintegrating tablet 4 mg  4 mg  Oral Q8H PRN Oneta Rack, NP      . pneumococcal 23 valent vaccine (PNU-IMMUNE) injection 0.5 mL  0.5 mL Intramuscular Tomorrow-1000 Eappen, Saramma, MD      . QUEtiapine (SEROQUEL) tablet 100 mg  100 mg Oral QHS Oneta Rack, NP   100 mg at 09/18/16 2100  . QUEtiapine (SEROQUEL) tablet 25 mg  25 mg Oral BID Oneta Rack, NP      . SUMAtriptan (IMITREX) tablet 100 mg  100 mg Oral Daily PRN Oneta Rack, NP   100 mg at 09/18/16 1252  . topiramate (TOPAMAX) tablet 50 mg  50 mg Oral BID Rankin, Shuvon B, NP   50 mg at 09/19/16 0740    Lab Results:  Results for orders placed or performed during the hospital encounter of 09/17/16 (from the past 48 hour(s))  TSH     Status: None   Collection Time: 09/19/16  6:16 AM  Result Value Ref Range   TSH 0.900 0.350 - 4.500 uIU/mL    Comment: Performed by a 3rd Generation assay with a functional sensitivity of <=0.01 uIU/mL. Performed at [facility], [address] [place], [place] [address]   Hemoglobin A1c     Status: None   Collection Time: 09/19/16  6:16 AM  Result Value Ref Range   Hgb A1c MFr Bld 5.3 4.8 - 5.6 %    Comment: (NOTE) Pre diabetes:          5.7%-6.4% Diabetes:              >6.4% Glycemic control for   <7.0% adults with diabetes    Mean Plasma Glucose 105.41 mg/dL    Comment: Performed at [facility], [address], [place], [place] [address]  Lipid panel     Status: Abnormal   Collection Time: 09/19/16  6:16 AM  Result Value Ref Range   Cholesterol 197 0 - 200 mg/dL   Triglycerides 95 <829 mg/dL   HDL 69 >56 mg/dL   Total CHOL/HDL Ratio 2.9 RATIO   VLDL 19 0 - 40 mg/dL   LDL Cholesterol 213 (H) 0 - 99 mg/dL    Comment:        Total Cholesterol/HDL:CHD Risk Coronary Heart Disease Risk Table                     Men   Women  1/2 Average Risk   3.4   3.3  Average Risk       5.0  4.4  2 X Average Risk   9.6   7.1  3 X Average Risk  23.4   11.0        Use the calculated Patient  Ratio above and the CHD Risk Table to determine the patient's CHD Risk.        ATP III CLASSIFICATION (LDL):  <100     mg/dL   Optimal  161-096  mg/dL   Near or Above                    Optimal  130-159  mg/dL   Borderline  045-409  mg/dL   High  >811     mg/dL   Very High Performed at [facility], [address] [place], [place] [address]     Blood Alcohol level:  Lab Results  Component Value Date   ETH <5 09/17/2016    Metabolic Disorder Labs: Lab Results  Component Value Date   HGBA1C 5.3 09/19/2016   MPG 105.41 09/19/2016   No results found for: PROLACTIN Lab Results  Component Value Date   CHOL 197 09/19/2016   TRIG 95 09/19/2016   HDL 69 09/19/2016   CHOLHDL 2.9 09/19/2016   VLDL 19 09/19/2016   LDLCALC 109 (H) 09/19/2016    Physical Findings: AIMS: Facial and Oral Movements Muscles of Facial Expression: None, normal Lips and Perioral Area: None, normal Jaw: None, normal Tongue: None, normal,Extremity Movements Upper (arms, wrists, hands, fingers): None, normal Lower (legs, knees, ankles, toes): None, normal, Trunk Movements Neck, shoulders, hips: None, normal, Overall Severity Severity of abnormal movements (highest score from questions above): None, normal Incapacitation due to abnormal movements: None, normal Patient's awareness of abnormal movements (rate only patient's report): No Awareness, Dental Status Current problems with teeth and/or dentures?: No Does patient usually wear dentures?: No  CIWA:  CIWA-Ar Total: 3 COWS:     Musculoskeletal: Strength & Muscle Tone: within normal limits Gait & Station: normal Patient leans: N/A  Psychiatric Specialty Exam: Physical Exam  Vitals reviewed. Cardiovascular: Normal rate.   Neurological: She is alert.  Psychiatric: She has a normal mood and affect. Her behavior is normal.    Review of Systems  Psychiatric/Behavioral: Positive for depression. The patient is nervous/anxious.     Blood  pressure 114/90, pulse 91, temperature 97.9 F (36.6 C), temperature source Oral, resp. rate 16, height  (1.727 m), weight 66.9 kg (147 lb 8 oz).Body mass index is 22.43 kg/m.  General Appearance: Casual  Eye Contact:  Fair  Speech:  Pressured  Volume:  Normal  Mood:  Anxious and Dysphoric  Affect:  Labile  Thought Process:  Linear  Orientation:  Full (Time, Place, and Person)  Thought Content:  Paranoid Ideation and Rumination  Suicidal Thoughts:  No  Homicidal Thoughts:  No  Memory:  Immediate;   Fair Recent;   Fair Remote;   Fair  Judgement:  Fair  Insight:  Fair  Psychomotor Activity:  Normal  Concentration:  Concentration: Fair  Recall:  [employer] of Knowledge:  Fair  Language:  Good  Akathisia:  No  Handed:  Right  AIMS (if indicated):     Assets:  Communication Skills Desire for Improvement Physical Health Resilience Social Support  ADL's:  Intact  Cognition:  WNL  Sleep:  Number of Hours: 4    I agree with current treatment plan on 09/19/2016, Patient seen face-to-face for psychiatric evaluation follow-up, chart reviewed.Reviewed the information documented and agree with  the treatment plan.  Treatment Plan Summary: Daily contact with patient to assess and evaluate symptoms and progress in treatment and Medication management    Decreased  Seroquel 25 mg PO BID and 100 mg for QHS for mood stabilization.  Will continue to monitor vitals ,medication compliance and treatment side effects while patient is here.  Reviewed labs CSW will start working on disposition.  Patient to participate in therapeutic milieu  Oneta Rack, NP 09/19/2016, 11:13 AM

## 2016-09-20 ENCOUNTER — Encounter (HOSPITAL_COMMUNITY): Payer: Self-pay | Admitting: Psychiatry

## 2016-09-20 DIAGNOSIS — R450 Nervousness: Secondary | ICD-10-CM

## 2016-09-20 DIAGNOSIS — F410 Panic disorder [episodic paroxysmal anxiety] without agoraphobia: Secondary | ICD-10-CM | POA: Diagnosis present

## 2016-09-20 DIAGNOSIS — F411 Generalized anxiety disorder: Secondary | ICD-10-CM | POA: Diagnosis present

## 2016-09-20 DIAGNOSIS — F1220 Cannabis dependence, uncomplicated: Secondary | ICD-10-CM | POA: Clinically undetermined

## 2016-09-20 DIAGNOSIS — F332 Major depressive disorder, recurrent severe without psychotic features: Secondary | ICD-10-CM | POA: Diagnosis present

## 2016-09-20 DIAGNOSIS — F1121 Opioid dependence, in remission: Secondary | ICD-10-CM | POA: Clinically undetermined

## 2016-09-20 DIAGNOSIS — F322 Major depressive disorder, single episode, severe without psychotic features: Secondary | ICD-10-CM | POA: Diagnosis present

## 2016-09-20 LAB — PROLACTIN: PROLACTIN: 19.1 ng/mL (ref 4.8–23.3)

## 2016-09-20 MED ORDER — GABAPENTIN 100 MG PO CAPS
200.0000 mg | ORAL_CAPSULE | Freq: Three times a day (TID) | ORAL | Status: DC
  Administered 2016-09-20 – 2016-09-21 (×4): 200 mg via ORAL
  Filled 2016-09-20 (×10): qty 2

## 2016-09-20 MED ORDER — QUETIAPINE FUMARATE 50 MG PO TABS
50.0000 mg | ORAL_TABLET | Freq: Every day | ORAL | Status: DC
  Administered 2016-09-20: 50 mg via ORAL
  Filled 2016-09-20 (×3): qty 1

## 2016-09-20 MED ORDER — VENLAFAXINE HCL ER 37.5 MG PO CP24
37.5000 mg | ORAL_CAPSULE | Freq: Every day | ORAL | Status: DC
  Administered 2016-09-20 – 2016-09-21 (×2): 37.5 mg via ORAL
  Filled 2016-09-20 (×5): qty 1

## 2016-09-20 MED ORDER — NICOTINE 21 MG/24HR TD PT24
21.0000 mg | MEDICATED_PATCH | Freq: Every day | TRANSDERMAL | Status: DC
  Administered 2016-09-20 – 2016-09-21 (×2): 21 mg via TRANSDERMAL
  Filled 2016-09-20 (×4): qty 1

## 2016-09-20 MED ORDER — LORAZEPAM 1 MG PO TABS
1.0000 mg | ORAL_TABLET | Freq: Once | ORAL | Status: AC
  Administered 2016-09-20: 1 mg via ORAL
  Filled 2016-09-20: qty 2

## 2016-09-20 MED ORDER — MIRTAZAPINE 7.5 MG PO TABS
7.5000 mg | ORAL_TABLET | Freq: Every day | ORAL | Status: DC
  Administered 2016-09-20: 7.5 mg via ORAL
  Filled 2016-09-20 (×3): qty 1

## 2016-09-20 NOTE — Progress Notes (Signed)
Pt up in hall, tearful, coughing.  Pt states that she woke up feeling panicky and does this "to punish myself".  Pt states she won't let herself take drugs or narcotics and so she suffers.  Pt requested to be off nicorette gum and to be given patch.  Pt used inhaler, received Vistaril and returned to bed.  Pt states night time is the worst for her due to her chronic insomnia.

## 2016-09-20 NOTE — Progress Notes (Addendum)
[facility] MD Progress Note  09/20/2016 2:49 PM Tiffany Romero  MRN:  [id number]   Subjective: Patient states " I am so anxious , My main problem with panic attacks. I do not want to hurt my father who has dementia and that is why I am here . I feel claustrophobic in here ."  Objective:Patient seen and chart reviewed.Discussed patient with treatment team.  Pt today seen as very anxious , depressed, with pressured speech, circumstantial thought process. Pt seen as taking deep breathes several times during our conversation. Pt appeared to be extremely nervous and restless. Pt reports sleep issues . She is anxious about her father who is demented and is still driving.  She feels guilty that he may have to be placed at a [facility]. Pt states that being on [address] is making her more anxious since she is on a locked unit. Pt agrees to restarting effexor for anxiety sx , she has tolerated it well in the past. She wants to be taken off of seroquel , since she feels it is making her worse. Pt would like to be started on another sleep aid - hence discussed remeron - which she agrees to. Trazodone gave her nightmares in the past. She reports abusing cannabis daily to cope with anxiety . Per RN - pt needs a lot of support , has been having panic attacks , making use of PRN medications.       Principal Problem: Major depressive disorder, recurrent episode, severe (HCC) Diagnosis:   Patient Active Problem List   Diagnosis Date Noted  . Panic disorder [F41.0] 09/20/2016  . GAD (generalized anxiety disorder) [F41.1] 09/20/2016  . Cannabis use disorder, severe, dependence (HCC) [F12.20] 09/20/2016  . Opioid use disorder, severe, in sustained remission (HCC) [F11.21] 09/20/2016  . Major depressive disorder, recurrent episode, severe (HCC) [F33.2] 09/20/2016  . Arthritis of hip Right DDH [M16.10] 01/16/2011   Total Time spent with patient: 25 minutes  Past Psychiatric History:   Past Medical History:  Past  Medical History:  Diagnosis Date  . Anxiety    takes lexapro  . Arthritis    djd  . Carpal tunnel syndrome, bilateral   . Chronic back pain   . Headache(784.0)    migraines related to stress  . PONV (postoperative nausea and vomiting)     Past Surgical History:  Procedure Laterality Date  . ABDOMINAL HYSTERECTOMY     2007  . ANTERIOR CRUCIATE LIGAMENT REPAIR    . CARPAL TUNNEL RELEASE  01/24/2012   Procedure: CARPAL TUNNEL RELEASE;  Surgeon: [name], MD;  Location: [facility];  Service: Orthopedics;  Laterality: Left;  . CERVIX SURGERY    . CESAREAN SECTION     1989  . KNEE ARTHROSCOPY    . TOTAL HIP ARTHROPLASTY  01/18/2011   Procedure: TOTAL HIP ARTHROPLASTY;  Surgeon: [name];  Location: [facility] OR;  Service: Orthopedics;  Laterality: Right;  DEPUY/PINNICAL POLY  . TUBAL LIGATION     1996   Family History:  Family History  Problem Relation Age of Onset  . Alcohol abuse Mother   . Depression Mother   . Depression Sister   . Alcohol abuse Sister   . Depression Paternal Grandmother   . Drug abuse Son    Family Psychiatric  History:  Social History:  History  Alcohol Use  . Yes    Comment: occasionally     History  Drug Use  . Types: Marijuana  Social History   Social History  . Marital status: Divorced    Spouse name: N/A  . Number of children: N/A  . Years of education: N/A   Social History Main Topics  . Smoking status: Current Every Day Smoker    Packs/day: 0.50  . Smokeless tobacco: Never Used  . Alcohol use Yes     Comment: occasionally  . Drug use: Yes    Types: Marijuana  . Sexual activity: Not Currently   Other Topics Concern  . None   Social History Narrative  . None   Additional Social History:                         Sleep: Poor  Appetite:  Fair  Current Medications: Current Facility-Administered Medications  Medication Dose Route Frequency Provider Last Rate Last Dose  . acetaminophen  (TYLENOL) tablet 650 mg  650 mg Oral Q6H PRN Rankin, Shuvon B, NP      . albuterol (PROVENTIL HFA;VENTOLIN HFA) 108 (90 Base) MCG/ACT inhaler 2 puff  2 puff Inhalation Daily PRN Rankin, Shuvon B, NP   2 puff at 09/19/16 1545  . alum & mag hydroxide-simeth (MAALOX/MYLANTA) 200-200-20 MG/5ML suspension 30 mL  30 mL Oral Q4H PRN Rankin, Shuvon B, NP      . gabapentin (NEURONTIN) capsule 200 mg  200 mg Oral TID Jomarie Longs, MD   200 mg at 09/20/16 1150  . hydrOXYzine (ATARAX/VISTARIL) tablet 25 mg  25 mg Oral TID PRN Nira Conn A, NP   25 mg at 09/20/16 1150  . magnesium hydroxide (MILK OF MAGNESIA) suspension 30 mL  30 mL Oral Daily PRN Rankin, Shuvon B, NP      . nicotine (NICODERM CQ - dosed in mg/24 hours) patch 21 mg  21 mg Transdermal Daily Nira Conn A, NP   21 mg at 09/20/16 0802  . ondansetron (ZOFRAN-ODT) disintegrating tablet 4 mg  4 mg Oral Q8H PRN Oneta Rack, NP      . QUEtiapine (SEROQUEL) tablet 50 mg  50 mg Oral QHS Melyssa Signor, MD      . SUMAtriptan (IMITREX) tablet 100 mg  100 mg Oral Daily PRN Oneta Rack, NP   100 mg at 09/18/16 1252  . topiramate (TOPAMAX) tablet 50 mg  50 mg Oral BID Rankin, Shuvon B, NP   50 mg at 09/20/16 0801  . venlafaxine XR (EFFEXOR-XR) 24 hr capsule 37.5 mg  37.5 mg Oral Q breakfast Tytan Sandate, MD   37.5 mg at 09/20/16 1305    Lab Results:  Results for orders placed or performed during the hospital encounter of 09/17/16 (from the past 48 hour(s))  TSH     Status: None   Collection Time: 09/19/16  6:16 AM  Result Value Ref Range   TSH 0.900 0.350 - 4.500 uIU/mL    Comment: Performed by a 3rd Generation assay with a functional sensitivity of <=0.01 uIU/mL. Performed at [facility], [address], [place], [place] [address]   Prolactin     Status: None   Collection Time: 09/19/16  6:16 AM  Result Value Ref Range   Prolactin 19.1 4.8 - 23.3 ng/mL    Comment: (NOTE) Performed At: [facility] [address] [place], [place] [id number] Mila Homer MD AO:[id number] Performed at [facility], [address] [place], [place] [address]   Hemoglobin A1c     Status: None   Collection Time:  09/19/16  6:16 AM  Result Value Ref Range   Hgb A1c MFr Bld 5.3 4.8 - 5.6 %    Comment: (NOTE) Pre diabetes:          5.7%-6.4% Diabetes:              >6.4% Glycemic control for   <7.0% adults with diabetes    Mean Plasma Glucose 105.41 mg/dL    Comment: Performed at [facility], [address] [place], [place] [address]  Lipid panel     Status: Abnormal   Collection Time: 09/19/16  6:16 AM  Result Value Ref Range   Cholesterol 197 0 - 200 mg/dL   Triglycerides 95 <604<150 mg/dL   HDL 69 >54>40 mg/dL   Total CHOL/HDL Ratio 2.9 RATIO   VLDL 19 0 - 40 mg/dL   LDL Cholesterol 098109 (H) 0 - 99 mg/dL    Comment:        Total Cholesterol/HDL:CHD Risk Coronary Heart Disease Risk Table                     Men   Women  1/2 Average Risk   3.4   3.3  Average Risk       5.0   4.4  2 X Average Risk   9.6   7.1  3 X Average Risk  23.4   11.0        Use the calculated Patient Ratio above and the CHD Risk Table to determine the patient's CHD Risk.        ATP III CLASSIFICATION (LDL):  <100     mg/dL   Optimal  [id number]  mg/dL   Near or Above                    Optimal  130-159  mg/dL   Borderline  [id number]  mg/dL   High  >130>190     mg/dL   Very High Performed at [facility], [address] [place], [place] [address]     Blood Alcohol level:  Lab Results  Component Value Date   [facility] <5 09/17/2016    Metabolic Disorder Labs: Lab Results  Component Value Date   HGBA1C 5.3 09/19/2016   MPG 105.41 09/19/2016   Lab Results  Component Value Date   PROLACTIN 19.1 09/19/2016   Lab Results  Component Value Date   CHOL 197 09/19/2016   TRIG 95 09/19/2016   HDL 69 09/19/2016   CHOLHDL 2.9 09/19/2016   VLDL 19 09/19/2016   LDLCALC 109 (H)  09/19/2016    Physical Findings: AIMS: Facial and Oral Movements Muscles of Facial Expression: None, normal Lips and Perioral Area: None, normal Jaw: None, normal Tongue: None, normal,Extremity Movements Upper (arms, wrists, hands, fingers): None, normal Lower (legs, knees, ankles, toes): None, normal, Trunk Movements Neck, shoulders, hips: None, normal, Overall Severity Severity of abnormal movements (highest score from questions above): None, normal Incapacitation due to abnormal movements: None, normal Patient's awareness of abnormal movements (rate only patient's report): No Awareness, Dental Status Current problems with teeth and/or dentures?: No Does patient usually wear dentures?: No  CIWA:  CIWA-Ar Total: 3 COWS:     Musculoskeletal: Strength & Muscle Tone: within normal limits Gait & Station: normal Patient leans: N/A  Psychiatric Specialty Exam: Physical Exam  Nursing note and vitals reviewed.   Review of Systems  Psychiatric/Behavioral: The patient is nervous/anxious and has insomnia.   All other systems reviewed  and are negative.   Blood pressure (!) 115/96, pulse 92, temperature 97.8 F (36.6 C), temperature source Oral, resp. rate 20, height  (1.727 m), weight 66.9 kg (147 lb 8 oz).Body mass index is 22.43 kg/m.  General Appearance: Casual  Eye Contact:  Fair  Speech:  Pressured  Volume:  Normal  Mood:  Anxious and Dysphoric  Affect:  Labile  Thought Process:  Linear and Descriptions of Associations: Circumstantial  Orientation:  Full (Time, Place, and Person)  Thought Content:  Rumination  Suicidal Thoughts:  No  Homicidal Thoughts:  No  Memory:  Immediate;   Fair Recent;   Fair Remote;   Fair  Judgement:  Fair  Insight:  Fair  Psychomotor Activity:  Restlessness  Concentration:  Concentration: Fair and Attention Span: Fair  Recall:  [employer] of Knowledge:  Fair  Language:  Fair  Akathisia:  No  Handed:  Right  AIMS (if indicated):      Assets:  Communication Skills Desire for Improvement  ADL's:  Intact  Cognition:  WNL  Sleep:  Number of Hours: 2.5   Treatment Plan Summary:Patient with hx of MDD , anxiety do , cannabis abuse , presented with worsening anxiety and depressive sx as well as sleep issues , not tolerating seroquel well, wants to be tapered off of it. Will readjust medications and continue treatment.   Daily contact with patient to assess and evaluate symptoms and progress in treatment, Medication management and Plan see below   Discontinue Seroquel during the day due to pt not tolerating it well. Continue Seroquel 50 mg po qhs for mood sx, will be tapered down. Start Effexor XR 37.5 mg po daily for affective sx. Start Gabapentin 200 mg po tid for anxiety sx. Start Remeron 7.5 mg po qhs for insomnia. CSW will continue to work on disposition.    Yaw Escoto, MD 09/20/2016, 2:49 PM

## 2016-09-20 NOTE — Progress Notes (Signed)
Pt up at nurses station, has been coughing frequently.  Spoke with NP and received order for Cepacol lozenges.  Pt given as ordered, will continue to monitor.

## 2016-09-20 NOTE — Tx Team (Signed)
Interdisciplinary Treatment and Diagnostic Plan Update  09/20/2016 Time of Session: 1:08 PM  Tiffany Romero MRN: [id number]  Principal Diagnosis: Bipolar disorder [facility])  Secondary Diagnoses: Principal Problem:   Bipolar disorder ([place])   Current Medications:  Current Facility-Administered Medications  Medication Dose Route Frequency Provider Last Rate Last Dose  . acetaminophen (TYLENOL) tablet 650 mg  650 mg Oral Q6H PRN Rankin, Shuvon B, NP      . albuterol (PROVENTIL HFA;VENTOLIN HFA) 108 (90 Base) MCG/ACT inhaler 2 puff  2 puff Inhalation Daily PRN Rankin, Shuvon B, NP   2 puff at 09/19/16 1545  . alum & mag hydroxide-simeth (MAALOX/MYLANTA) 200-200-20 MG/5ML suspension 30 mL  30 mL Oral Q4H PRN Rankin, Shuvon B, NP      . gabapentin (NEURONTIN) capsule 200 mg  200 mg Oral TID Ursula Alert, MD   200 mg at 09/20/16 1150  . hydrOXYzine (ATARAX/VISTARIL) tablet 25 mg  25 mg Oral TID PRN Lindon Romp A, NP   25 mg at 09/20/16 1150  . magnesium hydroxide (MILK OF MAGNESIA) suspension 30 mL  30 mL Oral Daily PRN Rankin, Shuvon B, NP      . nicotine (NICODERM CQ - dosed in mg/24 hours) patch 21 mg  21 mg Transdermal Daily Lindon Romp A, NP   21 mg at 09/20/16 0802  . ondansetron (ZOFRAN-ODT) disintegrating tablet 4 mg  4 mg Oral Q8H PRN Derrill Center, NP      . QUEtiapine (SEROQUEL) tablet 50 mg  50 mg Oral QHS Eappen, Saramma, MD      . SUMAtriptan (IMITREX) tablet 100 mg  100 mg Oral Daily PRN Derrill Center, NP   100 mg at 09/18/16 1252  . topiramate (TOPAMAX) tablet 50 mg  50 mg Oral BID Rankin, Shuvon B, NP   50 mg at 09/20/16 0801  . venlafaxine XR (EFFEXOR-XR) 24 hr capsule 37.5 mg  37.5 mg Oral Q breakfast Eappen, Saramma, MD   37.5 mg at 09/20/16 1305    PTA Medications: Prescriptions Prior to Admission  Medication Sig Dispense Refill Last Dose  . albuterol (PROAIR HFA) 108 (90 Base) MCG/ACT inhaler Inhale 2 puffs into the lungs daily as needed for shortness of breath.    Not Taking  . cyclobenzaprine (FLEXERIL) 10 MG tablet Take 1 tablet by mouth 3 (three) times daily.   Past Week at Unknown time  . levocetirizine (XYZAL) 5 MG tablet Take 1 tablet by mouth daily.   Not Taking  . Melatonin 10 MG TABS Take by mouth.   Past Week at Unknown time  . promethazine (PHENERGAN) 25 MG tablet Take 1 tablet by mouth daily as needed for nausea/vomiting.   Not Taking  . QUEtiapine (SEROQUEL) 25 MG tablet Take 25 mg by mouth at bedtime.  2 Past Week at Unknown time  . SUMAtriptan (IMITREX) 100 MG tablet Take 1 tablet by mouth daily as needed for migraine.   Not Taking  . SUMAtriptan 6 MG/0.5ML SOAJ Inject 0.5 mLs into the skin daily as needed for migraine.   Not Taking  . topiramate (TOPAMAX) 50 MG tablet Take 1 tablet by mouth 3 (three) times daily.   Past Week at Unknown time    Patient Stressors: Arts development officer issue Marital or family conflict  Patient Strengths: Curator fund of knowledge Motivation for treatment/growth  Treatment Modalities: Medication Management, Group therapy, Case management,  1 to 1 session with clinician, Psychoeducation, Recreational therapy.   Physician Treatment Plan for  Primary Diagnosis: Bipolar disorder ([place]) Long Term Goal(s): Improvement in symptoms so as ready for discharge  Short Term Goals: Ability to verbalize feelings will improve Ability to demonstrate self-control will improve Ability to identify and develop effective coping behaviors will improve Ability to verbalize feelings will improve Ability to demonstrate self-control will improve Ability to identify and develop effective coping behaviors will improve Ability to maintain clinical measurements within normal limits will improve Ability to identify triggers associated with substance abuse/mental health issues will improve  Medication Management: Evaluate patient's response, side effects, and tolerance of medication  regimen.  Therapeutic Interventions: 1 to 1 sessions, Unit Group sessions and Medication administration.  Evaluation of Outcomes: Adequate for Discharge  Physician Treatment Plan for Secondary Diagnosis: Principal Problem:   Bipolar disorder ([place])   Long Term Goal(s): Improvement in symptoms so as ready for discharge  Short Term Goals: Ability to verbalize feelings will improve Ability to demonstrate self-control will improve Ability to identify and develop effective coping behaviors will improve Ability to verbalize feelings will improve Ability to demonstrate self-control will improve Ability to identify and develop effective coping behaviors will improve Ability to maintain clinical measurements within normal limits will improve Ability to identify triggers associated with substance abuse/mental health issues will improve  Medication Management: Evaluate patient's response, side effects, and tolerance of medication regimen.  Therapeutic Interventions: 1 to 1 sessions, Unit Group sessions and Medication administration.  Evaluation of Outcomes: Adequate for Discharge   RN Treatment Plan for Primary Diagnosis: Bipolar disorder ([place]) Long Term Goal(s): Knowledge of disease and therapeutic regimen to maintain health will improve  Short Term Goals: Ability to identify and develop effective coping behaviors will improve and Compliance with prescribed medications will improve  Medication Management: RN will administer medications as ordered by provider, will assess and evaluate patient's response and provide education to patient for prescribed medication. RN will report any adverse and/or side effects to prescribing provider.  Therapeutic Interventions: 1 on 1 counseling sessions, Psychoeducation, Medication administration, Evaluate responses to treatment, Monitor vital signs and CBGs as ordered, Perform/monitor CIWA, COWS, AIMS and Fall Risk screenings as ordered, Perform wound care  treatments as ordered.  Evaluation of Outcomes: Adequate for Discharge   LCSW Treatment Plan for Primary Diagnosis: Bipolar disorder [facility]) Long Term Goal(s): Safe transition to appropriate next level of care at discharge, Engage patient in therapeutic group addressing interpersonal concerns.  Short Term Goals: Engage patient in aftercare planning with referrals and resources  Therapeutic Interventions: Assess for all discharge needs, 1 to 1 time with Social worker, Explore available resources and support systems, Assess for adequacy in community support network, Educate family and significant other(s) on suicide prevention, Complete Psychosocial Assessment, Interpersonal group therapy.  Evaluation of Outcomes: Met  Return home, follow up outpt   Progress in Treatment: Attending groups: Yes Participating in groups: Yes Taking medication as prescribed: Yes Toleration medication: Yes, no side effects reported at this time Family/Significant other contact made: No Patient understands diagnosis: Yes AEB asking for help with anxiety and getting off of Seroquel Discussing patient identified problems/goals with staff: Yes Medical problems stabilized or resolved: Yes Denies suicidal/homicidal ideation: Yes Issues/concerns per patient self-inventory: None Other: N/A  New problem(s) identified: None identified at this time.   New Short Term/Long Term Goal(s): "I have anxiety real bad, including panic attacks.  I don't want to be on Seroquel anymore."  Discharge Plan or Barriers:   Reason for Continuation of Hospitalization: Anxiety Depression Mood instability Circumstantial thoughts Medication stabilization  Estimated Length of Stay: [place] d/c tomorrow  Attendees: Patient: Tiffany Romero 09/20/2016  1:08 PM  Physician: Ursula Alert, MD 09/20/2016  1:08 PM  Nursing: Sena Hitch, RN 09/20/2016  1:08 PM  RN Care Manager: Lars Pinks, RN 09/20/2016  1:08 PM  Social  Worker: Ripley Fraise 09/20/2016  1:08 PM  Recreational Therapist: Winfield Cunas 09/20/2016  1:08 PM  Other: Norberto Sorenson 09/20/2016  1:08 PM  Other:  09/20/2016  1:08 PM    Scribe for Treatment Team:  Roque Lias LCSW 09/20/2016 1:08 PM

## 2016-09-20 NOTE — [facility] Group Notes (Signed)
LCSW Group Therapy Note   09/20/2016 1:15pm   Type of Therapy and Topic:  Group Therapy:  Overcoming Obstacles   Participation Level:  Active   Description of Group:    In this group patients will be encouraged to explore what they see as obstacles to their own wellness and recovery. They will be guided to discuss their thoughts, feelings, and behaviors related to these obstacles. The group will process together ways to cope with barriers, with attention given to specific choices patients can make. Each patient will be challenged to identify changes they are motivated to make in order to overcome their obstacles. This group will be process-oriented, with patients participating in exploration of their own experiences as well as giving and receiving support and challenge from other group members.   Therapeutic Goals: 1. Patient will identify personal and current obstacles as they relate to admission. 2. Patient will identify barriers that currently interfere with their wellness or overcoming obstacles.  3. Patient will identify feelings, thought process and behaviors related to these barriers. 4. Patient will identify two changes they are willing to make to overcome these obstacles:      Summary of Patient Progress  Spent the first part of the meeting c/o feeling "confined" and how no one except other patients are truly empathetic and supportive, as well as staff who "give false answers and misinformation."  I listened for as long as possible, then told her we were done with that and were moving along.  She did not fight this.  Identified needing to set boundaries with others as her main obstacle, and talked about needing to stay focused on herself and her needs.  Stated her main support right now is her 47 year old Building services engineerBoxer "[name]."     Therapeutic Modalities:   Cognitive Behavioral Therapy Solution Focused Therapy Motivational Interviewing Relapse Prevention Therapy  Tiffany RogueRodney B Klyn Kroening,  LCSW 09/20/2016 3:54 PM

## 2016-09-20 NOTE — Progress Notes (Signed)
Recreation Therapy Notes  Date: 09/20/16 Time: 1000 Location: 500 Hall Dayroom  Group Topic: Coping Skills  Goal Area(s) Addresses:  Patients will be able to identify positive coping skills. Patients will be able to identify benefits of coping skills post d/c.  Behavioral Response: Engaged  Intervention:  Dry erase marker, mind map worksheet, pencils  Activity: Mind map.  LRT gave each patient a blank copy of a mind map.  LRT and patients filled in the first eight boxes together.  Patients identified the areas were they would use coping skills as being in the hospital, stress, anxiety, bad news, drama/chaos, moving, loss of a loved one and health problems.  Individually, patients were to identify possible coping skills for each of the eight areas identified.  LRT would then reconvene the group and fill in the coping skills on the board.  Education: PharmacologistCoping Skills, Building control surveyorDischarge Planning.   Education Outcome: Acknowledges understanding/In group clarification offered/Needs additional education.   Clinical Observations/Feedback: Pt was engaged during group.  Pt identified her coping skills as being honest with doctors, be knowledgeable about condition and medications, deep breathing, understand the difference between drama and chaos and asking for help.  Pt explained that she has coping skills but doesn't always use them.  Pt also expressed that she knows how to help others but not help herself.   Caroll RancherMarjette Marshella Tello, LRT/CTRS         Caroll RancherLindsay, Omar Gayden A 09/20/2016 12:23 PM

## 2016-09-20 NOTE — Progress Notes (Signed)
Spoke with NP about Pt's anxiety and agitation and new one time order received for Ativan 1 mg.  Spoke with Pt about this order and Pt agreeable to take medication.  Pt presently in quiet room with book.  No acute distress noted.  BP 124/87, HR 85

## 2016-09-20 NOTE — Progress Notes (Signed)
Pt up again at nurses station, states she feels panicked, states she gets like this every morning around this time and that this is her "pattern".  Pt requested that her blood pressure and heart rate be taken stating "it's important to me" to get it on record what happens to her.  Pt states she doesn't want to resend the 72 hour request for discharge, but does wish to speak to the doctor about what it is that she needs so that she doesn't "go through this same pattern when I go home".  Will speak to NP about medication to encourage sleep.

## 2016-09-20 NOTE — Plan of Care (Signed)
Problem: Medication: Goal: Compliance with prescribed medication regimen will improve Outcome: Progressing Patient educated on her medications, verbalizes understanding, took all meds as scheduled.  Pt was focused on getting Ativan this afternoon, reported that she was having a panic attack, stated only Ativan could resolve it.  Pt educated on the fact that she does not have an order for Ativan, and became tearful and crying when Ativan was not given to her.  Pt was given Atarax  for anxiety.  Problem: Self-Concept: Goal: Ability to disclose and discuss suicidal ideas will improve Outcome: Progressing Patient denies SI/HI/AVH.  Comments: Patient hyperverbal, demanded for her B/P to be taken this afternoon; B/P was 120/96, HR=90.  Pt presents with manic mood, is labile,crying mostly during interactions with staff, but observed to be talking calmly with her peers.  Patient reports having a good appetite, ate breakfast and lunch, Q15 minute checks in place, will continue to monitor.

## 2016-09-21 DIAGNOSIS — F332 Major depressive disorder, recurrent severe without psychotic features: Secondary | ICD-10-CM

## 2016-09-21 DIAGNOSIS — F411 Generalized anxiety disorder: Secondary | ICD-10-CM

## 2016-09-21 DIAGNOSIS — F1220 Cannabis dependence, uncomplicated: Secondary | ICD-10-CM

## 2016-09-21 DIAGNOSIS — Z813 Family history of other psychoactive substance abuse and dependence: Secondary | ICD-10-CM

## 2016-09-21 DIAGNOSIS — F410 Panic disorder [episodic paroxysmal anxiety] without agoraphobia: Secondary | ICD-10-CM

## 2016-09-21 DIAGNOSIS — F1121 Opioid dependence, in remission: Secondary | ICD-10-CM

## 2016-09-21 MED ORDER — MIRTAZAPINE 7.5 MG PO TABS: 7.5000 mg | ORAL_TABLET | Freq: Every day | ORAL | 0 refills | Status: DC

## 2016-09-21 MED ORDER — VENLAFAXINE HCL ER 37.5 MG PO CP24: 37.5000 mg | ORAL_CAPSULE | Freq: Every day | ORAL | 0 refills | Status: DC

## 2016-09-21 MED ORDER — TOPIRAMATE 50 MG PO TABS: 50.0000 mg | ORAL_TABLET | Freq: Two times a day (BID) | ORAL | 0 refills | Status: DC

## 2016-09-21 MED ORDER — HYDROXYZINE HCL 25 MG PO TABS: 25.0000 mg | ORAL_TABLET | Freq: Three times a day (TID) | ORAL | 0 refills | Status: DC | PRN

## 2016-09-21 MED ORDER — QUETIAPINE FUMARATE 50 MG PO TABS: 50.0000 mg | ORAL_TABLET | Freq: Every day | ORAL | 0 refills | Status: DC

## 2016-09-21 MED ORDER — SUMATRIPTAN SUCCINATE 100 MG PO TABS: 100.0000 mg | ORAL_TABLET | Freq: Every day | ORAL | 0 refills | Status: DC | PRN

## 2016-09-21 MED ORDER — GABAPENTIN 100 MG PO CAPS: 200.0000 mg | ORAL_CAPSULE | Freq: Three times a day (TID) | ORAL | 0 refills | Status: DC

## 2016-09-21 NOTE — [facility] Suicide Risk Assessment (Signed)
[facility] INPATIENT:  Family/Significant Other Suicide Prevention Education  Suicide Prevention Education:  Patient Refusal for Family/Significant Other Suicide Prevention Education: The patient Tiffany Romero has refused to provide written consent for family/significant other to be provided Family/Significant Other Suicide Prevention Education during admission and/or prior to discharge.  Physician notified.  Ida RogueRodney B Ramesha Poster 09/21/2016, 8:14 AM

## 2016-09-21 NOTE — Plan of Care (Signed)
Problem: Safety: Goal: Periods of time without injury will increase Outcome: Progressing Pt safe on the unit at this time   

## 2016-09-21 NOTE — [facility] Suicide Risk Assessment (Addendum)
[facility] Discharge Suicide Risk Assessment   Principal Problem: Major depressive disorder, recurrent episode, severe (HCC) Discharge Diagnoses:  Patient Active Problem List   Diagnosis Date Noted  . Panic disorder [F41.0] 09/20/2016  . GAD (generalized anxiety disorder) [F41.1] 09/20/2016  . Cannabis use disorder, severe, dependence (HCC) [F12.20] 09/20/2016  . Opioid use disorder, severe, in sustained remission (HCC) [F11.21] 09/20/2016  . Major depressive disorder, recurrent episode, severe (HCC) [F33.2] 09/20/2016  . Arthritis of hip Right DDH [M16.10] 01/16/2011    Total Time spent with patient: 30 minutes  Musculoskeletal: Strength & Muscle Tone: within normal limits Gait & Station: normal Patient leans: N/A  Psychiatric Specialty Exam: ROS denies chest pain , no shortness of breath, no vomiting   Blood pressure 132/85, pulse 91, temperature 98.2 F (36.8 C), temperature source Oral, resp. rate 16, height  (1.727 m), weight 66.9 kg (147 lb 8 oz).Body mass index is 22.43 kg/m.  General Appearance: Well Groomed  Patent attorney::  Good  Speech:  pressured at times 409  Volume:  Normal  Mood:  denies feeling depressed   Affect:  vaguely anxious, reactive, smiles at times appropriately   Thought Process:  Linear and Descriptions of Associations: Intact  Orientation:  Full (Time, Place, and Person)  Thought Content:  denies hallucinations, no delusions, not internally preoccupied   Suicidal Thoughts:  No denies any suicidal or self injurious ideations, denies any homicidal or violent ideations   Homicidal Thoughts:  No  Memory:  recent and remote grossly intact   Judgement:  Other:  fair- improving   Insight:  Fair  Psychomotor Activity:  Normal- no psychomotor restlessness or agitation  Concentration:  Good  Recall:  Good  Fund of Knowledge:Good  Language: Good  Akathisia:  Negative  Handed:  Right  AIMS (if indicated):     Assets:  Communication Skills Desire for  Improvement Resilience  Sleep:  Number of Hours: 4.25  Cognition: WNL  ADL's:  Intact   Mental Status Per Nursing Assessment::   On Admission:  Suicidal ideation indicated by patient  Demographic Factors:  46 year old female   Loss Factors: Father has dementia  Historical Factors: At this time patient stresses history of anxiety, denies history of severe depressive episodes or of manic episodes. Reports recent increased anxiety related to psychosocial stressors   Risk Reduction Factors:   Sense of responsibility to family, Positive coping skills or problem solving skills and patient also reports she has a strong attachment and sense of responsibility for her pet dog   Continued Clinical Symptoms:  At this time patient is alert, attentive, well groomed, pleasant. Presents anxious, but this improves during session. Pressured speech at times, but no psychomotor restlessness. Denies feeling depressed, endorses some anxiety , which she states is related to her stressors, but states she is feeling better than prior to admission. No thought disorder, no suicidal ideations, no homicidal ideations. Denies hallucinations, and does not appear internally preoccupied, no delusions expressed.  Of note, patient denies any history of mania or hypomania. She reports insight into presenting somewhat pressured, but states it is due to anxiety and needing to get back to her home to care for father and dog . Denies racing thoughts. Not irritable .  Denies medication side effects. At this time future oriented, states she wants to return home, continue caring for her father and for her pet dog . States she is worried about upcoming storm/hurricane forecasted for this area later in the week and  wants to make sure she has essential items such as water, etc at home before it comes . *As informed by  staff, patient submitted a letter requesting discharge - 72 hour letter , which is up today. I have also  discussed  case with Dr. Elna BreslowEappen , who saw patient yesterday, and who agrees there are no current grounds for involuntary commitment .   Cognitive Features That Contribute To Risk:  No gross cognitive deficits noted upon discharge. Is alert , attentive, and oriented x 3    Suicide Risk:  Mild:  Suicidal ideation of limited frequency, intensity, duration, and specificity.  There are no identifiable plans, no associated intent, mild dysphoria and related symptoms, good self-control (both objective and subjective assessment), few other risk factors, and identifiable protective factors, including available and accessible social support.  Follow-up Information    [facility] Follow up on 09/22/2016.   Specialty:  Behavioral Health Why:  Wednesday at noon with Mr [facility].  Bring your ID and both insurance cards.  The number for the [facility] office is [phone] if for some reason it does not work out for you at the [facility] Contact information: [address] [place] [address] [phone]           Plan Of Care/Follow-up recommendations:  Activity:  as tolerated  Diet:  Regular  Tests:  NA Other:  See below Patient is requesting discharge, expressing readiness for discharge- there are no current grounds for involuntary commitment Patient plans to follow up as above . She agrees to return to ED if any significant worsening or any emergence of SI.  Craige CottaFernando A Cobos, MD 09/21/2016, 9:49 AM

## 2016-09-21 NOTE — Discharge Summary (Signed)
Physician Discharge Summary Note  Patient:  Tiffany LimerickDeborah M Castor is an 46 y.o., female MRN:  [id number] DOB:  01/21/1970 Patient phone:  [phone] (home)  Patient address:   [address] [place] [address],  Total Time spent with patient: 20 minutes  Date of Admission:  09/17/2016 Date of Discharge: 09/21/16   Reason for Admission:  Worsening depression and anxiety with SI  Principal Problem: Major depressive disorder, recurrent episode, severe [facility](HCC) Discharge Diagnoses: Patient Active Problem List   Diagnosis Date Noted  . Panic disorder [F41.0] 09/20/2016  . GAD (generalized anxiety disorder) [F41.1] 09/20/2016  . Cannabis use disorder, severe, dependence (HCC) [F12.20] 09/20/2016  . Opioid use disorder, severe, in sustained remission (HCC) [F11.21] 09/20/2016  . Major depressive disorder, recurrent episode, severe (HCC) [F33.2] 09/20/2016  . Arthritis of hip Right DDH [M16.10] 01/16/2011    Past Psychiatric History: GAD, MDD, Polysubstance abuse  Past Medical History:  Past Medical History:  Diagnosis Date  . Anxiety    takes lexapro  . Arthritis    djd  . Carpal tunnel syndrome, bilateral   . Chronic back pain   . Headache(784.0)    migraines related to stress  . PONV (postoperative nausea and vomiting)     Past Surgical History:  Procedure Laterality Date  . ABDOMINAL HYSTERECTOMY     2007  . ANTERIOR CRUCIATE LIGAMENT REPAIR    . CARPAL TUNNEL RELEASE  01/24/2012   Procedure: CARPAL TUNNEL RELEASE;  Surgeon: Nestor LewandowskyFrank J Rowan, MD;  Location: [facility];  Service: Orthopedics;  Laterality: Left;  . CERVIX SURGERY    . CESAREAN SECTION     1989  . KNEE ARTHROSCOPY    . TOTAL HIP ARTHROPLASTY  01/18/2011   Procedure: TOTAL HIP ARTHROPLASTY;  Surgeon: Nestor LewandowskyFrank J Rowan;  Location: [facility] OR;  Service: Orthopedics;  Laterality: Right;  DEPUY/PINNICAL POLY  . TUBAL LIGATION     1996   Family History:  Family History  Problem Relation Age of Onset  .  Alcohol abuse Mother   . Depression Mother   . Depression Sister   . Alcohol abuse Sister   . Depression Paternal Grandmother   . Drug abuse Son    Family Psychiatric  History: paternal grandmother: unknown however reports she was in a locked facility, Mother: Lynden Angtoh, depression reports codependences  to other illicit drug, father 47 y.o reports control an abusive issue, states he is was past Hotel managermilitary   Social History:  History  Alcohol Use  . Yes    Comment: occasionally     History  Drug Use  . Types: Marijuana    Social History   Social History  . Marital status: Divorced    Spouse name: N/A  . Number of children: N/A  . Years of education: N/A   Social History Main Topics  . Smoking status: Current Every Day Smoker    Packs/day: 0.50  . Smokeless tobacco: Never Used  . Alcohol use Yes     Comment: occasionally  . Drug use: Yes    Types: Marijuana  . Sexual activity: Not Currently   Other Topics Concern  . None   Social History Narrative  . None    Hospital Course:   Per ED admission note- The pt is a 46 y/o female - she has hx of significant and severe anxiety to the point where she was seen in the ED on 8/30, [facility] and back today. The  report was that the patient had been becoming more suicidal because of her worsening anxiety, she reports that she has been the primary caregiver for her father who is 2 with dementia and with whom she has to see 3 times a day when she goes to his house to take care of them. She reports that she cannot think straight, she has hallucinations which keep her paranoid though she cannot tell me what she is paranoid about, she denies active suicidal thoughts, she denies active homicidal thoughts, she in the same sentence states that she is worried that she might hurt herself but then denies any suicidality. It is difficult to get much of a straight story from her and she is very tangential. She does endorse trying  to drink a 12 pack of alcohol last night but eventually states that she opened one can after another pouring them out and spitting out the alcohol that she was trying to drink. 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  On evaluation: NORINA COWPER is awake, alert and oriented. Reports history of anxiety and panic attack. patient reports her medications is not helping with her symptoms. Patient present with pressured speech and is hyperverbal during this assessment.  Patient reports multiple stressors with caring for her father and states she is overwhelmed. 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 States she was taken suboxon  in the past however has been clean for the past year. ( opioids pain pills)  Support, encouragement and reassurance was provided.  Patient remained on the [facility] adult unit for 3 days. She was started on Mirtazapine, Increased her Seroquel, Started Effexor XR, and Gabapentin. She also had Vistaril for anxiety. Patient stabilized and was continued on medications for migraines. Patient remained hyper-verbal, but was pleasant and cooperative. She continued to deny any SI/HI/AVH. She was seen to be nervous and anxious on the [address] and patient was moved to [address] and patient stabilized quicker. Patient was provided with prescriptions and she agreed to follow up with outpatient services. She states she plans to remain clean and sober and she will be going to her house and her next door neighbor is picking her up. Patient has continued to deny SI/HI/AVH.  Physical Findings: AIMS: Facial and Oral Movements Muscles of Facial Expression: None, normal Lips and Perioral Area: None, normal Jaw: None, normal Tongue: None, normal,Extremity Movements Upper  (arms, wrists, hands, fingers): None, normal Lower (legs, knees, ankles, toes): None, normal, Trunk Movements Neck, shoulders, hips: None, normal, Overall Severity Severity of abnormal movements (highest score from questions above): None, normal Incapacitation due to abnormal movements: None, normal Patient's awareness of abnormal movements (rate only patient's report): No Awareness, Dental Status Current problems with teeth and/or dentures?: No Does patient usually wear dentures?: No  CIWA:  CIWA-Ar Total: 3 COWS:     Musculoskeletal: Strength & Muscle Tone: within normal limits Gait & Station: normal Patient leans: N/A  Psychiatric Specialty Exam: Physical Exam  Nursing note and vitals reviewed. Constitutional: She is oriented to person, place, and time. She appears well-developed and well-nourished.  Cardiovascular: Normal rate.   Respiratory: Effort normal.  Musculoskeletal: Normal range of motion.  Neurological: She is alert and oriented to person, place, and time.  Skin: Skin  is warm.    Review of Systems  Constitutional: Negative.   HENT: Negative.   Eyes: Negative.   Respiratory: Negative.   Cardiovascular: Negative.   Gastrointestinal: Negative.   Genitourinary: Negative.   Musculoskeletal: Negative.   Skin: Negative.   Neurological: Negative.   Endo/Heme/Allergies: Negative.     Blood pressure 132/85, pulse 91, temperature 98.2 F (36.8 C), temperature source Oral, resp. rate 16, height  (1.727 m), weight 66.9 kg (147 lb 8 oz).Body mass index is 22.43 kg/m.  General Appearance: Casual  Eye Contact:  Good  Speech:  Clear and Coherent and Normal Rate  Volume:  Normal  Mood:  Euthymic  Affect:  Appropriate  Thought Process:  Coherent and Descriptions of Associations: Intact  Orientation:  Full (Time, Place, and Person)  Thought Content:  WDL  Suicidal Thoughts:  No  Homicidal Thoughts:  No  Memory:  Immediate;   Good Recent;   Good Remote;   Good   Judgement:  Good  Insight:  Good  Psychomotor Activity:  Normal  Concentration:  Concentration: Good and Attention Span: Good  Recall:  Good  Fund of Knowledge:  Good  Language:  Good  Akathisia:  No  Handed:  Right  AIMS (if indicated):     Assets:  Desire for Improvement Financial Resources/Insurance Housing Social Support Transportation  ADL's:  Intact  Cognition:  WNL  Sleep:  Number of Hours: 4.25     Have you used any form of tobacco in the last 30 days? (Cigarettes, Smokeless Tobacco, Cigars, and/or Pipes): Yes  Has this patient used any form of tobacco in the last 30 days? (Cigarettes, Smokeless Tobacco, Cigars, and/or Pipes) Yes, Yes, A prescription for an FDA-approved tobacco cessation medication was offered at discharge and the patient refused  Blood Alcohol level:  Lab Results  Component Value Date   [facility] <5 09/17/2016    Metabolic Disorder Labs:  Lab Results  Component Value Date   HGBA1C 5.3 09/19/2016   MPG 105.41 09/19/2016   Lab Results  Component Value Date   PROLACTIN 19.1 09/19/2016   Lab Results  Component Value Date   CHOL 197 09/19/2016   TRIG 95 09/19/2016   HDL 69 09/19/2016   CHOLHDL 2.9 09/19/2016   VLDL 19 09/19/2016   LDLCALC 109 (H) 09/19/2016    See Psychiatric Specialty Exam and Suicide Risk Assessment completed by Attending Physician prior to discharge.  Discharge destination:  Home  Is patient on multiple antipsychotic therapies at discharge:  No   Has Patient had three or more failed trials of antipsychotic monotherapy by history:  No  Recommended Plan for Multiple Antipsychotic Therapies: NA   Allergies as of 09/21/2016      Reactions   Cephalexin Hives   Coumadin [warfarin Sodium] Swelling   Trazodone And Nefazodone Other (See Comments)   migraine      Medication List    STOP taking these medications   cyclobenzaprine 10 MG tablet Commonly known as:  FLEXERIL   levocetirizine 5 MG tablet Commonly known  as:  XYZAL   Melatonin 10 MG Tabs   promethazine 25 MG tablet Commonly known as:  PHENERGAN     TAKE these medications     Indication  gabapentin 100 MG capsule Commonly known as:  NEURONTIN Take 2 capsules (200 mg total) by mouth 3 (three) times daily. For withdrawal symtpoms  Indication:  Cocaine Dependence   hydrOXYzine 25 MG tablet Commonly known as:  ATARAX/VISTARIL Take 1 tablet (25  mg total) by mouth 3 (three) times daily as needed for anxiety.  Indication:  Feeling Anxious   mirtazapine 7.5 MG tablet Commonly known as:  REMERON Take 1 tablet (7.5 mg total) by mouth at bedtime. For mood control and sleep  Indication:  Major Depressive Disorder   PROAIR HFA 108 (90 Base) MCG/ACT inhaler Generic drug:  albuterol Inhale 2 puffs into the lungs daily as needed for shortness of breath.  Indication:  Asthma   QUEtiapine 50 MG tablet Commonly known as:  SEROQUEL Take 1 tablet (50 mg total) by mouth at bedtime. For mood control What changed:  medication strength  how much to take  additional instructions  Indication:  mood stability   SUMAtriptan 100 MG tablet Commonly known as:  IMITREX Take 1 tablet (100 mg total) by mouth daily as needed for migraine or headache. May repeat in 2 hours if headache persists or recurs. What changed:  reasons to take this  additional instructions  Another medication with the same name was removed. Continue taking this medication, and follow the directions you see here.  Indication:  Headache, Migraine Headache   topiramate 50 MG tablet Commonly known as:  TOPAMAX Take 1 tablet (50 mg total) by mouth 2 (two) times daily. For headaches What changed:  when to take this  additional instructions  Indication:  Migraine Headache   venlafaxine XR 37.5 MG 24 hr capsule Commonly known as:  EFFEXOR-XR Take 1 capsule (37.5 mg total) by mouth daily with breakfast. For mood control  Indication:  mood stability      Follow-up  Information    [facility] Follow up on 09/22/2016.   Specialty:  Behavioral Health Why:  Wednesday at noon with Mr [facility].  Bring your ID and both insurance cards.  The number for the [facility] office is [phone] if for some reason it does not work out for you at the [facility] information: [address] [place] [address] [phone]           Follow-up recommendations:  Continue activity as tolerated. Continue diet as recommended by your PCP. Ensure to keep all appointments with outpatient providers.  Comments:  Patient is instructed prior to discharge to: Take all medications as prescribed by his/her mental healthcare provider. Report any adverse effects and or reactions from the medicines to his/her outpatient provider promptly. Patient has been instructed & cautioned: To not engage in alcohol and or illegal drug use while on prescription medicines. In the event of worsening symptoms, patient is instructed to call the crisis hotline, 911 and or go to the nearest ED for appropriate evaluation and treatment of symptoms. To follow-up with his/her primary care provider for your other medical issues, concerns and or health care needs.    Signed: Gerlene Burdock Money, FNP 09/21/2016, 10:36 AM   Patient seen, Suicide Assessment Completed.  Disposition Plan Reviewed

## 2016-09-21 NOTE — Progress Notes (Signed)
Patient hyperverbal, tearful and anxious but states she knows she needs to discharge. "I'm so claustrophobic.I need to get home to my dog. Everyone calls me Tiffany Romero but you are calling me Tiffany Romero." Patient asking for benzos as she perceives neurontin and vistaril will not alleviate her anxiety. Patient encouraged to utilize "long list of coping skills I have in my room" and reminded she has an appt with [facility] tomorrow. AVS, transition record and SRA given along with prescriptions. All belongings returned. Patient verbalizes understanding. Denies SI/HI and assures this Clinical research associatewriter she will seek assistance should that change. Patient discharged ambulatory and in stable condition to neighbor.

## 2016-09-21 NOTE — Progress Notes (Signed)
Discharge note:  Patient discharged home per MD order.  Patient received all personal belongings from room and locker.  Reviewed AVS/transition record with patient and she indicated understanding.  Patient denies any thoughts of self harm.  Patient left with prescriptions of her medications.  Patient will be leaving with her neighbor.  She is ambulatory and in no physical or mental distress.

## 2016-09-21 NOTE — Progress Notes (Signed)
Patient ID: Tiffany Romero, female   DOB: 04/29/1970, 46 y.o.   MRN: [id number] PER STATE REGULATIONS 482.30  THIS CHART WAS REVIEWED FOR MEDICAL NECESSITY WITH RESPECT TO THE PATIENT'S ADMISSION/ DURATION OF STAY.  NEXT REVIEW DATE: 09/25/2016  Willa RoughJENNIFER JONES Tiffany Purington, RN, BSN CASE MANAGER

## 2016-09-21 NOTE — Progress Notes (Signed)
Adult Psychoeducational Group Note  Date:  09/21/2016 Time:  5:09 AM  Group Topic/Focus:  Wrap-Up Group:   The focus of this group is to help patients review their daily goal of treatment and discuss progress on daily workbooks.  Participation Level:  Active  Participation Quality:  Appropriate  Affect:  Appropriate  Cognitive:  Appropriate  Insight: Appropriate  Engagement in Group:  Engaged  Modes of Intervention:  Discussion  Additional Comments:  Pt stated her goal for today was to change from the [address] the [address]. Pt stated that she achieved her goal today and felt good about it. Pt rated her over all day a 8 of 10. Pt stated tomorrow goal was to talk with doctor about her discharge.   Felipa FurnaceChristopher  Eddi Hymes 09/21/2016, 5:09 AM

## 2016-09-21 NOTE — Progress Notes (Signed)
  [facility] Discharge Plan :  Will you be returning to the same living situation after discharge:  Yes,  home At discharge, do you have transportation home?: Yes,  friend Do you have the ability to pay for your medications: Yes,  insurance  Release of information consent forms completed and in the chart;  Patient's signature needed at discharge.  Patient to Follow up at: Follow-up Information    [facility] Follow up on 09/22/2016.   Specialty:  Behavioral Health Why:  Wednesday at noon with Mr [facility].  Bring your ID and both insurance cards.  The number for the [facility] office is [phone] if for some reason it does not work out for you at the [facility] Contact information: [address] [place] [address] [phone]           Next level of care provider has access to [facility] Link:no  Safety Planning and Suicide Prevention discussed: Yes,  yes  Have you used any form of tobacco in the last 30 days? (Cigarettes, Smokeless Tobacco, Cigars, and/or Pipes): Yes  Has patient been referred to the Quitline?: Patient refused referral  Patient has been referred for addiction treatment: Yes  Ida RogueRodney B Xylah Early, LCSW 09/21/2016, 8:10 AM

## 2016-09-21 NOTE — Progress Notes (Signed)
D: Pt denies SI/HI/AVH. Pt is pleasant and cooperative. Pt was a little hyperverbal. Pt appears to have some insight, pt stated she was having a lot of anxiety, writer attempted to assist pt with breathing and going over breathing exercises. Pt appeared to be a little less needy and manic this evening.    A: Pt was offered support and encouragement. Pt was given scheduled medications. Pt was encourage to attend groups. Q 15 minute checks were done for safety.    R:Pt attends groups and interacts well with peers and staff. Pt is taking medication. Pt has no complaints.Pt receptive to treatment and safety maintained on unit.

## 2016-09-22 ENCOUNTER — Inpatient Hospital Stay (HOSPITAL_COMMUNITY)
Admission: AC | Admit: 2016-09-22 | Discharge: 2016-09-28 | Disposition: A | Discharge status: Home / Self Care | Payer: Medicare Other | Source: Home / Self Care | Attending: Psychiatry | Admitting: Psychiatry

## 2016-09-22 ENCOUNTER — Encounter (HOSPITAL_COMMUNITY): Payer: Self-pay

## 2016-09-22 DIAGNOSIS — F1121 Opioid dependence, in remission: Secondary | ICD-10-CM | POA: Diagnosis present

## 2016-09-22 DIAGNOSIS — G43909 Migraine, unspecified, not intractable, without status migrainosus: Secondary | ICD-10-CM

## 2016-09-22 DIAGNOSIS — F410 Panic disorder [episodic paroxysmal anxiety] without agoraphobia: Secondary | ICD-10-CM

## 2016-09-22 DIAGNOSIS — F332 Major depressive disorder, recurrent severe without psychotic features: Secondary | ICD-10-CM | POA: Insufficient documentation

## 2016-09-22 DIAGNOSIS — F1220 Cannabis dependence, uncomplicated: Secondary | ICD-10-CM | POA: Diagnosis present

## 2016-09-22 DIAGNOSIS — Z818 Family history of other mental and behavioral disorders: Secondary | ICD-10-CM

## 2016-09-22 DIAGNOSIS — Z96641 Presence of right artificial hip joint: Secondary | ICD-10-CM | POA: Diagnosis present

## 2016-09-22 DIAGNOSIS — J45909 Unspecified asthma, uncomplicated: Secondary | ICD-10-CM | POA: Diagnosis present

## 2016-09-22 DIAGNOSIS — F17210 Nicotine dependence, cigarettes, uncomplicated: Secondary | ICD-10-CM

## 2016-09-22 DIAGNOSIS — F3163 Bipolar disorder, current episode mixed, severe, without psychotic features: Secondary | ICD-10-CM | POA: Diagnosis present

## 2016-09-22 DIAGNOSIS — Z811 Family history of alcohol abuse and dependence: Secondary | ICD-10-CM

## 2016-09-22 DIAGNOSIS — F319 Bipolar disorder, unspecified: Secondary | ICD-10-CM | POA: Diagnosis present

## 2016-09-22 DIAGNOSIS — F411 Generalized anxiety disorder: Secondary | ICD-10-CM | POA: Diagnosis present

## 2016-09-22 LAB — URINALYSIS, ROUTINE W REFLEX MICROSCOPIC
Bilirubin Urine: NEGATIVE
GLUCOSE, UA: NEGATIVE mg/dL
Ketones, ur: NEGATIVE mg/dL
NITRITE: NEGATIVE
PH: 7 (ref 5.0–8.0)
Protein, ur: NEGATIVE mg/dL
SPECIFIC GRAVITY, URINE: 1.006 (ref 1.005–1.030)

## 2016-09-22 LAB — CBC
HEMATOCRIT: 44.7 % (ref 36.0–46.0)
Hemoglobin: 15.5 g/dL — ABNORMAL HIGH (ref 12.0–15.0)
MCH: 30.2 pg (ref 26.0–34.0)
MCHC: 34.7 g/dL (ref 30.0–36.0)
MCV: 87.1 fL (ref 78.0–100.0)
Platelets: 205 10*3/uL (ref 150–400)
RBC: 5.13 MIL/uL — ABNORMAL HIGH (ref 3.87–5.11)
RDW: 13.5 % (ref 11.5–15.5)
WBC: 12.9 10*3/uL — AB (ref 4.0–10.5)

## 2016-09-22 LAB — COMPREHENSIVE METABOLIC PANEL
ALBUMIN: 4.8 g/dL (ref 3.5–5.0)
ALK PHOS: 65 U/L (ref 38–126)
ALT: 15 U/L (ref 14–54)
AST: 15 U/L (ref 15–41)
Anion gap: 9 (ref 5–15)
BILIRUBIN TOTAL: 0.6 mg/dL (ref 0.3–1.2)
BUN: 8 mg/dL (ref 6–20)
CALCIUM: 9.4 mg/dL (ref 8.9–10.3)
CO2: 22 mmol/L (ref 22–32)
CREATININE: 0.76 mg/dL (ref 0.44–1.00)
Chloride: 108 mmol/L (ref 101–111)
GFR calc Af Amer: >60 mL/min (ref >60)
GLUCOSE: 105 mg/dL — AB (ref 65–99)
Potassium: 3.6 mmol/L (ref 3.5–5.1)
Sodium: 139 mmol/L (ref 135–145)
TOTAL PROTEIN: 7.1 g/dL (ref 6.5–8.1)

## 2016-09-22 LAB — RAPID URINE DRUG SCREEN, HOSP PERFORMED
AMPHETAMINES: NOT DETECTED
BARBITURATES: NOT DETECTED
BENZODIAZEPINES: NOT DETECTED
Cocaine: NOT DETECTED
Opiates: NOT DETECTED
Tetrahydrocannabinol: POSITIVE — AB

## 2016-09-22 LAB — ETHANOL

## 2016-09-22 MED ORDER — MIRTAZAPINE 15 MG PO TABS
ORAL_TABLET | ORAL | Status: AC
  Filled 2016-09-22: qty 1

## 2016-09-22 MED ORDER — SUMATRIPTAN SUCCINATE 100 MG PO TABS
100.0000 mg | ORAL_TABLET | Freq: Every day | ORAL | Status: DC | PRN
  Administered 2016-09-22 – 2016-09-26 (×4): 100 mg via ORAL
  Filled 2016-09-22 (×4): qty 2

## 2016-09-22 MED ORDER — HYDROXYZINE HCL 25 MG PO TABS
25.0000 mg | ORAL_TABLET | Freq: Three times a day (TID) | ORAL | Status: DC | PRN
  Administered 2016-09-22 – 2016-09-23 (×2): 25 mg via ORAL
  Filled 2016-09-22 (×2): qty 1

## 2016-09-22 MED ORDER — QUETIAPINE FUMARATE 50 MG PO TABS
50.0000 mg | ORAL_TABLET | Freq: Every day | ORAL | Status: DC
  Administered 2016-09-22: 50 mg via ORAL
  Filled 2016-09-22 (×3): qty 1

## 2016-09-22 MED ORDER — ALBUTEROL SULFATE HFA 108 (90 BASE) MCG/ACT IN AERS
2.0000 | INHALATION_SPRAY | Freq: Every day | RESPIRATORY_TRACT | Status: DC | PRN
  Administered 2016-09-27: 2 via RESPIRATORY_TRACT
  Filled 2016-09-22: qty 6.7

## 2016-09-22 MED ORDER — OLANZAPINE 5 MG PO TBDP
5.0000 mg | ORAL_TABLET | Freq: Once | ORAL | Status: AC | PRN
  Administered 2016-09-22: 5 mg via ORAL
  Filled 2016-09-22: qty 1

## 2016-09-22 MED ORDER — GABAPENTIN 100 MG PO CAPS
200.0000 mg | ORAL_CAPSULE | Freq: Three times a day (TID) | ORAL | Status: DC
Start: 2016-09-22 — End: 2016-09-24
  Administered 2016-09-22 – 2016-09-24 (×5): 200 mg via ORAL
  Filled 2016-09-22 (×10): qty 2

## 2016-09-22 MED ORDER — ALUM & MAG HYDROXIDE-SIMETH 200-200-20 MG/5ML PO SUSP: 30.0000 mL | ORAL | Status: DC | PRN

## 2016-09-22 MED ORDER — ACETAMINOPHEN 325 MG PO TABS
650.0000 mg | ORAL_TABLET | Freq: Four times a day (QID) | ORAL | Status: DC | PRN
  Administered 2016-09-22 – 2016-09-26 (×3): 650 mg via ORAL
  Filled 2016-09-22 (×3): qty 2

## 2016-09-22 MED ORDER — NICOTINE 21 MG/24HR TD PT24
21.0000 mg | MEDICATED_PATCH | Freq: Every day | TRANSDERMAL | Status: DC
  Administered 2016-09-23 – 2016-09-28 (×6): 21 mg via TRANSDERMAL
  Filled 2016-09-22 (×9): qty 1

## 2016-09-22 MED ORDER — VENLAFAXINE HCL ER 37.5 MG PO CP24
37.5000 mg | ORAL_CAPSULE | Freq: Every day | ORAL | Status: DC
  Administered 2016-09-23: 37.5 mg via ORAL
  Filled 2016-09-22 (×2): qty 1

## 2016-09-22 MED ORDER — TOPIRAMATE 25 MG PO TABS
50.0000 mg | ORAL_TABLET | Freq: Two times a day (BID) | ORAL | Status: DC
Start: 2016-09-22 — End: 2016-09-28
  Administered 2016-09-22 – 2016-09-28 (×12): 50 mg via ORAL
  Filled 2016-09-22 (×18): qty 2

## 2016-09-22 MED ORDER — MIRTAZAPINE 7.5 MG PO TABS
7.5000 mg | ORAL_TABLET | Freq: Every day | ORAL | Status: DC
Start: 2016-09-22 — End: 2016-09-23
  Administered 2016-09-22: 7.5 mg via ORAL
  Filled 2016-09-22 (×2): qty 1

## 2016-09-22 MED ORDER — QUETIAPINE FUMARATE 25 MG PO TABS
25.0000 mg | ORAL_TABLET | Freq: Three times a day (TID) | ORAL | Status: DC | PRN
  Administered 2016-09-23 – 2016-09-24 (×2): 25 mg via ORAL
  Filled 2016-09-22 (×2): qty 1

## 2016-09-22 MED ORDER — MAGNESIUM HYDROXIDE 400 MG/5ML PO SUSP
30.0000 mL | Freq: Every day | ORAL | Status: DC | PRN
Start: 2016-09-22 — End: 2016-09-28
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
## 2016-09-22 NOTE — Plan of Care (Signed)
Problem: Safety: Goal: Periods of time without injury will increase Outcome: Progressing Patient is on q15 minute safety checks and low fall risk precautions. Patient contracts for safety on the unit and remains safe at this time.   

## 2016-09-22 NOTE — Progress Notes (Signed)
Nursing Progress Note 1900-0730  D) Patient presents with anxious, depressed mood and affect. Patient requests medications and nighttime orders provided at 2000. Patient denied any issues or concerns for writer this evening and states, "I just want to be in my room right now". Patient denied SI/HI/AVH or pain and contracted for safety on the unit. Opportunity for snacks/fluids provided, patient encouraged to attend group. Patient was isolative to her room.  A) Emotional support given. 1:1 interaction and active listening provided. Patient medicated as prescribed. Medications and plan of care reviewed with patient. Patient verbalized understanding without further questions. Snacks and fluids provided. Opportunities for questions or concerns presented to patient. Patient encouraged to continue to work on treatment goals. Labs, vital signs and patient behavior monitored throughout shift. Patient safety maintained with q15 min safety checks. Low fall risk precautions in place and reviewed with patient; patient verbalized understanding.  R) Patient receptive to interaction with nurse. Patient remains safe on the unit at this time. Patient denies any adverse medication reactions at this time. Patient is resting in bed without complaints. Will continue to monitor.

## 2016-09-22 NOTE — H&P (Signed)
Behavioral Health Medical Screening Exam  Tiffany Romero is an 46 y.o. female.  Total Time spent with patient: 30 minutes  Psychiatric Specialty Exam: Physical Exam  Neck: Normal range of motion.  Cardiovascular: Normal rate and regular rhythm.   Respiratory: Effort normal.    ROS  Blood pressure (!) 125/93, pulse (!) 102, temperature 98.4 F (36.9 C), temperature source Oral, resp. rate 18.There is no height or weight on file to calculate BMI.  General Appearance: Casual and Fairly Groomed  Eye Contact:  Good  Speech:  Clear and Coherent and Pressured  Volume:  Normal  Mood:  Anxious  Affect:  Labile  Thought Process:  Coherent and Goal Directed  Orientation:  Full (Time, Place, and Person)  Thought Content:  Rumination and Denies hallucinations, delusions, paranoia  Suicidal Thoughts:  Passive  Homicidal Thoughts:  No  Memory:  Immediate;   Good Recent;   Good Remote;   Good  Judgement:  Fair  Insight:  Fair  Psychomotor Activity:  Restlessness  Concentration: Concentration: Good and Attention Span: Good  Recall:  Good  Fund of Knowledge:Fair  Language: Good  Akathisia:  No  Handed:  Right  AIMS (if indicated):     Assets:  Communication Skills Desire for Improvement Social Support  Sleep:       Musculoskeletal: Strength & Muscle Tone: within normal limits Gait & Station: normal Patient leans: N/A  Blood pressure (!) 125/93, pulse (!) 102, temperature 98.4 F (36.9 C), temperature source Oral, resp. rate 18.  Recommendations: Readmission inpatient psychiatric treatment.  [facility]  Based on my evaluation the patient does not appear to have an emergency medical condition.  Rilda Bulls, NP 09/22/2016, 3:55 PM
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
## 2016-09-22 NOTE — Progress Notes (Signed)
Patient informed admitting nurse that her keys and cell phone are in her unlocked SUV in the parking lot. Reports they are in the console. Accompanied by Orvilla Fusommy in security to car that [facility]. Cell phone located in console however no evidence of keys anywhere in vehicle. Locker searched with security and keys are not with belongings. Cell phone was secured by security and added to the search sheet.

## 2016-09-22 NOTE — Progress Notes (Signed)
Patient ID: Tiffany Romero, female   DOB: 11/15/1970, 46 y.o.   MRN: [id number] PER STATE REGULATIONS 482.30  THIS CHART WAS REVIEWED FOR MEDICAL NECESSITY WITH RESPECT TO THE PATIENT'S ADMISSION/DURATION OF STAY.  NEXT REVIEW DATE: 09/26/16  Loura HaltBARBARA Blanka Rockholt, RN, BSN CASE MANAGER

## 2016-09-22 NOTE — Progress Notes (Signed)
Tiffany Romero is a 46 year old female being readmitted to [facility]  [facility] OP provider and he told her to come back here.  She was labile, scared, tangential, rapid speech and very tearful.  She stated that she is scared and needed help.  She denies any SI/HI or A/V hallucinations.  Oriented her to the unit.  Admission paperwork completed and signed.  Belongings searched and secured in locker # 13.  Skin assessment completed and noted boil under her right armpit.  Q 15 minute checks initiated for safety.  We will monitor the progress towards her goals.

## 2016-09-23 ENCOUNTER — Encounter (HOSPITAL_COMMUNITY): Payer: Self-pay | Admitting: Psychiatry

## 2016-09-23 ENCOUNTER — Ambulatory Visit (HOSPITAL_COMMUNITY): Payer: Self-pay | Admitting: Psychiatry

## 2016-09-23 MED ORDER — QUETIAPINE FUMARATE 200 MG PO TABS
200.0000 mg | ORAL_TABLET | Freq: Every day | ORAL | Status: DC
  Administered 2016-09-24: 200 mg via ORAL
  Filled 2016-09-23 (×2): qty 1

## 2016-09-23 MED ORDER — OLANZAPINE 5 MG PO TBDP
5.0000 mg | ORAL_TABLET | Freq: Once | ORAL | Status: AC | PRN
  Administered 2016-09-23: 5 mg via ORAL
  Filled 2016-09-23: qty 1

## 2016-09-23 NOTE — Plan of Care (Signed)
Problem: Health Behavior/Discharge Planning: Goal: Compliance with prescribed medication regimen will improve Outcome: Progressing Patient is complying with medication regimen today, she is swallowing all of her medications as well as drinking the whole cup of water along with it.
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
## 2016-09-23 NOTE — Progress Notes (Signed)
[facility] Group Notes:  (Nursing/MHT/Case Management/Adjunct)  Date:  09/23/2016  Time:  0930  Type of Therapy:  Nurse Education  Participation Level:  Active  Participation Quality:  Monopolizing  Affect:  Anxious  Cognitive:  Alert and Confused  Insight:  Lacking  Engagement in Group:  Monopolizing  Modes of Intervention:  Activity, Discussion, Education, Socialization and Support  Summary of Progress/Problems:The purpose of this group is to support patients to identify a goal for today and introduce them to the benefits of aromatherapy. Pt's goal for today "I know what I need and what I want to do," "help with anger, "communicate with staff," "walk out door because I'm Consolidated EdisonDebbie Romero."Tiffany Romero, Tiffany Romero 09/23/2016, 2:52 PM
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
## 2016-09-23 NOTE — [facility] Group Notes (Signed)
LCSW Group Therapy Note   09/23/2016 1:15pm   Type of Therapy and Topic:  Group Therapy:  Positive Affirmations   Participation Level:  Minimal  Description of Group: This group addressed positive affirmation toward self and others. Patients went around the room and identified two positive things about themselves and two positive things about a peer in the room. Patients reflected on how it felt to share something positive with others, to identify positive things about themselves, and to hear positive things from others. Patients were encouraged to have a daily reflection of positive characteristics or circumstances.  Therapeutic Goals 1. Patient will verbalize two of their positive qualities 2. Patient will demonstrate empathy for others by stating two positive qualities about a peer in the group 3. Patient will verbalize their feelings when voicing positive self affirmations and when voicing positive affirmations of others 4. Patients will discuss the potential positive impact on their wellness/recovery of focusing on positive traits of self and others. Summary of Patient Progress:  Tiffany PoundDeborah attended group and remained there the entire time. She actively listened to the story but chose not to share her story and stated that "I have everything under control now and I do not need to be here".    Therapeutic Modalities Cognitive Behavioral Therapy Motivational Interviewing  [name] M Michaeal Davis, Student-Social Work 09/23/2016 2:49 PM
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
## 2016-09-23 NOTE — Progress Notes (Addendum)
Patient is very anxious this morning and intrusive with peers in the dayroom. Patient asks to have 1:1 discussion with Clinical research associatewriter and is asking about discharge. Patient reports, "I just calmed this woman down, she thought everyone was upset with her". Patient reports, "I am feeling well enough to go home, I'm not suicidal. I need you to take this orange dot off because I'm not suicidal. You need to give me my shoes so I can go home". Patient with pressured, hyperverbal speech and tangential thoughts. Patient is disorganized with a flight of ideas. Patient reaching for writer's badge multiple times and states, "you have the keys to let me go home. I know it's not the locks". Charge [facility], [facility] and staff notified. Patient not responding to questions appropriately and is preoccupied with discharge this morning. Patient directed away from peers. Frequent redirection necessary. Support and encouragement provided. Will continue to monitor.

## 2016-09-23 NOTE — H&P (Addendum)
Psychiatric Admission Assessment Adult  Patient Identification: Tiffany Romero MRN:  [id number] Date of Evaluation:  09/23/2016 Chief Complaint:  BIPOLAR DISORDER GAD Principal Diagnosis: Bipolar disorder ([place]) Diagnosis:   Patient Active Problem List   Diagnosis Date Noted  . MDD (major depressive disorder), recurrent severe, without psychosis ([place]) [F33.2] 09/22/2016  . Panic disorder [F41.0] 09/20/2016  . GAD (generalized anxiety disorder) [F41.1] 09/20/2016  . Cannabis use disorder, severe, dependence ([place]) [F12.20] 09/20/2016  . Opioid use disorder, severe, in sustained remission ([place]) [F11.21] 09/20/2016  . Major depressive disorder, recurrent episode, severe ([place]) [F33.2] 09/20/2016  . Bipolar disorder ([place]) [F31.9] 09/17/2016  . Arthritis of hip Right DDH [M16.10] 01/16/2011   History of Present Illness:  46 y.o Caucasian female. History of Bipolar Disorder. Discharged from our unit on 09/21/16. Represented the following day in a panic mode. Was supposed to start IOP at the [facility]. Was described as being manic there. Reported to be very irritable, labile and restless. Reported to be hyperactive and not able to sleep. No significant changes in her laboratory parameters since discharge.  At interview, patient is pressured and thought disordered. She has journals which depicts her thought process. Marked flight of ideas. Says she had a panic attack yesterday because it was the anniversary of family members that passed.  Says she is not suicidal. She does not have any thoughts of violence. Says she does not have any hallucinations. Patient says the panic has resolved and she hopes to leave soon.   Total Time spent with patient: 45 minutes  Past Psychiatric History: Bipolar disorder. Please refer to old notes for details.   Is the patient at risk to self? No.  Has the patient been a risk to self in the past 6 months? No.  Has the patient been a risk to self within the  distant past? Yes.    Is the patient a risk to others? No.  Has the patient been a risk to others in the past 6 months? No.  Has the patient been a risk to others within the distant past? No.   Prior Inpatient Therapy: Prior Inpatient Therapy: Yes Prior Therapy Dates: 09/2016 Prior Therapy Facilty/Provider(s): [facility] Reason for Treatment: manic, SI Prior Outpatient Therapy: Prior Outpatient Therapy: Yes Prior Therapy Dates: past Prior Therapy Facilty/Provider(s): [facility] Reason for Treatment: CDIOP Does patient have an ACCT team?: No Does patient have Intensive In-House Services?  : No Does patient have [facility] services? : No Does patient have P4CC services?: No  Alcohol Screening: 1. How often do you have a drink containing alcohol?: 2 to 4 times a month 2. How many drinks containing alcohol do you have on a typical day when you are drinking?: 1 or 2 3. How often do you have six or more drinks on one occasion?: Never Preliminary Score: 0 9. Have you or someone else been injured as a result of your drinking?: No 10. Has a relative or friend or a doctor or another health worker been concerned about your drinking or suggested you cut down?: No Alcohol Use Disorder Identification Test Final Score (AUDIT): 2 Brief Intervention: AUDIT score less than 7 or less-screening does not suggest unhealthy drinking-brief intervention not indicated Substance Abuse History in the last 12 months:  Yes.   Consequences of Substance Abuse: Unable to assess at this time.  Previous Psychotropic Medications: Yes  Psychological Evaluations: Yes  Past Medical History:  Past Medical History:  Diagnosis Date  . Anxiety    takes  lexapro  . Arthritis    djd  . Carpal tunnel syndrome, bilateral   . Chronic back pain   . Headache(784.0)    migraines related to stress  . PONV (postoperative nausea and vomiting)     Past Surgical History:  Procedure Laterality Date  . ABDOMINAL HYSTERECTOMY     2007  .  ANTERIOR CRUCIATE LIGAMENT REPAIR    . CARPAL TUNNEL RELEASE  01/24/2012   Procedure: CARPAL TUNNEL RELEASE;  Surgeon: Kerin Salen, MD;  Location: [place];  Service: Orthopedics;  Laterality: Left;  . CERVIX SURGERY    . CESAREAN SECTION     1989  . KNEE ARTHROSCOPY    . TOTAL HIP ARTHROPLASTY  01/18/2011   Procedure: TOTAL HIP ARTHROPLASTY;  Surgeon: Kerin Salen;  Location: [facility];  Service: Orthopedics;  Laterality: Right;  DEPUY/PINNICAL POLY  . TUBAL LIGATION     1996   Family History:  Family History  Problem Relation Age of Onset  . Alcohol abuse Mother   . Depression Mother   . Depression Sister   . Alcohol abuse Sister   . Depression Paternal Grandmother   . Drug abuse Son    Family Psychiatric  History: Please refer to old notes Tobacco Screening: Have you used any form of tobacco in the last 30 days? (Cigarettes, Smokeless Tobacco, Cigars, and/or Pipes): Yes Tobacco use, Select all that apply: 5 or more cigarettes per day Are you interested in Tobacco Cessation Medications?: Yes, will notify MD for an order Counseled patient on smoking cessation including recognizing danger situations, developing coping skills and basic information about quitting provided: Refused/Declined practical counseling Social History:  History  Alcohol Use  . Yes    Comment: occasionally     History  Drug Use  . Types: Marijuana    Additional Social History: Marital status: Divorced    Pain Medications: see MAR Prescriptions: see MAR Over the Counter: see MAR History of alcohol / drug use?: Yes Name of Substance 1: cannabis 1 - Age of First Use: UTA 1 - Amount (size/oz): UTA 1 - Frequency: UTA 1 - Duration: UTA 1 - Last Use / Amount: denies use since discharge from [facility] on 09/21/16 Name of Substance 2: opiates 2 - Age of First Use: UTA 2 - Amount (size/oz): UTA 2 - Frequency: UTA 2 - Duration: UTA 2 - Last Use / Amount: denies use since discharge from [facility] on  09/21/16                Allergies:   Allergies  Allergen Reactions  . Cephalexin Hives  . Coumadin [Warfarin Sodium] Swelling  . Trazodone And Nefazodone Other (See Comments)    migraine   Lab Results:  Results for orders placed or performed during the hospital encounter of 09/22/16 (from the past 48 hour(s))  Urine rapid drug screen (hosp performed)not at [facility]     Status: Abnormal   Collection Time: 09/22/16  5:03 PM  Result Value Ref Range   Opiates NONE DETECTED NONE DETECTED   Cocaine NONE DETECTED NONE DETECTED   Benzodiazepines NONE DETECTED NONE DETECTED   Amphetamines NONE DETECTED NONE DETECTED   Tetrahydrocannabinol POSITIVE (A) NONE DETECTED   Barbiturates NONE DETECTED NONE DETECTED    Comment:        DRUG SCREEN FOR MEDICAL PURPOSES ONLY.  IF CONFIRMATION IS NEEDED FOR ANY PURPOSE, NOTIFY LAB WITHIN 5 DAYS.        LOWEST DETECTABLE LIMITS FOR URINE  DRUG SCREEN Drug Class       Cutoff (ng/mL) Amphetamine      1000 Barbiturate      200 Benzodiazepine   836 Tricyclics       629 Opiates          300 Cocaine          300 THC              50 Performed at [facility], [place] [address], [place], [place] [address]   Urinalysis, Routine w reflex microscopic     Status: Abnormal   Collection Time: 09/22/16  5:03 PM  Result Value Ref Range   Color, Urine YELLOW YELLOW   APPearance CLOUDY (A) CLEAR   Specific Gravity, Urine 1.006 1.005 - 1.030   pH 7.0 5.0 - 8.0   Glucose, UA NEGATIVE NEGATIVE mg/dL   Hgb urine dipstick MODERATE (A) NEGATIVE   Bilirubin Urine NEGATIVE NEGATIVE   Ketones, ur NEGATIVE NEGATIVE mg/dL   Protein, ur NEGATIVE NEGATIVE mg/dL   Nitrite NEGATIVE NEGATIVE   Leukocytes, UA LARGE (A) NEGATIVE   RBC / HPF 0-5 0 - 5 RBC/hpf   WBC, UA TOO NUMEROUS TO COUNT 0 - 5 WBC/hpf   Bacteria, UA RARE (A) NONE SEEN   Squamous Epithelial / LPF 0-5 (A) NONE SEEN   WBC Clumps PRESENT    Mucus PRESENT     Comment: Performed at  [facility], [place] [address], [place], [place] [address]  Comprehensive metabolic panel     Status: Abnormal   Collection Time: 09/22/16  6:56 PM  Result Value Ref Range   Sodium 139 135 - 145 mmol/L   Potassium 3.6 3.5 - 5.1 mmol/L   Chloride 108 101 - 111 mmol/L   CO2 22 22 - 32 mmol/L   Glucose, Bld 105 (H) 65 - 99 mg/dL   BUN 8 6 - 20 mg/dL   Creatinine, Ser 0.76 0.44 - 1.00 mg/dL   Calcium 9.4 8.9 - 10.3 mg/dL   Total Protein 7.1 6.5 - 8.1 g/dL   Albumin 4.8 3.5 - 5.0 g/dL   AST 15 15 - 41 U/L   ALT 15 14 - 54 U/L   Alkaline Phosphatase 65 38 - 126 U/L   Total Bilirubin 0.6 0.3 - 1.2 mg/dL   GFR calc non Af Amer >60 >60 mL/min   GFR calc Af Amer >60 >60 mL/min    Comment: (NOTE) The eGFR has been calculated using the CKD EPI equation. This calculation has not been validated in all clinical situations. eGFR's persistently <60 mL/min signify possible Chronic Kidney Disease.    Anion gap 9 5 - 15    Comment: Performed at [facility], [place] [address], [place], [place] [address]  CBC     Status: Abnormal   Collection Time: 09/22/16  6:56 PM  Result Value Ref Range   WBC 12.9 (H) 4.0 - 10.5 K/uL   RBC 5.13 (H) 3.87 - 5.11 MIL/uL   Hemoglobin 15.5 (H) 12.0 - 15.0 g/dL   HCT 44.7 36.0 - 46.0 %   MCV 87.1 78.0 - 100.0 fL   MCH 30.2 26.0 - 34.0 pg   MCHC 34.7 30.0 - 36.0 g/dL   RDW 13.5 11.5 - 15.5 %   Platelets 205 150 - 400 K/uL    Comment: Performed at [facility], [place] [address], [place], [place] [address]  Ethanol     Status: None  Collection Time: 09/22/16  6:56 PM  Result Value Ref Range   Alcohol, Ethyl (B) <5 <5 mg/dL    Comment:        LOWEST DETECTABLE LIMIT FOR SERUM ALCOHOL IS 5 mg/dL FOR MEDICAL PURPOSES ONLY Performed at [place] [address] [place], [place] [address]     Blood Alcohol level:  Lab Results  Component Value Date   [facility] <5 09/22/2016   ETH <5  23/53/6144    Metabolic Disorder Labs:  Lab Results  Component Value Date   HGBA1C 5.3 09/19/2016   MPG 105.41 09/19/2016   Lab Results  Component Value Date   PROLACTIN 19.1 09/19/2016   Lab Results  Component Value Date   CHOL 197 09/19/2016   TRIG 95 09/19/2016   HDL 69 09/19/2016   CHOLHDL 2.9 09/19/2016   VLDL 19 09/19/2016   LDLCALC 109 (H) 09/19/2016    Current Medications: Current Facility-Administered Medications  Medication Dose Route Frequency Provider Last Rate Last Dose  . acetaminophen (TYLENOL) tablet 650 mg  650 mg Oral Q6H PRN Rankin, Shuvon B, NP   650 mg at 09/22/16 1644  . albuterol (PROVENTIL HFA;VENTOLIN HFA) 108 (90 Base) MCG/ACT inhaler 2 puff  2 puff Inhalation Daily PRN Rankin, Shuvon B, NP      . alum & mag hydroxide-simeth (MAALOX/MYLANTA) 200-200-20 MG/5ML suspension 30 mL  30 mL Oral Q4H PRN Rankin, Shuvon B, NP      . gabapentin (NEURONTIN) capsule 200 mg  200 mg Oral TID Rankin, Shuvon B, NP   200 mg at 09/23/16 0808  . hydrOXYzine (ATARAX/VISTARIL) tablet 25 mg  25 mg Oral TID PRN Rankin, Shuvon B, NP   25 mg at 09/23/16 0348  . magnesium hydroxide (MILK OF MAGNESIA) suspension 30 mL  30 mL Oral Daily PRN Rankin, Shuvon B, NP      . mirtazapine (REMERON) tablet 7.5 mg  7.5 mg Oral QHS Rankin, Shuvon B, NP   7.5 mg at 09/22/16 2000  . nicotine (NICODERM CQ - dosed in mg/24 hours) patch 21 mg  21 mg Transdermal Daily Hampton Abbot, MD   21 mg at 09/23/16 3154  . QUEtiapine (SEROQUEL) tablet 25 mg  25 mg Oral TID PRN Rankin, Shuvon B, NP   25 mg at 09/23/16 0413  . QUEtiapine (SEROQUEL) tablet 50 mg  50 mg Oral QHS Rankin, Shuvon B, NP   50 mg at 09/22/16 2000  . SUMAtriptan (IMITREX) tablet 100 mg  100 mg Oral Daily PRN Rankin, Shuvon B, NP   100 mg at 09/22/16 1644  . topiramate (TOPAMAX) tablet 50 mg  50 mg Oral BID Rankin, Shuvon B, NP   50 mg at 09/23/16 0808  . venlafaxine XR (EFFEXOR-XR) 24 hr capsule 37.5 mg  37.5 mg Oral Q breakfast  Rankin, Shuvon B, NP   37.5 mg at 09/23/16 0808   PTA Medications: Prescriptions Prior to Admission  Medication Sig Dispense Refill Last Dose  . albuterol (PROAIR HFA) 108 (90 Base) MCG/ACT inhaler Inhale 2 puffs into the lungs daily as needed for shortness of breath.   Not Taking  . gabapentin (NEURONTIN) 100 MG capsule Take 2 capsules (200 mg total) by mouth 3 (three) times daily. For withdrawal symtpoms 180 capsule 0   . hydrOXYzine (ATARAX/VISTARIL) 25 MG tablet Take 1 tablet (25 mg total) by mouth 3 (three) times daily as needed for anxiety. 30 tablet 0   . mirtazapine (REMERON) 7.5 MG tablet Take 1 tablet (  7.5 mg total) by mouth at bedtime. For mood control and sleep 30 tablet 0   . QUEtiapine (SEROQUEL) 50 MG tablet Take 1 tablet (50 mg total) by mouth at bedtime. For mood control 30 tablet 0   . SUMAtriptan (IMITREX) 100 MG tablet Take 1 tablet (100 mg total) by mouth daily as needed for migraine or headache. May repeat in 2 hours if headache persists or recurs. 10 tablet 0   . topiramate (TOPAMAX) 50 MG tablet Take 1 tablet (50 mg total) by mouth 2 (two) times daily. For headaches 60 tablet 0   . venlafaxine XR (EFFEXOR-XR) 37.5 MG 24 hr capsule Take 1 capsule (37.5 mg total) by mouth daily with breakfast. For mood control 30 capsule 0     Musculoskeletal: Strength & Muscle Tone: within normal limits Gait & Station: normal Patient leans: N/A  Psychiatric Specialty Exam: Physical Exam  Constitutional: She is oriented to person, place, and time. She appears well-developed and well-nourished.  HENT:  Head: Normocephalic and atraumatic.  Respiratory: Effort normal.  Neurological: She is alert and oriented to person, place, and time.  Psychiatric:  As above    ROS  Blood pressure 105/72, pulse 85, temperature 97.7 F (36.5 C), temperature source Oral, resp. rate 18, height 5' 8"  (1.727 m), weight 68 kg (150 lb).Body mass index is 22.81 kg/m.  General Appearance: Casually  dressed, limited grooming. Tense and hyperactive. Underlying irritability. Moderate engagement  Eye Contact:  Fair  Speech:  Pressured  Volume:  Normal  Mood:  Irritable  Affect:  Labile  Thought Process:  Flight of ideas.   Orientation:  Full (Time, Place, and Person)  Thought Content:  No violent theme. No delusional theme. No hallucination in any modality.   Suicidal Thoughts:  No  Homicidal Thoughts:  No  Memory:  Unable to assess as she is manic  Judgement:  Poor  Insight:  Fair  Psychomotor Activity:  Increased  Concentration:  Distractible.   Recall: Unable to assess at this time.   Fund of Knowledge:  Unable to assess at this time.   Language:  Fair  Akathisia:  Negative  Handed:    AIMS (if indicated):     Assets:  Physical Health Resilience  ADL's:  Fair  Cognition:  WNL  Sleep:  Number of Hours: 4.5    Treatment Plan Summary:  Patient is manic. She is not a danger to self or others but not able to function due to mania. I discussed adjusting her medications as listed below. She consented to treatment recommendation.  Psychiatric: Bipolar I Disorder ,,,, currently manic SUD  Medical: Migraine  Psychosocial:   PLAN: 1. Discontinue antidepressants (Effexor and Mirtazapine) 2. Would titrate Seroquel towards anti-manic dose 3. Continue other medical medications at current dose. 4. Not appropriate for groups as she is disruptive. Groups and unit activities as she gets better 5. Monitor mood, behavior and interaction with peers 6. SW would facilitate aftercare.    Observation Level/Precautions:    Laboratory:    Psychotherapy:    Medications:    Consultations:    Discharge Concerns:    Estimated LOS:  Other:     Physician Treatment Plan for Primary Diagnosis: Bipolar disorder ([place]) Long Term Goal(s): Improvement in symptoms so as ready for discharge  Short Term Goals: Ability to identify changes in lifestyle to reduce recurrence of condition will  improve, Ability to verbalize feelings will improve, Ability to disclose and discuss suicidal ideas, Ability to demonstrate self-control will  improve, Ability to identify and develop effective coping behaviors will improve, Ability to maintain clinical measurements within normal limits will improve, Compliance with prescribed medications will improve and Ability to identify triggers associated with substance abuse/mental health issues will improve  Physician Treatment Plan for Secondary Diagnosis: Principal Problem:   Bipolar disorder ([place])  Long Term Goal(s): Improvement in symptoms so as ready for discharge  Short Term Goals: Ability to identify changes in lifestyle to reduce recurrence of condition will improve, Ability to verbalize feelings will improve, Ability to disclose and discuss suicidal ideas, Ability to demonstrate self-control will improve, Ability to identify and develop effective coping behaviors will improve, Ability to maintain clinical measurements within normal limits will improve, Compliance with prescribed medications will improve and Ability to identify triggers associated with substance abuse/mental health issues will improve  I certify that inpatient services furnished can reasonably be expected to improve the patient's condition.    [place], MD 9/13/201811:41 AM

## 2016-09-23 NOTE — Progress Notes (Signed)
Did not attend group 

## 2016-09-23 NOTE — Progress Notes (Signed)
D- Patient very anxious, agitated, and tangential this morning stating that all she needed was some sleep and she's ready to go home. Pt is confused and disorganized in thought. Pt reports that she's tired of them "playing reverse psychology on me since I walked through that door". Pt's goal for today is to handle her anger and to communicate effectively with staff so "I can walk out that door and go home to my dog [name]". Pt is pacing the hallways with her daily packet pointing to what she wrote down stating that "I need to show this to the doctor so that I can get out of here". Pt denies SI stating that "I am not suicidal so you can take this orange dot off me because I know what that means". Patient denies HI, AVH, and pain.   A- Scheduled medications administered to patient, per MD orders. Support and encouragement provided.  Routine safety checks conducted every 15 minutes.  Patient informed to notify staff with problems or concerns.  R- No adverse drug reactions noted. Patient contracts for safety at this time. Patient compliant with medications. Patient interacts well with others on the unit at times and then proceeds to become very intrusive. Patient remains safe at this time.

## 2016-09-24 DIAGNOSIS — N3000 Acute cystitis without hematuria: Secondary | ICD-10-CM

## 2016-09-24 DIAGNOSIS — F1121 Opioid dependence, in remission: Secondary | ICD-10-CM

## 2016-09-24 DIAGNOSIS — F17210 Nicotine dependence, cigarettes, uncomplicated: Secondary | ICD-10-CM

## 2016-09-24 DIAGNOSIS — Z811 Family history of alcohol abuse and dependence: Secondary | ICD-10-CM

## 2016-09-24 DIAGNOSIS — F411 Generalized anxiety disorder: Secondary | ICD-10-CM

## 2016-09-24 DIAGNOSIS — F410 Panic disorder [episodic paroxysmal anxiety] without agoraphobia: Secondary | ICD-10-CM

## 2016-09-24 DIAGNOSIS — F1220 Cannabis dependence, uncomplicated: Secondary | ICD-10-CM

## 2016-09-24 DIAGNOSIS — F3113 Bipolar disorder, current episode manic without psychotic features, severe: Secondary | ICD-10-CM

## 2016-09-24 DIAGNOSIS — Z818 Family history of other mental and behavioral disorders: Secondary | ICD-10-CM

## 2016-09-24 DIAGNOSIS — Z813 Family history of other psychoactive substance abuse and dependence: Secondary | ICD-10-CM

## 2016-09-24 DIAGNOSIS — F39 Unspecified mood [affective] disorder: Secondary | ICD-10-CM

## 2016-09-24 MED ORDER — NITROFURANTOIN MONOHYD MACRO 100 MG PO CAPS
100.0000 mg | ORAL_CAPSULE | Freq: Two times a day (BID) | ORAL | Status: AC
  Administered 2016-09-24 – 2016-09-26 (×6): 100 mg via ORAL
  Filled 2016-09-24 (×6): qty 1

## 2016-09-24 MED ORDER — OLANZAPINE 5 MG PO TBDP
5.0000 mg | ORAL_TABLET | ORAL | Status: DC
  Administered 2016-09-25 – 2016-09-28 (×4): 5 mg via ORAL
  Filled 2016-09-24 (×6): qty 1

## 2016-09-24 MED ORDER — OLANZAPINE 5 MG PO TBDP
5.0000 mg | ORAL_TABLET | Freq: Once | ORAL | Status: AC
  Administered 2016-09-24: 5 mg via ORAL

## 2016-09-24 MED ORDER — CIPROFLOXACIN HCL 500 MG PO TABS: 500.0000 mg | ORAL_TABLET | Freq: Two times a day (BID) | ORAL | Status: DC

## 2016-09-24 MED ORDER — OLANZAPINE 5 MG PO TBDP
ORAL_TABLET | ORAL | Status: AC
  Filled 2016-09-24: qty 1

## 2016-09-24 MED ORDER — OLANZAPINE 10 MG PO TBDP
10.0000 mg | ORAL_TABLET | Freq: Every day | ORAL | Status: DC
  Administered 2016-09-24: 10 mg via ORAL
  Filled 2016-09-24 (×2): qty 1

## 2016-09-24 MED ORDER — GABAPENTIN 100 MG PO CAPS
100.0000 mg | ORAL_CAPSULE | Freq: Three times a day (TID) | ORAL | Status: DC
  Administered 2016-09-24 – 2016-09-28 (×13): 100 mg via ORAL
  Filled 2016-09-24 (×18): qty 1

## 2016-09-24 MED ORDER — LORAZEPAM 1 MG PO TABS
1.0000 mg | ORAL_TABLET | Freq: Two times a day (BID) | ORAL | Status: DC
  Administered 2016-09-24 – 2016-09-27 (×7): 1 mg via ORAL
  Filled 2016-09-24 (×7): qty 1

## 2016-09-24 MED ORDER — HYDROXYZINE HCL 50 MG PO TABS
50.0000 mg | ORAL_TABLET | Freq: Once | ORAL | Status: AC
  Administered 2016-09-24: 50 mg via ORAL
  Filled 2016-09-24 (×2): qty 1

## 2016-09-24 NOTE — Tx Team (Signed)
Interdisciplinary Treatment and Diagnostic Plan Update  09/24/2016 Time of Session: 8:31 AM  Tiffany Romero MRN: [id number]  Principal Diagnosis: Bipolar disorder [facility])  Secondary Diagnoses: Principal Problem:   Bipolar disorder ([place])   Current Medications:  Current Facility-Administered Medications  Medication Dose Route Frequency Provider Last Rate Last Dose  . acetaminophen (TYLENOL) tablet 650 mg  650 mg Oral Q6H PRN Rankin, Shuvon B, NP   650 mg at 09/24/16 0752  . albuterol (PROVENTIL HFA;VENTOLIN HFA) 108 (90 Base) MCG/ACT inhaler 2 puff  2 puff Inhalation Daily PRN Rankin, Shuvon B, NP      . alum & mag hydroxide-simeth (MAALOX/MYLANTA) 200-200-20 MG/5ML suspension 30 mL  30 mL Oral Q4H PRN Rankin, Shuvon B, NP      . gabapentin (NEURONTIN) capsule 200 mg  200 mg Oral TID Rankin, Shuvon B, NP   200 mg at 09/24/16 0749  . magnesium hydroxide (MILK OF MAGNESIA) suspension 30 mL  30 mL Oral Daily PRN Rankin, Shuvon B, NP      . nicotine (NICODERM CQ - dosed in mg/24 hours) patch 21 mg  21 mg Transdermal Daily Hampton Abbot, MD   21 mg at 09/24/16 0748  . QUEtiapine (SEROQUEL) tablet 200 mg  200 mg Oral QHS Izediuno, Vincent A, MD   200 mg at 09/24/16 0004  . QUEtiapine (SEROQUEL) tablet 25 mg  25 mg Oral TID PRN Rankin, Shuvon B, NP   25 mg at 09/24/16 0751  . SUMAtriptan (IMITREX) tablet 100 mg  100 mg Oral Daily PRN Rankin, Shuvon B, NP   100 mg at 09/24/16 0324  . topiramate (TOPAMAX) tablet 50 mg  50 mg Oral BID Rankin, Shuvon B, NP   50 mg at 09/24/16 0749    PTA Medications: Prescriptions Prior to Admission  Medication Sig Dispense Refill Last Dose  . albuterol (PROAIR HFA) 108 (90 Base) MCG/ACT inhaler Inhale 2 puffs into the lungs daily as needed for shortness of breath.   Not Taking  . gabapentin (NEURONTIN) 100 MG capsule Take 2 capsules (200 mg total) by mouth 3 (three) times daily. For withdrawal symtpoms 180 capsule 0   . hydrOXYzine (ATARAX/VISTARIL) 25 MG tablet  Take 1 tablet (25 mg total) by mouth 3 (three) times daily as needed for anxiety. 30 tablet 0   . mirtazapine (REMERON) 7.5 MG tablet Take 1 tablet (7.5 mg total) by mouth at bedtime. For mood control and sleep 30 tablet 0   . QUEtiapine (SEROQUEL) 50 MG tablet Take 1 tablet (50 mg total) by mouth at bedtime. For mood control 30 tablet 0   . SUMAtriptan (IMITREX) 100 MG tablet Take 1 tablet (100 mg total) by mouth daily as needed for migraine or headache. May repeat in 2 hours if headache persists or recurs. 10 tablet 0   . topiramate (TOPAMAX) 50 MG tablet Take 1 tablet (50 mg total) by mouth 2 (two) times daily. For headaches 60 tablet 0   . venlafaxine XR (EFFEXOR-XR) 37.5 MG 24 hr capsule Take 1 capsule (37.5 mg total) by mouth daily with breakfast. For mood control 30 capsule 0     Patient Stressors: Financial difficulties Loss of Sister and nephew, 4 years ago tomorrow Medication change or noncompliance Traumatic event  Patient Strengths: Capable of independent living General fund of knowledge Physical Health  Treatment Modalities: Medication Management, Group therapy, Case management,  1 to 1 session with clinician, Psychoeducation, Recreational therapy.   Physician Treatment Plan for Primary Diagnosis: Bipolar disorder ([place])  Long Term Goal(s): Improvement in symptoms so as ready for discharge  Short Term Goals: Ability to identify changes in lifestyle to reduce recurrence of condition will improve Ability to verbalize feelings will improve Ability to disclose and discuss suicidal ideas Ability to demonstrate self-control will improve Ability to identify and develop effective coping behaviors will improve Ability to maintain clinical measurements within normal limits will improve Compliance with prescribed medications will improve Ability to identify triggers associated with substance abuse/mental health issues will improve Ability to identify changes in lifestyle to reduce  recurrence of condition will improve Ability to verbalize feelings will improve Ability to disclose and discuss suicidal ideas Ability to demonstrate self-control will improve Ability to identify and develop effective coping behaviors will improve Ability to maintain clinical measurements within normal limits will improve Compliance with prescribed medications will improve Ability to identify triggers associated with substance abuse/mental health issues will improve  Medication Management: Evaluate patient's response, side effects, and tolerance of medication regimen.  Therapeutic Interventions: 1 to 1 sessions, Unit Group sessions and Medication administration.  Evaluation of Outcomes: Progressing  Physician Treatment Plan for Secondary Diagnosis: Principal Problem:   Bipolar disorder ([place])   Long Term Goal(s): Improvement in symptoms so as ready for discharge  Short Term Goals: Ability to identify changes in lifestyle to reduce recurrence of condition will improve Ability to verbalize feelings will improve Ability to disclose and discuss suicidal ideas Ability to demonstrate self-control will improve Ability to identify and develop effective coping behaviors will improve Ability to maintain clinical measurements within normal limits will improve Compliance with prescribed medications will improve Ability to identify triggers associated with substance abuse/mental health issues will improve Ability to identify changes in lifestyle to reduce recurrence of condition will improve Ability to verbalize feelings will improve Ability to disclose and discuss suicidal ideas Ability to demonstrate self-control will improve Ability to identify and develop effective coping behaviors will improve Ability to maintain clinical measurements within normal limits will improve Compliance with prescribed medications will improve Ability to identify triggers associated with substance abuse/mental  health issues will improve  Medication Management: Evaluate patient's response, side effects, and tolerance of medication regimen.  Therapeutic Interventions: 1 to 1 sessions, Unit Group sessions and Medication administration.  Evaluation of Outcomes: Progressing   RN Treatment Plan for Primary Diagnosis: Bipolar disorder ([place]) Long Term Goal(s): Knowledge of disease and therapeutic regimen to maintain health will improve  Short Term Goals: Ability to demonstrate self-control, Ability to identify and develop effective coping behaviors will improve and Compliance with prescribed medications will improve  Medication Management: RN will administer medications as ordered by provider, will assess and evaluate patient's response and provide education to patient for prescribed medication. RN will report any adverse and/or side effects to prescribing provider.  Therapeutic Interventions: 1 on 1 counseling sessions, Psychoeducation, Medication administration, Evaluate responses to treatment, Monitor vital signs and CBGs as ordered, Perform/monitor CIWA, COWS, AIMS and Fall Risk screenings as ordered, Perform wound care treatments as ordered.  Evaluation of Outcomes: Progressing   LCSW Treatment Plan for Primary Diagnosis: Bipolar disorder ([place]) Long Term Goal(s): Safe transition to appropriate next level of care at discharge, Engage patient in therapeutic group addressing interpersonal concerns.  Short Term Goals: Engage patient in aftercare planning with referrals and resources  Therapeutic Interventions: Assess for all discharge needs, 1 to 1 time with Social worker, Explore available resources and support systems, Assess for adequacy in community support network, Educate family and significant other(s) on suicide prevention,  Complete Psychosocial Assessment, Interpersonal group therapy.  Evaluation of Outcomes: Met  Return home, follow up outpt   Progress in Treatment: Attending groups:  Yes Participating in groups: Yes Taking medication as prescribed: Yes Toleration medication: Yes, no side effects reported at this time Family/Significant other contact made: No Patient understands diagnosis: No Limited insight Discussing patient identified problems/goals with staff: Yes Medical problems stabilized or resolved: Yes Denies suicidal/homicidal ideation: Yes Issues/concerns per patient self-inventory: None Other: N/A  New problem(s) identified: None identified at this time.   New Short Term/Long Term Goal(s): None identified at this time.   Discharge Plan or Barriers:   Reason for Continuation of Hospitalization: Mania  Medication stabilization Suicidal ideation   Estimated Length of Stay: 9/19  Attendees: Patient: 09/24/2016  8:31 AM  Physician: Leanord Hawking, MD 09/24/2016  8:31 AM  Nursing: Sena Hitch, RN 09/24/2016  8:31 AM  RN Care Manager: Lars Pinks, RN 09/24/2016  8:31 AM  Social Worker: Ripley Fraise 09/24/2016  8:31 AM  Recreational Therapist: Winfield Cunas 09/24/2016  8:31 AM  Other: Norberto Sorenson 09/24/2016  8:31 AM  Other:  09/24/2016  8:31 AM    Scribe for Treatment Team:  Roque Lias LCSW 09/24/2016 8:31 AM

## 2016-09-24 NOTE — Progress Notes (Signed)
Patient attended group and said that her day was a 9.  Her coping skill for today was working on herself.

## 2016-09-24 NOTE — Social Work (Signed)
Referred to [facility], is [place] resident.  Tiffany Covault, LCSW Lead Clinical Social Worker Phone:  [phone]  

## 2016-09-24 NOTE — [facility] Group Notes (Signed)
[facility] LCSW Group Therapy  09/24/2016  1:05 PM  Type of Therapy:  Group therapy  Participation Level:  Active  Participation Quality:  Attentive  Affect:  Flat  Cognitive:  Oriented  Insight:  Limited  Engagement in Therapy:  Limited  Modes of Intervention:  Discussion, Socialization  Summary of Progress/Problems:  Chaplain was here to lead a group on themes of hope and courage. Invited.  Chose to not attend.  Daryel Gerald B 09/24/2016 1:32 PM
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
## 2016-09-24 NOTE — Progress Notes (Signed)
Called by Dr. Jama Flavors regarding patient called, regarding treatment for UTI.  It is not clear if patient has symptoms, however does have leukocytosis and a urinalysis which show evidence of a urinary tract infection.  Can treat with nitrofurantoin (no interactions found with olanzapine) for 3-5 days.  I placed an order for nitrofurantoin for 3 days.  Will not continue to follow, please call back as needed. Thank you.  Costin M. Elvera Lennox, MD [facility] [phone]  If 7PM-7AM, please contact night-coverage [email or web address] Password [email or web address]

## 2016-09-24 NOTE — Progress Notes (Addendum)
[facility] MD Progress Note  09/24/2016 10:15 AM Tiffany Romero  MRN:  [id number] Subjective:  Patient reports she feels anxious and that she is " unhappy to be behind closed doors " Objective : I have discussed case with treatment team and have met with patient. Patient presents manic- she is pressured in speech, with flight of ideations and it is difficult to follow what she is saying at times . She presents expansive, jovial, restless,  and having difficulty with boundaries ( for example, requesting to Systems developer) . She does not present overtly irritable at this time. As discussed with staff, she seemed to respond well to Zyprexa Zydis , and was significantly calmer following administration. Denies medication side effects.  States she feels depressed about being in a " locked unit", but denies SI.  Of note, patient has (+) WBC in UA, rare bacteria. Denies dysuria, denies fever or chills   Principal Problem: Bipolar disorder ([place])- Manic  Diagnosis:   Patient Active Problem List   Diagnosis Date Noted  . MDD (major depressive disorder), recurrent severe, without psychosis ([place]) [F33.2] 09/22/2016  . Panic disorder [F41.0] 09/20/2016  . GAD (generalized anxiety disorder) [F41.1] 09/20/2016  . Cannabis use disorder, severe, dependence ([place]) [F12.20] 09/20/2016  . Opioid use disorder, severe, in sustained remission ([place]) [F11.21] 09/20/2016  . Major depressive disorder, recurrent episode, severe ([place]) [F33.2] 09/20/2016  . Bipolar disorder ([place]) [F31.9] 09/17/2016  . Arthritis of hip Right DDH [M16.10] 01/16/2011   Total Time spent with patient: 20 minutes  Past Medical History:  Past Medical History:  Diagnosis Date  . Anxiety    takes lexapro  . Arthritis    djd  . Carpal tunnel syndrome, bilateral   . Chronic back pain   . Headache(784.0)    migraines related to stress  . PONV (postoperative nausea and vomiting)     Past Surgical History:  Procedure Laterality Date  . ABDOMINAL  HYSTERECTOMY     2007  . ANTERIOR CRUCIATE LIGAMENT REPAIR    . CARPAL TUNNEL RELEASE  01/24/2012   Procedure: CARPAL TUNNEL RELEASE;  Surgeon: Kerin Salen, MD;  Location: [place];  Service: Orthopedics;  Laterality: Left;  . CERVIX SURGERY    . CESAREAN SECTION     1989  . KNEE ARTHROSCOPY    . TOTAL HIP ARTHROPLASTY  01/18/2011   Procedure: TOTAL HIP ARTHROPLASTY;  Surgeon: Kerin Salen;  Location: [place];  Service: Orthopedics;  Laterality: Right;  DEPUY/PINNICAL POLY  . TUBAL LIGATION     1996   Family History:  Family History  Problem Relation Age of Onset  . Alcohol abuse Mother   . Depression Mother   . Depression Sister   . Alcohol abuse Sister   . Depression Paternal Grandmother   . Drug abuse Son    Social History:  History  Alcohol Use  . Yes    Comment: occasionally     History  Drug Use  . Types: Marijuana    Social History   Social History  . Marital status: Divorced    Spouse name: N/A  . Number of children: N/A  . Years of education: N/A   Social History Main Topics  . Smoking status: Current Every Day Smoker    Packs/day: 0.50  . Smokeless tobacco: Never Used  . Alcohol use Yes     Comment: occasionally  . Drug use: Yes    Types: Marijuana  . Sexual activity: Not Currently  Other Topics Concern  . None   Social History Narrative  . None   Additional Social History:    Pain Medications: see MAR Prescriptions: see MAR Over the Counter: see MAR History of alcohol / drug use?: Yes Name of Substance 1: cannabis 1 - Age of First Use: UTA 1 - Amount (size/oz): UTA 1 - Frequency: UTA 1 - Duration: UTA 1 - Last Use / Amount: denies use since discharge from [facility] on 09/21/16 Name of Substance 2: opiates 2 - Age of First Use: UTA 2 - Amount (size/oz): UTA 2 - Frequency: UTA 2 - Duration: UTA 2 - Last Use / Amount: denies use since discharge from [facility] on 09/21/16  Sleep: poor sleep, but did sleep " a little  more"  Appetite:  Fair  Current Medications: Current Facility-Administered Medications  Medication Dose Route Frequency Provider Last Rate Last Dose  . OLANZapine zydis (ZYPREXA) 5 MG disintegrating tablet           . acetaminophen (TYLENOL) tablet 650 mg  650 mg Oral Q6H PRN Rankin, Shuvon B, NP   650 mg at 09/24/16 0752  . albuterol (PROVENTIL HFA;VENTOLIN HFA) 108 (90 Base) MCG/ACT inhaler 2 puff  2 puff Inhalation Daily PRN Rankin, Shuvon B, NP      . alum & mag hydroxide-simeth (MAALOX/MYLANTA) 200-200-20 MG/5ML suspension 30 mL  30 mL Oral Q4H PRN Rankin, Shuvon B, NP      . gabapentin (NEURONTIN) capsule 200 mg  200 mg Oral TID Rankin, Shuvon B, NP   200 mg at 09/24/16 0749  . magnesium hydroxide (MILK OF MAGNESIA) suspension 30 mL  30 mL Oral Daily PRN Rankin, Shuvon B, NP      . nicotine (NICODERM CQ - dosed in mg/24 hours) patch 21 mg  21 mg Transdermal Daily Hampton Abbot, MD   21 mg at 09/24/16 0748  . QUEtiapine (SEROQUEL) tablet 200 mg  200 mg Oral QHS Izediuno, Vincent A, MD   200 mg at 09/24/16 0004  . QUEtiapine (SEROQUEL) tablet 25 mg  25 mg Oral TID PRN Rankin, Shuvon B, NP   25 mg at 09/24/16 0751  . SUMAtriptan (IMITREX) tablet 100 mg  100 mg Oral Daily PRN Rankin, Shuvon B, NP   100 mg at 09/24/16 0324  . topiramate (TOPAMAX) tablet 50 mg  50 mg Oral BID Rankin, Shuvon B, NP   50 mg at 09/24/16 4827    Lab Results:  Results for orders placed or performed during the hospital encounter of 09/22/16 (from the past 48 hour(s))  Urine rapid drug screen (hosp performed)not at [facility]     Status: Abnormal   Collection Time: 09/22/16  5:03 PM  Result Value Ref Range   Opiates NONE DETECTED NONE DETECTED   Cocaine NONE DETECTED NONE DETECTED   Benzodiazepines NONE DETECTED NONE DETECTED   Amphetamines NONE DETECTED NONE DETECTED   Tetrahydrocannabinol POSITIVE (A) NONE DETECTED   Barbiturates NONE DETECTED NONE DETECTED    Comment:        DRUG SCREEN FOR MEDICAL  PURPOSES ONLY.  IF CONFIRMATION IS NEEDED FOR ANY PURPOSE, NOTIFY LAB WITHIN 5 DAYS.        LOWEST DETECTABLE LIMITS FOR URINE DRUG SCREEN Drug Class       Cutoff (ng/mL) Amphetamine      1000 Barbiturate      200 Benzodiazepine   078 Tricyclics       675 Opiates          300  Cocaine          300 THC              50 Performed at [place] [address] [place], [place] [address]   Urinalysis, Routine w reflex microscopic     Status: Abnormal   Collection Time: 09/22/16  5:03 PM  Result Value Ref Range   Color, Urine YELLOW YELLOW   APPearance CLOUDY (A) CLEAR   Specific Gravity, Urine 1.006 1.005 - 1.030   pH 7.0 5.0 - 8.0   Glucose, UA NEGATIVE NEGATIVE mg/dL   Hgb urine dipstick MODERATE (A) NEGATIVE   Bilirubin Urine NEGATIVE NEGATIVE   Ketones, ur NEGATIVE NEGATIVE mg/dL   Protein, ur NEGATIVE NEGATIVE mg/dL   Nitrite NEGATIVE NEGATIVE   Leukocytes, UA LARGE (A) NEGATIVE   RBC / HPF 0-5 0 - 5 RBC/hpf   WBC, UA TOO NUMEROUS TO COUNT 0 - 5 WBC/hpf   Bacteria, UA RARE (A) NONE SEEN   Squamous Epithelial / LPF 0-5 (A) NONE SEEN   WBC Clumps PRESENT    Mucus PRESENT     Comment: Performed at [facility], [place] [address] [place], [place] [address]  Comprehensive metabolic panel     Status: Abnormal   Collection Time: 09/22/16  6:56 PM  Result Value Ref Range   Sodium 139 135 - 145 mmol/L   Potassium 3.6 3.5 - 5.1 mmol/L   Chloride 108 101 - 111 mmol/L   CO2 22 22 - 32 mmol/L   Glucose, Bld 105 (H) 65 - 99 mg/dL   BUN 8 6 - 20 mg/dL   Creatinine, Ser 0.76 0.44 - 1.00 mg/dL   Calcium 9.4 8.9 - 10.3 mg/dL   Total Protein 7.1 6.5 - 8.1 g/dL   Albumin 4.8 3.5 - 5.0 g/dL   AST 15 15 - 41 U/L   ALT 15 14 - 54 U/L   Alkaline Phosphatase 65 38 - 126 U/L   Total Bilirubin 0.6 0.3 - 1.2 mg/dL   GFR calc non Af Amer >60 >60 mL/min   GFR calc Af Amer >60 >60 mL/min    Comment: (NOTE) The eGFR has been calculated using the CKD EPI  equation. This calculation has not been validated in all clinical situations. eGFR's persistently <60 mL/min signify possible Chronic Kidney Disease.    Anion gap 9 5 - 15    Comment: Performed at [facility], [place] [address] [place], [place] [address]  CBC     Status: Abnormal   Collection Time: 09/22/16  6:56 PM  Result Value Ref Range   WBC 12.9 (H) 4.0 - 10.5 K/uL   RBC 5.13 (H) 3.87 - 5.11 MIL/uL   Hemoglobin 15.5 (H) 12.0 - 15.0 g/dL   HCT 44.7 36.0 - 46.0 %   MCV 87.1 78.0 - 100.0 fL   MCH 30.2 26.0 - 34.0 pg   MCHC 34.7 30.0 - 36.0 g/dL   RDW 13.5 11.5 - 15.5 %   Platelets 205 150 - 400 K/uL    Comment: Performed at [facility], [place] [address] [place], [place] [address]  Ethanol     Status: None   Collection Time: 09/22/16  6:56 PM  Result Value Ref Range   Alcohol, Ethyl (B) <5 <5 mg/dL    Comment:        LOWEST DETECTABLE LIMIT FOR SERUM ALCOHOL IS 5 mg/dL FOR MEDICAL PURPOSES ONLY Performed at [facility], [place] [address] [place], [place] [address]     Blood Alcohol level:  Lab Results  Component Value Date   [facility] <5 09/22/2016   ETH <5 45/03/8880    Metabolic Disorder Labs: Lab Results  Component Value Date   HGBA1C 5.3 09/19/2016   MPG 105.41 09/19/2016   Lab Results  Component Value Date   PROLACTIN 19.1 09/19/2016   Lab Results  Component Value Date   CHOL 197 09/19/2016   TRIG 95 09/19/2016   HDL 69 09/19/2016   CHOLHDL 2.9 09/19/2016   VLDL 19 09/19/2016   LDLCALC 109 (H) 09/19/2016    Physical Findings: AIMS: Facial and Oral Movements Muscles of Facial Expression: None, normal Lips and Perioral Area: None, normal Jaw: None, normal Tongue: None, normal,Extremity Movements Upper (arms, wrists, hands, fingers): None, normal Lower (legs, knees, ankles, toes): None, normal, Trunk Movements Neck, shoulders, hips: None, normal, Overall Severity Severity of abnormal movements  (highest score from questions above): None, normal Incapacitation due to abnormal movements: None, normal Patient's awareness of abnormal movements (rate only patient's report): No Awareness, Dental Status Current problems with teeth and/or dentures?: No Does patient usually wear dentures?: No  CIWA:    COWS:     Musculoskeletal: Strength & Muscle Tone: within normal limits- some restlessness  Gait & Station: normal Patient leans: N/A  Psychiatric Specialty Exam: Physical Exam  ROS no chest pain, no shortness of breath, denies dysuria, no fever, no chills   Blood pressure 105/72, pulse 85, temperature 97.7 F (36.5 C), temperature source Oral, resp. rate 18, height 5' 8"  (1.727 m), weight 68 kg (150 lb).Body mass index is 22.81 kg/m.  General Appearance: Fairly Groomed  Eye Contact:  Good  Speech:  Pressured  Volume:  variable, loud at times   Mood:  states " unhappy" which she attributes to being admitted   Affect:  expanisve, jovial  Thought Process:  Disorganized and Descriptions of Associations: Loose  Orientation:  Other:  fully alert and attentive  Thought Content:  denies hallucinations, no delusions expressed, grandiose ideations - states " my IQ is 160"   Suicidal Thoughts:  No denies suicidal ideations, denies violent or homicidal ideations  Homicidal Thoughts:  No  Memory:  recent and remote fair   Judgement:  Fair  Insight:  Fair  Psychomotor Activity:  some restlessness   Concentration:  Concentration: Fair and Attention Span: Fair  Recall:  [employer] of Knowledge:  Fair  Language:  Fair  Akathisia:  Negative  Handed:  Right  AIMS (if indicated):     Assets:  Desire for Improvement Resilience  ADL's:  Improving   Cognition:  WNL  Sleep:  Number of Hours: 6.25   Assessment - 46 year old female, presents with manic symptoms. Thought process is disorganized , loose, and speech is pressured . She had been recently admitted to our unit but discharged as it was  deemed there were no grounds for involuntary commitment at the time. However, she returned to the hospital within 1-2 days due to worsening symptoms . Nursing staff reports patient seems to respond best to Zyprexa, and she is agreeing to take this medication.    Treatment Plan Summary: Daily contact with patient to assess and evaluate symptoms and progress in treatment, Medication management, Plan inpatient treatment and medications as below Encourage group and milieu participation to work on coping skills and symptom reduction D/C Seroquel  Start Zyprexa 5 mgrs  QAM and 10 mgrs QHS for  mood disorder, mania  Decrease Neurontin to 100 mgrs TID  Start Ativan 1 mgr BID for mood disorder, mania Continue Topamax 50 mgrs BID I have discussed UA findings + elevated WBC with hospitalist- recommendation is to treat- will start Cipro course . Treatment team working on disposition [place], MD 09/24/2016, 10:15 AM

## 2016-09-24 NOTE — Progress Notes (Signed)
Tiffany Romero has been uncomfortable this morning. Writer has observed her pacing back and forth out in the 500 hall. She repeats the same statements over and over and over to anyone who will listen " has anybody  [place]..I  just  Talked to my legal representative after talking to Dr. Clare Charon this morning and he said I should be ready to leave right away this afternoon". A Pt observed getting agitated this am while at the medication window.Marland Kitchenat that time she was saying " when is the doctor going to be here.Marland KitchenMarland KitchenI demand to speak to Dr. Clare Charon...you let me speak to him now...". Pt took her am scheduled meds, along with a prn seroquel 25 mg po . Pt con'td to pace, rant and rave, convinced she was being held against her own will..convinced that she shouldn't have to be here. Writer obtained a now order of zyprexa zydis from Dr. Jama Flavors and this was given to her at 0850 per MD order. Patient took willingly and then noted to be lying in a fetal position on the top of her bed at 0915. R Safety is in place and therapeutic relationship is  Fostered.

## 2016-09-24 NOTE — Progress Notes (Signed)
D: When asked about her day pt stated, "I don't know what's been happening all day. I wanna go home and get some sleep". Informed the writer that she came in voluntarily and requested to sign the 72 hr request for discharge. Pt signed at 80 on Sept 13, 2018. Pt has no questions or concerns.   A:  Support and encouragement was offered. 15 min checks continued for safety.  R: Pt remains safe.

## 2016-09-25 DIAGNOSIS — R450 Nervousness: Secondary | ICD-10-CM

## 2016-09-25 DIAGNOSIS — G4700 Insomnia, unspecified: Secondary | ICD-10-CM

## 2016-09-25 DIAGNOSIS — F419 Anxiety disorder, unspecified: Secondary | ICD-10-CM

## 2016-09-25 MED ORDER — QUETIAPINE FUMARATE 50 MG PO TABS
50.0000 mg | ORAL_TABLET | Freq: Once | ORAL | Status: DC
  Filled 2016-09-25: qty 1

## 2016-09-25 MED ORDER — MIRTAZAPINE 7.5 MG PO TABS
7.5000 mg | ORAL_TABLET | Freq: Every day | ORAL | Status: DC
  Filled 2016-09-25: qty 1

## 2016-09-25 MED ORDER — OLANZAPINE 10 MG PO TBDP
20.0000 mg | ORAL_TABLET | Freq: Every day | ORAL | Status: DC
  Administered 2016-09-25 – 2016-09-27 (×3): 20 mg via ORAL
  Filled 2016-09-25 (×6): qty 2

## 2016-09-25 MED ORDER — DIVALPROEX SODIUM 500 MG PO DR TAB
500.0000 mg | DELAYED_RELEASE_TABLET | Freq: Two times a day (BID) | ORAL | Status: DC
  Administered 2016-09-25 – 2016-09-28 (×6): 500 mg via ORAL
  Filled 2016-09-25 (×12): qty 1

## 2016-09-25 MED ORDER — HYDROXYZINE HCL 50 MG PO TABS
50.0000 mg | ORAL_TABLET | Freq: Once | ORAL | Status: AC
  Administered 2016-09-26: 50 mg via ORAL
  Filled 2016-09-25 (×2): qty 1

## 2016-09-25 MED ORDER — HYDROXYZINE HCL 25 MG PO TABS
25.0000 mg | ORAL_TABLET | Freq: Four times a day (QID) | ORAL | Status: DC | PRN
  Administered 2016-09-25 – 2016-09-27 (×4): 25 mg via ORAL
  Filled 2016-09-25 (×4): qty 1

## 2016-09-25 NOTE — Progress Notes (Signed)
[facility] MD Progress Note  09/25/2016 12:17 PM Tiffany Romero  MRN:  [id number]   Subjective: Tiffany Romero reports " I am feeling better, I know I need to focus on me."  They have checked on my dad and I am doing fine."   Objective: Tiffany Romero is awake, alert and oriented. Patient present with hyper verbal and unable to rest. Denies suicidal or homicidal ideation. Denies auditory or visual hallucination and does not appear to be responding to internal stimuli. Patient present with a list of her feeling and goals for today. Reports she is taking the medication that is given to her without side effects. Reports she is working on her self to help reduce her anxiety. Support, encouragement and reassurance was provided.    Principal Problem: Bipolar disorder ([facility]) Diagnosis:   Patient Active Problem List   Diagnosis Date Noted  . MDD (major depressive disorder), recurrent severe, without psychosis ([facility]) [F33.2] 09/22/2016  . Panic disorder [F41.0] 09/20/2016  . GAD (generalized anxiety disorder) [F41.1] 09/20/2016  . Cannabis use disorder, severe, dependence ([facility]) [F12.20] 09/20/2016  . Opioid use disorder, severe, in sustained remission ([facility]) [F11.21] 09/20/2016  . Major depressive disorder, recurrent episode, severe ([facility]) [F33.2] 09/20/2016  . Bipolar disorder ([facility]) [F31.9] 09/17/2016  . Arthritis of hip Right DDH [M16.10] 01/16/2011   Total Time spent with patient: 25 minutes  Past Psychiatric History:   Past Medical History:  Past Medical History:  Diagnosis Date  . Anxiety    takes lexapro  . Arthritis    djd  . Carpal tunnel syndrome, bilateral   . Chronic back pain   . Headache(784.0)    migraines related to stress  . PONV (postoperative nausea and vomiting)     Past Surgical History:  Procedure Laterality Date  . ABDOMINAL HYSTERECTOMY     2007  . ANTERIOR CRUCIATE LIGAMENT REPAIR    . CARPAL TUNNEL RELEASE  01/24/2012   Procedure: CARPAL TUNNEL RELEASE;  Surgeon: [name], MD;  Location: [facility];  Service: Orthopedics;  Laterality: Left;  . CERVIX SURGERY    . CESAREAN SECTION     1989  . KNEE ARTHROSCOPY    . TOTAL HIP ARTHROPLASTY  01/18/2011   Procedure: TOTAL HIP ARTHROPLASTY;  Surgeon: [name];  Location: [facility];  Service: Orthopedics;  Laterality: Right;  DEPUY/PINNICAL POLY  . TUBAL LIGATION     1996   Family History:  Family History  Problem Relation Age of Onset  . Alcohol abuse Mother   . Depression Mother   . Depression Sister   . Alcohol abuse Sister   . Depression Paternal Grandmother   . Drug abuse Son    Family Psychiatric  History:  Social History:  History  Alcohol Use  . Yes    Comment: occasionally     History  Drug Use  . Types: Marijuana    Social History   Social History  . Marital status: Divorced    Spouse name: N/A  . Number of children: N/A  . Years of education: N/A   Social History Main Topics  . Smoking status: Current Every Day Smoker    Packs/day: 0.50  . Smokeless tobacco: Never Used  . Alcohol use Yes     Comment: occasionally  . Drug use: Yes    Types: Marijuana  . Sexual activity: Not Currently   Other Topics Concern  . None   Social History Narrative  . None   Additional  Social History:    Pain Medications: see MAR Prescriptions: see MAR Over the Counter: see MAR History of alcohol / drug use?: Yes Name of Substance 1: cannabis 1 - Age of First Use: UTA 1 - Amount (size/oz): UTA 1 - Frequency: UTA 1 - Duration: UTA 1 - Last Use / Amount: denies use since discharge from [facility] on 09/21/16 Name of Substance 2: opiates 2 - Age of First Use: UTA 2 - Amount (size/oz): UTA 2 - Frequency: UTA 2 - Duration: UTA 2 - Last Use / Amount: denies use since discharge from [facility] on 09/21/16                Sleep: Poor  Appetite:  Fair  Current Medications: Current Facility-Administered Medications  Medication Dose Route Frequency Provider Last Rate Last Dose   . acetaminophen (TYLENOL) tablet 650 mg  650 mg Oral Q6H PRN Rankin, Shuvon B, NP   650 mg at 09/24/16 0752  . albuterol (PROVENTIL HFA;VENTOLIN HFA) 108 (90 Base) MCG/ACT inhaler 2 puff  2 puff Inhalation Daily PRN Rankin, Shuvon B, NP      . alum & mag hydroxide-simeth (MAALOX/MYLANTA) 200-200-20 MG/5ML suspension 30 mL  30 mL Oral Q4H PRN Rankin, Shuvon B, NP      . gabapentin (NEURONTIN) capsule 100 mg  100 mg Oral TID Cobos, Rockey Situ, MD   100 mg at 09/25/16 1159  . LORazepam (ATIVAN) tablet 1 mg  1 mg Oral BID Cobos, Rockey Situ, MD   1 mg at 09/25/16 0809  . magnesium hydroxide (MILK OF MAGNESIA) suspension 30 mL  30 mL Oral Daily PRN Rankin, Shuvon B, NP      . nicotine (NICODERM CQ - dosed in mg/24 hours) patch 21 mg  21 mg Transdermal Daily Nelly Rout, MD   21 mg at 09/25/16 0807  . nitrofurantoin (macrocrystal-monohydrate) (MACROBID) capsule 100 mg  100 mg Oral Q12H Leatha Gilding, MD   100 mg at 09/25/16 5621  . OLANZapine zydis (ZYPREXA) disintegrating tablet 10 mg  10 mg Oral QHS Cobos, Rockey Situ, MD   10 mg at 09/24/16 2110  . OLANZapine zydis (ZYPREXA) disintegrating tablet 5 mg  5 mg Oral BH-q7a Cobos, Rockey Situ, MD   5 mg at 09/25/16 0607  . QUEtiapine (SEROQUEL) tablet 50 mg  50 mg Oral Once Nira Conn A, NP      . SUMAtriptan (IMITREX) tablet 100 mg  100 mg Oral Daily PRN Rankin, Shuvon B, NP   100 mg at 09/24/16 0324  . topiramate (TOPAMAX) tablet 50 mg  50 mg Oral BID Rankin, Shuvon B, NP   50 mg at 09/25/16 0808    Lab Results:  No results found for this or any previous visit (from the past 48 hour(s)).  Blood Alcohol level:  Lab Results  Component Value Date   ETH <5 09/22/2016   ETH <5 09/17/2016    Metabolic Disorder Labs: Lab Results  Component Value Date   HGBA1C 5.3 09/19/2016   MPG 105.41 09/19/2016   Lab Results  Component Value Date   PROLACTIN 19.1 09/19/2016   Lab Results  Component Value Date   CHOL 197 09/19/2016   TRIG 95  09/19/2016   HDL 69 09/19/2016   CHOLHDL 2.9 09/19/2016   VLDL 19 09/19/2016   LDLCALC 109 (H) 09/19/2016    Physical Findings: AIMS: Facial and Oral Movements Muscles of Facial Expression: None, normal Lips and Perioral Area: None, normal Jaw: None, normal Tongue: None, normal,Extremity  Movements Upper (arms, wrists, hands, fingers): None, normal Lower (legs, knees, ankles, toes): None, normal, Trunk Movements Neck, shoulders, hips: None, normal, Overall Severity Severity of abnormal movements (highest score from questions above): None, normal Incapacitation due to abnormal movements: None, normal Patient's awareness of abnormal movements (rate only patient's report): No Awareness, Dental Status Current problems with teeth and/or dentures?: No Does patient usually wear dentures?: No  CIWA:    COWS:     Musculoskeletal: Strength & Muscle Tone: within normal limits Gait & Station: normal Patient leans: N/A  Psychiatric Specialty Exam: Physical Exam  Nursing note and vitals reviewed. Constitutional: She is oriented to person, place, and time. She appears well-developed.  Cardiovascular: Normal rate.   Neurological: She is oriented to person, place, and time.  Skin: Skin is warm.  Psychiatric: She has a normal mood and affect.    Review of Systems  Psychiatric/Behavioral: The patient is nervous/anxious and has insomnia.   All other systems reviewed and are negative.   Blood pressure 121/87, pulse 91, temperature 98.2 F (36.8 C), temperature source Oral, resp. rate 16, height  (1.727 m), weight 68 kg (150 lb).Body mass index is 22.81 kg/m.  General Appearance: Casual  Eye Contact:  Fair  Speech:  Pressured  Volume:  Normal  Mood:  Anxious  Affect:  Labile  Thought Process:  Linear and Descriptions of Associations: Circumstantial  Orientation:  Full (Time, Place, and Person)  Thought Content:  Rumination  Suicidal Thoughts:  No  Homicidal Thoughts:  No   Memory:  Immediate;   Fair Recent;   Fair Remote;   Fair  Judgement:  Fair  Insight:  Fair  Psychomotor Activity:  Restlessness  Concentration:  Concentration: Fair and Attention Span: Fair  Recall:  [employer] of Knowledge:  Fair  Language:  Fair  Akathisia:  No  Handed:  Right  AIMS (if indicated):     Assets:  Communication Skills Desire for Improvement Resilience Social Support  ADL's:  Intact  Cognition:  WNL  Sleep:  Number of Hours: 4.75     I agree with current treatment plan on 09/25/2016, Patient seen face-to-face for psychiatric evaluation follow-up, chart reviewed and discussed with MD Izediuno see medication adjustment. Reviewed the information documented and agree with the treatment plan.   Treatment Plan Summary: Daily contact with patient to assess and evaluate symptoms and progress in treatment, Medication management and Plan see below   Increase Zyprexa 10 mg 20 mg at QHS and continue Zyprexa  PO AM, Neurontin 100 mg  for mood stabilization Discontinued Seroquel 50 mg patient reports she is unable to tolerated  Initiated Depakote 500 mg PO BID for mood stabilization collect Valproic level on 9/26  Will continue to monitor vitals ,medication compliance and treatment side effects while patient is here. CSW will start working on disposition.  Patient to participate in therapeutic milieu   Oneta Rack, NP 09/25/2016, 12:17 PM

## 2016-09-25 NOTE — Progress Notes (Signed)
Adult Psychoeducational Group Note  Date:  09/25/2016 Time:  9:34 PM  Group Topic/Focus:  Wrap-Up Group:   The focus of this group is to help patients review their daily goal of treatment and discuss progress on daily workbooks.  Participation Level:  Active  Participation Quality:  Attentive  Affect:  Labile  Cognitive:  Alert  Insight: Lacking  Engagement in Group:  Engaged  Modes of Intervention:  Socialization and Support  Additional Comments:  Patient attended and participated in group tonight. She reports that today she went to her meals and attended groups. She tried to get some sleep. She spoke with her Child psychotherapist and the NP.  She it trying to learn how to become better.  Lita Mains [facility] 09/25/2016, 9:34 PM

## 2016-09-25 NOTE — [facility] Group Notes (Addendum)
[facility] Group Notes:  (Nursing/MHT/Case Management/Adjunct)  Date:  09/25/2016  Time:  1:17 PM  Type of Therapy:  Nurse Education   /  The group focuses on teaching patieNts how to identify a healthy coping skill they have used in the past- and how this has helped them .  Participation Level:  Active  Participation Quality:  Attentive  Affect:  Flat  Cognitive:  Delusional  Insight:  Limited  Engagement in Group:  Improving  Modes of Intervention:  Education  Summary of Progress/Problems:  Tiffany Romero 09/25/2016, 1:17 PM

## 2016-09-25 NOTE — [facility] Group Notes (Signed)
[facility] LCSW Group Therapy Note  09/25/2016 at  1:20 to 2 PM  Type of Therapy and Topic:  Group Therapy: Avoiding Self-Sabotaging and Enabling Behaviors  Participation Level:  Active   Description of Group The main focus of today's process group to discuss what "self-sabotage" means and use motivational iInterviewing to discuss what benefits, negative or positive, were involved in a self-identified self-sabotaging behavior. We then talked about reasons the patient may want to change the behavior and their current desire to change.   Summary of Patient Progress: Patient was actively attentive during group discussion and processed some of her negative thoughts and effects of isolation.    Therapeutic molalities: Cognitive Behavioral Therapy Person-Centered Therapy Motivational Interviewing  Therapeutic Goals: 1. Patients will demonstrate understanding of the concept of self sabotage 2. Patients will be able to identify pros and cons of their behaviors 3. Patients will be able to identify at least two motivating factors for l of their desire for change   Carney Bern, LCSW

## 2016-09-25 NOTE — Plan of Care (Signed)
Problem: Coping: Goal: Ability to verbalize frustrations and anger appropriately will improve Outcome: Progressing Patient appropriately verbalized her feelings of increased anger and anxiety. Patient reports that she has "held it together" and is trying to figure out triggers.

## 2016-09-25 NOTE — Progress Notes (Signed)
Data. Patient denies SI/HI/AVH. Verbally contracts for safety on the unit and to come to staff before acting of any self harm thoughts/feelings/voices. Patient has been tearful and has been very verbally pressured, She will talk, very fast,, not stop and listen and she is not easily redirectable.She has been very focused on, "That paper I signed on the 13th. It was a discharge paper and I want to know why I am not being discharged." Patient became very upset and angry when told by the NP that she has been IVC'd. Patient was given her 5pm medications early. Patient was tearful and angry for most of the rest of the afternoon. Patient continues to perseverate on issues of her father's care and her discharge. Action. Emotional support and encouragement offered. Education provided on medication, indications and side effect. Q 15 minute checks done for safety. Response. Safety on the unit maintained through 15 minute checks.  Medications taken as prescribed. Attended groups.

## 2016-09-25 NOTE — Progress Notes (Signed)
D- Patient alert and oriented. Patient is anxious, depressed, and irritable this shift. Patient currently denies SI, HI, AVH, and pain.  Patient has complaints of increased anxiety and anger today.  Patient states "I have held it together in here trying to figure triggers and trying not to snap and it has been really hard. I'm trying to get back to happy.  Patient has requested Remeron multiple times this shift and is wondering why it was discontinued.  Patient has been up multiple times throughout the night with complaints of inability to sleep.  Patient states "I slept like a baby when I was on Remeron". Patient verbalizes grief over the loss of her sister and nephew and strained relationship with her father.     A- Scheduled medications administered to patient, per MD orders. Support and encouragement provided.  Routine safety checks conducted every 15 minutes.  Patient informed to notify staff with problems or concerns. R- No adverse drug reactions noted. Patient contracts for safety at this time. Patient compliant with medications and treatment plan. Patient receptive, calm, and cooperative. Patient interacts well with others on the unit.  Patient remains safe at this time.

## 2016-09-26 NOTE — Plan of Care (Signed)
Problem: Education: Goal: Ability to state activities that reduce stress will improve Outcome: Progressing Nurse discussed depression/anxiety/coping skills with patient.    

## 2016-09-26 NOTE — [facility] Group Notes (Signed)
[facility] LCSW Group Therapy Note  09/26/2016  1:15 to 2 PM   Type of Therapy and Topic: Group Therapy: Feelings Around Returning Home & Establishing a Supportive Framework   Participation Level: Active    Description of Group:  Patients first processed thoughts and feelings about up coming discharge. These included fears of upcoming changes, lack of change, new living environments, judgements and expectations from others and overall stigma of MH issues. We then discussed what is a supportive framework? What does it look like feel like and how do I discern it from and unhealthy non-supportive network? Learn how to cope when supports are not helpful and don't support you. Discuss what to do when your family/friends are not supportive.   Therapeutic Goals Addressed in Processing Group:  1. Patient will identify one healthy supportive network that they can use at discharge. 2. Patient will identify one factor of a supportive framework and how to tell it from an unhealthy network. 3. Patient able to identify one coping skill to use when they do not have positive supports from others. 4. Patient will demonstrate ability to communicate their needs through discussion and/or role plays.  Summary of Patient Progress:  Pt engaged easily during group session. As patients processed their anxiety about discharge and described healthy supports patient shared specifically about self care and setting boundaries. When other had side conversations pt became visibly agitated as evidenced by the holding of her head in hands.    Carney Bern, LCSW

## 2016-09-26 NOTE — Progress Notes (Signed)
D- Patient alert and oriented. Patient is labile this shift. Patient fluctuated between depressed and tearful to hopeful and animated throughout shift.  Patient currently denies SI, HI, and AVH.  Patient reports she "had a really hard time sleeping last night".  Patient continues to request Remeron for sleep and states "Remeron worked excellent and I got really good sleep".  Patient reports that she has a "good handle" on her anger but states that she is getting "worked up" because she does not know a time table for discharge.  Through tears, patient states "Being here [facility]) is driving me batty. I miss my dog. I miss everything about the outside. I feel so small".  Patient verbalizes mild confusion.  Patient observed in the milieu interacting well with others. Patient took night time medications without issue.   A-Support and encouragement provided.  Routine safety checks conducted every 15 minutes.  Patient informed to notify staff with problems or concerns. R- Patient contracts for safety at this time.  Patient remains safe at this time.

## 2016-09-26 NOTE — Progress Notes (Signed)
D:  Patient's self inventory sheet, patient sleeps good, sleep medication helpful.  Good appetite, normal energy level, good concentration.  Denied depression and hopeless,   Rated anxiety #4.   Withdrawals, agitation, irritability.  Denied SI.  Physical problems, headaches, blurred vision.  Physical pain, worst pain #5, head aching, unsure of medication.  Will discuss medications with MD.  Goal is discharge.  Plans to do everything I can in her power to help herself and staff make     That happen.  "I am very appreciative for the entire hospital, especially 500 wing, 400 wing, and 300 wing and inclusive staff.  Does have discharge plan.   A:  Medications administered per MD orders.  Emotional support and encouragement given patient. R:  Denied SI and HI, contracts for safety.  Denied A/V hallucinations.  Safety maintained with 15 minute checks.

## 2016-09-26 NOTE — Progress Notes (Signed)
[facility] MD Progress Note  09/26/2016 8:38 AM Tiffany Romero  MRN:  [id number]   Subjective: Tiffany Romero reports " I slept really well, and I am sorry for acting out on yesterday."   Objective: Tiffany Romero seen talking on the telephone. Patient continues to report anxiety and mild depression. Reports " I hate benign locked behind a closed door."  Patient present with hyperverbal, tangential and pressured speech.  Denies suicidal or homicidal ideation during this assessment. Denies auditory or visual hallucination and does not appear to be responding to internal stimuli.  Patient is tearful throughout this discussion. Patient reports a good appetite and states is resting well on last night.  Support, encouragement and reassurance was provided.    Principal Problem: Bipolar disorder (HCC) Diagnosis:   Patient Active Problem List   Diagnosis Date Noted  . MDD (major depressive disorder), recurrent severe, without psychosis (HCC) [F33.2] 09/22/2016  . Panic disorder [F41.0] 09/20/2016  . GAD (generalized anxiety disorder) [F41.1] 09/20/2016  . Cannabis use disorder, severe, dependence (HCC) [F12.20] 09/20/2016  . Opioid use disorder, severe, in sustained remission (HCC) [F11.21] 09/20/2016  . Major depressive disorder, recurrent episode, severe (HCC) [F33.2] 09/20/2016  . Bipolar disorder (HCC) [F31.9] 09/17/2016  . Arthritis of hip Right DDH [M16.10] 01/16/2011   Total Time spent with patient: 25 minutes  Past Psychiatric History:   Past Medical History:  Past Medical History:  Diagnosis Date  . Anxiety    takes lexapro  . Arthritis    djd  . Carpal tunnel syndrome, bilateral   . Chronic back pain   . Headache(784.0)    migraines related to stress  . PONV (postoperative nausea and vomiting)     Past Surgical History:  Procedure Laterality Date  . ABDOMINAL HYSTERECTOMY     2007  . ANTERIOR CRUCIATE LIGAMENT REPAIR    . CARPAL TUNNEL RELEASE  01/24/2012   Procedure: CARPAL  TUNNEL RELEASE;  Surgeon: [name], MD;  Location: [facility];  Service: Orthopedics;  Laterality: Left;  . CERVIX SURGERY    . CESAREAN SECTION     1989  . KNEE ARTHROSCOPY    . TOTAL HIP ARTHROPLASTY  01/18/2011   Procedure: TOTAL HIP ARTHROPLASTY;  Surgeon: [name];  Location: [facility] OR;  Service: Orthopedics;  Laterality: Right;  DEPUY/PINNICAL POLY  . TUBAL LIGATION     1996   Family History:  Family History  Problem Relation Age of Onset  . Alcohol abuse Mother   . Depression Mother   . Depression Sister   . Alcohol abuse Sister   . Depression Paternal Grandmother   . Drug abuse Son    Family Psychiatric  History:  Social History:  History  Alcohol Use  . Yes    Comment: occasionally     History  Drug Use  . Types: Marijuana    Social History   Social History  . Marital status: Divorced    Spouse name: N/A  . Number of children: N/A  . Years of education: N/A   Social History Main Topics  . Smoking status: Current Every Day Smoker    Packs/day: 0.50  . Smokeless tobacco: Never Used  . Alcohol use Yes     Comment: occasionally  . Drug use: Yes    Types: Marijuana  . Sexual activity: Not Currently   Other Topics Concern  . None   Social History Narrative  . None   Additional Social History:    Pain  Medications: see MAR Prescriptions: see MAR Over the Counter: see MAR History of alcohol / drug use?: Yes Name of Substance 1: cannabis 1 - Age of First Use: UTA 1 - Amount (size/oz): UTA 1 - Frequency: UTA 1 - Duration: UTA 1 - Last Use / Amount: denies use since discharge from [facility] on 09/21/16 Name of Substance 2: opiates 2 - Age of First Use: UTA 2 - Amount (size/oz): UTA 2 - Frequency: UTA 2 - Duration: UTA 2 - Last Use / Amount: denies use since discharge from [facility] on 09/21/16                Sleep: Poor  Appetite:  Fair  Current Medications: Current Facility-Administered Medications  Medication Dose Route  Frequency Provider Last Rate Last Dose  . acetaminophen (TYLENOL) tablet 650 mg  650 mg Oral Q6H PRN Rankin, Shuvon B, NP   650 mg at 09/24/16 0752  . albuterol (PROVENTIL HFA;VENTOLIN HFA) 108 (90 Base) MCG/ACT inhaler 2 puff  2 puff Inhalation Daily PRN Rankin, Shuvon B, NP      . alum & mag hydroxide-simeth (MAALOX/MYLANTA) 200-200-20 MG/5ML suspension 30 mL  30 mL Oral Q4H PRN Rankin, Shuvon B, NP      . divalproex (DEPAKOTE) DR tablet 500 mg  500 mg Oral Q12H Oneta Rack, NP   500 mg at 09/26/16 0811  . gabapentin (NEURONTIN) capsule 100 mg  100 mg Oral TID Cobos, Rockey Situ, MD   100 mg at 09/26/16 0811  . hydrOXYzine (ATARAX/VISTARIL) tablet 25 mg  25 mg Oral Q6H PRN Oneta Rack, NP   25 mg at 09/25/16 2137  . LORazepam (ATIVAN) tablet 1 mg  1 mg Oral BID Cobos, Rockey Situ, MD   1 mg at 09/26/16 0814  . magnesium hydroxide (MILK OF MAGNESIA) suspension 30 mL  30 mL Oral Daily PRN Rankin, Shuvon B, NP      . nicotine (NICODERM CQ - dosed in mg/24 hours) patch 21 mg  21 mg Transdermal Daily Nelly Rout, MD   21 mg at 09/26/16 0810  . nitrofurantoin (macrocrystal-monohydrate) (MACROBID) capsule 100 mg  100 mg Oral Q12H Leatha Gilding, MD   100 mg at 09/26/16 0811  . OLANZapine zydis (ZYPREXA) disintegrating tablet 20 mg  20 mg Oral QHS Oneta Rack, NP   20 mg at 09/25/16 2136  . OLANZapine zydis (ZYPREXA) disintegrating tablet 5 mg  5 mg Oral BH-q7a Cobos, Rockey Situ, MD   5 mg at 09/26/16 4098  . SUMAtriptan (IMITREX) tablet 100 mg  100 mg Oral Daily PRN Rankin, Shuvon B, NP   100 mg at 09/25/16 2138  . topiramate (TOPAMAX) tablet 50 mg  50 mg Oral BID Rankin, Shuvon B, NP   50 mg at 09/26/16 1191    Lab Results:  No results found for this or any previous visit (from the past 48 hour(s)).  Blood Alcohol level:  Lab Results  Component Value Date   [facility] <5 09/22/2016   ETH <5 09/17/2016    Metabolic Disorder Labs: Lab Results  Component Value Date   HGBA1C 5.3  09/19/2016   MPG 105.41 09/19/2016   Lab Results  Component Value Date   PROLACTIN 19.1 09/19/2016   Lab Results  Component Value Date   CHOL 197 09/19/2016   TRIG 95 09/19/2016   HDL 69 09/19/2016   CHOLHDL 2.9 09/19/2016   VLDL 19 09/19/2016   LDLCALC 109 (H) 09/19/2016    Physical Findings: AIMS:  Facial and Oral Movements Muscles of Facial Expression: None, normal Lips and Perioral Area: None, normal Jaw: None, normal Tongue: None, normal,Extremity Movements Upper (arms, wrists, hands, fingers): None, normal Lower (legs, knees, ankles, toes): None, normal, Trunk Movements Neck, shoulders, hips: None, normal, Overall Severity Severity of abnormal movements (highest score from questions above): None, normal Incapacitation due to abnormal movements: None, normal Patient's awareness of abnormal movements (rate only patient's report): No Awareness, Dental Status Current problems with teeth and/or dentures?: No Does patient usually wear dentures?: No  CIWA:    COWS:     Musculoskeletal: Strength & Muscle Tone: within normal limits Gait & Station: normal Patient leans: N/A  Psychiatric Specialty Exam: Physical Exam  Nursing note and vitals reviewed. Constitutional: She is oriented to person, place, and time. She appears well-developed.  Cardiovascular: Normal rate.   Neurological: She is oriented to person, place, and time.  Skin: Skin is warm.  Psychiatric: She has a normal mood and affect.    Review of Systems  Neurological: Positive for headaches.  Psychiatric/Behavioral: The patient is nervous/anxious and has insomnia.   All other systems reviewed and are negative.   Blood pressure 110/83, pulse 92, temperature 97.6 F (36.4 C), temperature source Oral, resp. rate 20, height  (1.727 m), weight 68 kg (150 lb).Body mass index is 22.81 kg/m.  General Appearance: Casual, tearful and anxious   Eye Contact:  Fair  Speech:  Pressured  Volume:  Normal  Mood:   Anxious  Affect:  Labile  Thought Process:  Linear and Descriptions of Associations: Tangential  Orientation:  Full (Time, Place, and Person)  Thought Content:  Rumination  Suicidal Thoughts:  No  Homicidal Thoughts:  No  Memory:  Immediate;   Fair Recent;   Fair Remote;   Fair  Judgement:  Fair  Insight:  Fair  Psychomotor Activity:  Increased  Concentration:  Concentration: Fair and Attention Span: Fair  Recall:  [employer] of Knowledge:  Fair  Language:  Fair  Akathisia:  No  Handed:  Right  AIMS (if indicated):     Assets:  Communication Skills Desire for Improvement Resilience Social Support  ADL's:  Intact  Cognition:  WNL  Sleep:  Number of Hours: 5.75    I agree with current treatment plan on 09/26/2016, Patient seen face-to-face for psychiatric evaluation follow-up, chart reviewed. Reviewed the information documented and agree with the treatment plan.  Treatment Plan Summary: Daily contact with patient to assess and evaluate symptoms and progress in treatment, Medication management and Plan see below   Continue  Zyprexa 20 mg at QHS and continue Zyprexa  PO AM, Neurontin 100 mg  for mood stabilization Discontinued Seroquel 50 mg patient reports she is unable to tolerated  Continue  Depakote 500 mg PO BID for mood stabilization collect Valproic level on 9/26  Will continue to monitor vitals ,medication compliance and treatment side effects while patient is here. CSW will start working on disposition.  Patient to participate in therapeutic milieu   Oneta Rack, NP 09/26/2016, 8:38 AM

## 2016-09-27 DIAGNOSIS — F3163 Bipolar disorder, current episode mixed, severe, without psychotic features: Secondary | ICD-10-CM | POA: Clinically undetermined

## 2016-09-27 DIAGNOSIS — Z79899 Other long term (current) drug therapy: Secondary | ICD-10-CM

## 2016-09-27 MED ORDER — LORAZEPAM 0.5 MG PO TABS
0.5000 mg | ORAL_TABLET | Freq: Every day | ORAL | Status: AC
  Administered 2016-09-28: 0.5 mg via ORAL
  Filled 2016-09-27: qty 1

## 2016-09-27 MED ORDER — LORAZEPAM 0.5 MG PO TABS
0.5000 mg | ORAL_TABLET | Freq: Two times a day (BID) | ORAL | Status: AC
  Administered 2016-09-27: 0.5 mg via ORAL
  Filled 2016-09-27: qty 1

## 2016-09-27 NOTE — Progress Notes (Signed)
DAR Note: Patient appreciative of staff this evening but got angry at a point when she requested to get a copy of all the meds she's gotten so far since her admission with the time she got them and was told that she can get a copy of the list of all her meds and not what she's gotten since her admission. Staff gave patient a copy of the list of all her meds. Patient snatched it away from this writer and went to her room. Meanwhile, she denies SI/HI, AH/VH at this time. Reports headache of 6/10 and rated depression and anxiety 0/10 respectively. Will continue to monitor to monitor patient.

## 2016-09-27 NOTE — Progress Notes (Signed)
Patient ID: [name], female   DOB: Oct 26, 1970, 46 y.o.   MRN: [id number] PER STATE REGULATIONS 482.30  THIS CHART WAS REVIEWED FOR MEDICAL NECESSITY WITH RESPECT TO THE PATIENT'S ADMISSION/ DURATION OF STAY.  NEXT REVIEW DATE: 09/30/2016  Willa Rough, RN, BSN CASE MANAGER

## 2016-09-27 NOTE — [facility] Group Notes (Signed)
LCSW Group Therapy Note   09/27/2016 1:15pm   Type of Therapy and Topic:  Group Therapy:  Overcoming Obstacles   Participation Level:  Active   Description of Group:    In this group patients will be encouraged to explore what they see as obstacles to their own wellness and recovery. They will be guided to discuss their thoughts, feelings, and behaviors related to these obstacles. The group will process together ways to cope with barriers, with attention given to specific choices patients can make. Each patient will be challenged to identify changes they are motivated to make in order to overcome their obstacles. This group will be process-oriented, with patients participating in exploration of their own experiences as well as giving and receiving support and challenge from other group members.   Therapeutic Goals: 1. Patient will identify personal and current obstacles as they relate to admission. 2. Patient will identify barriers that currently interfere with their wellness or overcoming obstacles.  3. Patient will identify feelings, thought process and behaviors related to these barriers. 4. Patient will identify two changes they are willing to make to overcome these obstacles:      Summary of Patient Progress  Tiffany Romero was engaged and stayed the entire time.  She was focused, calm and able to give others good feedback.  This is the best she has looked since I have worked with her.  She identified "not asking others for help" as her main obstacle, and talked about how she plans on approaching both formal and informal supports for help with her father when she gets home.    Therapeutic Modalities:   Cognitive Behavioral Therapy Solution Focused Therapy Motivational Interviewing Relapse Prevention Therapy  Ida Rogue, LCSW 09/27/2016 3:15 PM

## 2016-09-27 NOTE — Progress Notes (Signed)
D:  Patient's self inventory sheet, patient sleeps good, did not receive sleep medication.  Good appetite, normal energy level, good concentration.  Denied depression and hopeless, anxiety 6.  Withdrawals suboxone and THC.  Denied SI.  Denied physical problems, dizziness and headaches.  Physical pain,chest, head, d/t anxiety/panic attack after breakfast.  Pain medication is helpful.  Goal is discharge, learn to reduce anger, anger management classes.  She hates anxiety which she cannot control, breathing, coughing starts.  Does have discharge plans.  Has sleeping problems, anxiety.  Ready to discharge and use coping skills learned. A:  Medications administered per MD orders.  Emotional support and encouragement given patient. R:  Denied SI and HI, contracts for safety.  Denied A/V hallucinations.  Safety maintained with 15 minute checks.

## 2016-09-27 NOTE — Plan of Care (Signed)
Problem: Coping: Goal: Ability to identify and develop effective coping behavior will improve Outcome: Progressing Nurse discussed depression/anxiety/coping skills with patient.    

## 2016-09-27 NOTE — Progress Notes (Signed)
Patient rescinded 72 hour request for discharge 09/27/2016 at 1440.

## 2016-09-27 NOTE — Progress Notes (Signed)
Recreation Therapy Notes  Date: 09/27/16 Time: 1000 Location: 500 Hall Dayroom  Group Topic: Coping Skills  Goal Area(s) Addresses:  Patients will be able to identify the benefits of positive coping skills. Patients will be able to identify the benefits of coping skills post d/c.  Behavioral Response: Engaged  Intervention:  Herbalist, pencils  Activity: Orthoptist.  Patients were given a blank web in which patients were to identify the things they have been struggling with that has kept them stuck.  Patients were to place those things inside the web.  Once those things were identified, patients were to identify at least 3 coping skills for each challenge they identified.  Education: Pharmacologist, Building control surveyor.   Education Outcome: Acknowledges understanding/In group clarification offered/Needs additional education.   Clinical Observations/Feedback: Pt stated she is dealing with self doubt, stress and anger.  Pt stated she deals with these things by believing in herself, breathing and meditating.  Pt also stated she has to realize chaos is "not avoidable".  Pt also expressed she has to work on "unintentional anger" to get back in the present moment.  Pt expressed people sometimes feel "crappy and anxious" when they are dealing with things.  Pt stated she could use her coping skills once she leaves here as a "clean slate" to help her progress.   Caroll Rancher, LRT/CTRS         Lillia Abed, Ahliyah Nienow A 09/27/2016 11:22 AM

## 2016-09-27 NOTE — Progress Notes (Signed)
[facility] MD Progress Note  09/27/2016 1:18 PM Tiffany Romero  MRN:  [id number]   Subjective: Patient states " I am still kind of anxious , but this time we are on the right track. I know I did not tell you the whole story last time. I am on the right medications , but being here , locked up makes me sad.'   Objective:Patient seen and chart reviewed.Discussed patient with treatment team.  Pt seen as less anxious than on admission, she is able to cope better and she states her medications are working. Pt denies any ADRs to the medications. Pt reports she has been trying to work on her coping skills, working on puzzles and coloring and making a list. Pt reports she wants to be discharged atleast tomorrow since she is worried about her pets and her friend who has been taking care of them is leaving town. Pt agrees to attend groups and work on her coping and relaxation techniques .  Per RN - pt seems to be goal focused and is taking medications as offered.      Principal Problem: Bipolar disorder, curr episode mixed, severe, w/o psychotic features (HCC) Diagnosis:   Patient Active Problem List   Diagnosis Date Noted  . Bipolar disorder, curr episode mixed, severe, w/o psychotic features (HCC) [F31.63] 09/27/2016  . Panic disorder [F41.0] 09/20/2016  . GAD (generalized anxiety disorder) [F41.1] 09/20/2016  . Cannabis use disorder, severe, dependence (HCC) [F12.20] 09/20/2016  . Opioid use disorder, severe, in sustained remission (HCC) [F11.21] 09/20/2016  . Arthritis of hip Right DDH [M16.10] 01/16/2011   Total Time spent with patient: 25 minutes  Past Psychiatric History: Please see H&P.   Past Medical History:  Past Medical History:  Diagnosis Date  . Anxiety    takes lexapro  . Arthritis    djd  . Carpal tunnel syndrome, bilateral   . Chronic back pain   . Headache(784.0)    migraines related to stress  . PONV (postoperative nausea and vomiting)     Past Surgical History:   Procedure Laterality Date  . ABDOMINAL HYSTERECTOMY     2007  . ANTERIOR CRUCIATE LIGAMENT REPAIR    . CARPAL TUNNEL RELEASE  01/24/2012   Procedure: CARPAL TUNNEL RELEASE;  Surgeon: [name], MD;  Location: [facility];  Service: Orthopedics;  Laterality: Left;  . CERVIX SURGERY    . CESAREAN SECTION     1989  . KNEE ARTHROSCOPY    . TOTAL HIP ARTHROPLASTY  01/18/2011   Procedure: TOTAL HIP ARTHROPLASTY;  Surgeon: [name];  Location: [facility] OR;  Service: Orthopedics;  Laterality: Right;  DEPUY/PINNICAL POLY  . TUBAL LIGATION     1996   Family History:  Family History  Problem Relation Age of Onset  . Alcohol abuse Mother   . Depression Mother   . Depression Sister   . Alcohol abuse Sister   . Depression Paternal Grandmother   . Drug abuse Son    Family Psychiatric  History: Please see H&P.  Social History: Please see H&P.  History  Alcohol Use  . Yes    Comment: occasionally     History  Drug Use  . Types: Marijuana    Social History   Social History  . Marital status: Divorced    Spouse name: N/A  . Number of children: N/A  . Years of education: N/A   Social History Main Topics  . Smoking status: Current Every Day Smoker  Packs/day: 0.50  . Smokeless tobacco: Never Used  . Alcohol use Yes     Comment: occasionally  . Drug use: Yes    Types: Marijuana  . Sexual activity: Not Currently   Other Topics Concern  . None   Social History Narrative  . None   Additional Social History:    Pain Medications: see MAR Prescriptions: see MAR Over the Counter: see MAR History of alcohol / drug use?: Yes Name of Substance 1: cannabis 1 - Age of First Use: UTA 1 - Amount (size/oz): UTA 1 - Frequency: UTA 1 - Duration: UTA 1 - Last Use / Amount: denies use since discharge from [facility] on 09/21/16 Name of Substance 2: opiates 2 - Age of First Use: UTA 2 - Amount (size/oz): UTA 2 - Frequency: UTA 2 - Duration: UTA 2 - Last Use / Amount:  denies use since discharge from [facility] on 09/21/16                Sleep: improving  Appetite:  Fair  Current Medications: Current Facility-Administered Medications  Medication Dose Route Frequency Provider Last Rate Last Dose  . acetaminophen (TYLENOL) tablet 650 mg  650 mg Oral Q6H PRN Rankin, Shuvon B, NP   650 mg at 09/26/16 2127  . albuterol (PROVENTIL HFA;VENTOLIN HFA) 108 (90 Base) MCG/ACT inhaler 2 puff  2 puff Inhalation Daily PRN Rankin, Shuvon B, NP   2 puff at 09/27/16 1003  . alum & mag hydroxide-simeth (MAALOX/MYLANTA) 200-200-20 MG/5ML suspension 30 mL  30 mL Oral Q4H PRN Rankin, Shuvon B, NP      . divalproex (DEPAKOTE) DR tablet 500 mg  500 mg Oral Q12H Oneta Rack, NP   500 mg at 09/27/16 0807  . gabapentin (NEURONTIN) capsule 100 mg  100 mg Oral TID Cobos, Rockey Situ, MD   100 mg at 09/27/16 1257  . hydrOXYzine (ATARAX/VISTARIL) tablet 25 mg  25 mg Oral Q6H PRN Oneta Rack, NP   25 mg at 09/27/16 0953  . LORazepam (ATIVAN) tablet 0.5 mg  0.5 mg Oral BID Olly Shiner, MD      . magnesium hydroxide (MILK OF MAGNESIA) suspension 30 mL  30 mL Oral Daily PRN Rankin, Shuvon B, NP      . nicotine (NICODERM CQ - dosed in mg/24 hours) patch 21 mg  21 mg Transdermal Daily Nelly Rout, MD   21 mg at 09/27/16 1610  . OLANZapine zydis (ZYPREXA) disintegrating tablet 20 mg  20 mg Oral QHS Oneta Rack, NP   20 mg at 09/26/16 2127  . OLANZapine zydis (ZYPREXA) disintegrating tablet 5 mg  5 mg Oral BH-q7a Cobos, Rockey Situ, MD   5 mg at 09/27/16 9604  . SUMAtriptan (IMITREX) tablet 100 mg  100 mg Oral Daily PRN Rankin, Shuvon B, NP   100 mg at 09/26/16 0908  . topiramate (TOPAMAX) tablet 50 mg  50 mg Oral BID Rankin, Shuvon B, NP   50 mg at 09/27/16 5409    Lab Results:  No results found for this or any previous visit (from the past 48 hour(s)).  Blood Alcohol level:  Lab Results  Component Value Date   [facility] <5 09/22/2016   ETH <5 09/17/2016    Metabolic  Disorder Labs: Lab Results  Component Value Date   HGBA1C 5.3 09/19/2016   MPG 105.41 09/19/2016   Lab Results  Component Value Date   PROLACTIN 19.1 09/19/2016   Lab Results  Component Value Date  CHOL 197 09/19/2016   TRIG 95 09/19/2016   HDL 69 09/19/2016   CHOLHDL 2.9 09/19/2016   VLDL 19 09/19/2016   LDLCALC 109 (H) 09/19/2016    Physical Findings: AIMS: Facial and Oral Movements Muscles of Facial Expression: None, normal Lips and Perioral Area: None, normal Jaw: None, normal Tongue: None, normal,Extremity Movements Upper (arms, wrists, hands, fingers): None, normal Lower (legs, knees, ankles, toes): None, normal, Trunk Movements Neck, shoulders, hips: None, normal, Overall Severity Severity of abnormal movements (highest score from questions above): None, normal Incapacitation due to abnormal movements: None, normal Patient's awareness of abnormal movements (rate only patient's report): No Awareness, Dental Status Current problems with teeth and/or dentures?: No Does patient usually wear dentures?: No  CIWA:  CIWA-Ar Total: 1 COWS:  COWS Total Score: 2  Musculoskeletal: Strength & Muscle Tone: within normal limits Gait & Station: normal Patient leans: N/A  Psychiatric Specialty Exam: Physical Exam  Nursing note and vitals reviewed.   Review of Systems  Psychiatric/Behavioral: The patient is nervous/anxious.   All other systems reviewed and are negative.   Blood pressure 109/72, pulse 86, temperature (!) 97.5 F (36.4 C), temperature source Oral, resp. rate 20, height  (1.727 m), weight 68 kg (150 lb).Body mass index is 22.81 kg/m.  General Appearance: Casual  Eye Contact:  Fair  Speech:  Pressured  Volume:  Normal  Mood:  Anxious and Dysphoric  Affect:  improving , tearful at times  Thought Process:  Linear and Descriptions of Associations: Circumstantial  Orientation:  Full (Time, Place, and Person)  Thought Content:  Rumination  Suicidal  Thoughts:  No  Homicidal Thoughts:  No  Memory:  Immediate;   Fair Recent;   Fair Remote;   Fair  Judgement:  Fair  Insight:  Fair  Psychomotor Activity:  Restlessness  Concentration:  Concentration: Fair and Attention Span: Fair  Recall:  [employer] of Knowledge:  Fair  Language:  Fair  Akathisia:  No  Handed:  Right  AIMS (if indicated):     Assets:  Communication Skills Desire for Improvement Resilience Social Support  ADL's:  Intact  Cognition:  WNL  Sleep:  Number of Hours: 6    Treatment Plan Summary: Pt with Bipolar do , presented with a mixed episode , labile , anxious and panicky , as well as has been abusing cannabis , continues to make progress, will continue treatment.   Daily contact with patient to assess and evaluate symptoms and progress in treatment, Medication management and Plan see below   Continue Depakote DR 500 mg po q12h for mood sx. Depakote level on 09/30/16. Increase Neurontin to 200 mg po tid for anxiety sx. Taper down Ativan to 0.5 mg po bid and then daily and taper off , for anxiety sx. Zyprexa 5 mg po daily and 20 mg po qhs for augmenting the depakote for mood sx, anxiety. Continue Home medications where indicated. CSW will continue to work on disposition.    Daden Mahany, MD 09/27/2016, 1:18 PM

## 2016-09-27 NOTE — Progress Notes (Signed)
Patient talked to SW about upcoming court date which she is concerned about. Patient tearful over her dad's physical health and that she needs to take care of him.

## 2016-09-27 NOTE — Progress Notes (Signed)
Nursing Progress Note: 7p-7a D: Pt currently presents with a anxious/depressed/pleasant on approach affect and behavior. Pt states "I want to go home. I shouldn't have signed myself in a second time. I guess it was helpful at the end of he day." Interacting appropriately with the milieu. Pt reports fair sleep during the previous night with current medication regimen.   A: Pt provided with medications per providers orders. Pt's labs and vitals were monitored throughout the night. Pt supported emotionally and encouraged to express concerns and questions. Pt educated on medications.  R: Pt's safety ensured with 15 minute and environmental checks. Pt currently denies SI, HI, and AVH. Pt verbally contracts to seek staff if SI,HI, or AVH occurs and to consult with staff before acting on any harmful thoughts. Will continue to monitor.

## 2016-09-28 MED ORDER — HYDROXYZINE HCL 25 MG PO TABS: 25.0000 mg | ORAL_TABLET | Freq: Four times a day (QID) | ORAL | 0 refills | Status: DC | PRN

## 2016-09-28 MED ORDER — DIVALPROEX SODIUM 500 MG PO DR TAB: 500.0000 mg | DELAYED_RELEASE_TABLET | Freq: Two times a day (BID) | ORAL | 0 refills | Status: DC

## 2016-09-28 MED ORDER — NICOTINE 21 MG/24HR TD PT24: 21.0000 mg | MEDICATED_PATCH | Freq: Every day | TRANSDERMAL | 0 refills | Status: DC

## 2016-09-28 MED ORDER — OLANZAPINE 20 MG PO TBDP: 20.0000 mg | ORAL_TABLET | Freq: Every day | ORAL | 0 refills | Status: DC

## 2016-09-28 MED ORDER — OLANZAPINE 5 MG PO TBDP: 5.0000 mg | ORAL_TABLET | ORAL | 0 refills | Status: DC

## 2016-09-28 MED ORDER — GABAPENTIN 100 MG PO CAPS: 100.0000 mg | ORAL_CAPSULE | Freq: Three times a day (TID) | ORAL | 0 refills | Status: DC

## 2016-09-28 NOTE — BHH Suicide Risk Assessment (Signed)
[facility] Discharge Suicide Risk Assessment   Principal Problem: Bipolar disorder, curr episode mixed, severe, w/o psychotic features [facility]) Discharge Diagnoses:  Patient Active Problem List   Diagnosis Date Noted  . Bipolar disorder, curr episode mixed, severe, w/o psychotic features (HCC) [F31.63] 09/27/2016  . Panic disorder [F41.0] 09/20/2016  . GAD (generalized anxiety disorder) [F41.1] 09/20/2016  . Cannabis use disorder, severe, dependence (HCC) [F12.20] 09/20/2016  . Opioid use disorder, severe, in sustained remission (HCC) [F11.21] 09/20/2016  . Arthritis of hip Right DDH [M16.10] 01/16/2011    Total Time spent with patient: 30 minutes  Musculoskeletal: Strength & Muscle Tone: within normal limits Gait & Station: normal Patient leans: N/A  Psychiatric Specialty Exam: Review of Systems  Psychiatric/Behavioral: Positive for substance abuse. Negative for depression, hallucinations and suicidal ideas.  All other systems reviewed and are negative.   Blood pressure 105/77, pulse 91, temperature 97.7 F (36.5 C), temperature source Oral, resp. rate 18, height  (1.727 m), weight 68 kg (150 lb).Body mass index is 22.81 kg/m.  General Appearance: Casual  Eye Contact::  Fair  Speech:  Clear and Coherent409  Volume:  Normal  Mood:  Euthymic  Affect:  Appropriate  Thought Process:  Goal Directed and Descriptions of Associations: Intact  Orientation:  Full (Time, Place, and Person)  Thought Content:  Logical  Suicidal Thoughts:  No  Homicidal Thoughts:  No  Memory:  Immediate;   Fair Recent;   Fair Remote;   Fair  Judgement:  Fair  Insight:  Fair  Psychomotor Activity:  Normal  Concentration:  Fair  Recall:  [employer] of Knowledge:Fair  Language: Fair  Akathisia:  No  Handed:  Right  AIMS (if indicated):   0  Assets:  Communication Skills Desire for Improvement  Sleep:  Number of Hours: 5.25  Cognition: WNL  ADL's:  Intact   Mental Status Per Nursing Assessment::    On Admission:  Suicidal ideation indicated by patient  Demographic Factors:  Caucasian  Loss Factors: NA  Historical Factors: Impulsivity  Risk Reduction Factors:   Positive social support and Positive therapeutic relationship  Continued Clinical Symptoms:  Alcohol/Substance Abuse/Dependencies Previous Psychiatric Diagnoses and Treatments  Cognitive Features That Contribute To Risk:  None    Suicide Risk:  Minimal: No identifiable suicidal ideation.  Patients presenting with no risk factors but with morbid ruminations; may be classified as minimal risk based on the severity of the depressive symptoms  Follow-up Information    [facility] Follow up on 10/08/2016.   Specialty:  Behavioral Health Why:  Friday at 9:00 with Dr Gilmore Laroche.  Then October 1, Monday, at 10:00 with Towanda Malkin, therapist. Contact information: [address] [place] [address] [phone]       [facility] Follow up.   Why:  patient need Valproic level on 09/30/2016 patient to f/u with [facility] or primary care provider Contact information: [address] [place] [address] [phone]          Plan Of Care/Follow-up recommendations:  Activity:  no restrictions Diet:  regular Tests:  Depakote level early thursday AM - 09/30/16  Other:  none  Jasman Pfeifle, MD 09/28/2016, 10:22 AM

## 2016-09-28 NOTE — Discharge Summary (Signed)
Physician Discharge Summary Note  Patient:  Tiffany Romero is an 46 y.o., female MRN:  [id number] DOB:  1970/12/26 Patient phone:  [phone] (home)  Patient address:   [address] [place] [address],  Total Time spent with patient: 30 minutes  Date of Admission:  09/22/2016 Date of Discharge: 09/28/2016  Reason for Admission:Per HPI-46 y.o Caucasian female. History of Bipolar Disorder. Discharged from our unit on 09/21/16. Represented the following day in a panic mode. Was supposed to start IOP at the [facility]. Was described as being manic there. Reported to be very irritable, labile and restless. Reported to be hyperactive and not able to sleep. No significant changes in her laboratory parameters since discharge.  At interview, patient is pressured and thought disordered. She has journals which depicts her thought process. Marked flight of ideas. Says she had a panic attack yesterday because it was the anniversary of family members that passed.  Says she is not suicidal. She does not have any thoughts of violence. Says she does not have any hallucinations. Patient says the panic has resolved and she hopes to leave soon.   Principal Problem: Bipolar disorder, curr episode mixed, severe, w/o psychotic features [facility] Discharge Diagnoses: Patient Active Problem List   Diagnosis Date Noted  . Bipolar disorder, curr episode mixed, severe, w/o psychotic features (HCC) [F31.63] 09/27/2016  . Panic disorder [F41.0] 09/20/2016  . GAD (generalized anxiety disorder) [F41.1] 09/20/2016  . Cannabis use disorder, severe, dependence (HCC) [F12.20] 09/20/2016  . Opioid use disorder, severe, in sustained remission (HCC) [F11.21] 09/20/2016  . Arthritis of hip Right DDH [M16.10] 01/16/2011    Past Psychiatric History:   Past Medical History:  Past Medical History:  Diagnosis Date  . Anxiety    takes lexapro  . Arthritis    djd  . Carpal tunnel syndrome, bilateral   . Chronic  back pain   . Headache(784.0)    migraines related to stress  . PONV (postoperative nausea and vomiting)     Past Surgical History:  Procedure Laterality Date  . ABDOMINAL HYSTERECTOMY     2007  . ANTERIOR CRUCIATE LIGAMENT REPAIR    . CARPAL TUNNEL RELEASE  01/24/2012   Procedure: CARPAL TUNNEL RELEASE;  Surgeon: [name], MD;  Location: [facility];  Service: Orthopedics;  Laterality: Left;  . CERVIX SURGERY    . CESAREAN SECTION     1989  . KNEE ARTHROSCOPY    . TOTAL HIP ARTHROPLASTY  01/18/2011   Procedure: TOTAL HIP ARTHROPLASTY;  Surgeon: [name];  Location: [facility];  Service: Orthopedics;  Laterality: Right;  DEPUY/PINNICAL POLY  . TUBAL LIGATION     1996   Family History:  Family History  Problem Relation Age of Onset  . Alcohol abuse Mother   . Depression Mother   . Depression Sister   . Alcohol abuse Sister   . Depression Paternal Grandmother   . Drug abuse Son    Family Psychiatric  History: Social History:  History  Alcohol Use  . Yes    Comment: occasionally     History  Drug Use  . Types: Marijuana    Social History   Social History  . Marital status: Divorced    Spouse name: N/A  . Number of children: N/A  . Years of education: N/A   Social History Main Topics  . Smoking status: Current Every Day Smoker    Packs/day: 0.50  . Smokeless tobacco: Never Used  .  Alcohol use Yes     Comment: occasionally  . Drug use: Yes    Types: Marijuana  . Sexual activity: Not Currently   Other Topics Concern  . None   Social History Narrative  . None    Hospital Course:  Tiffany Romero was admitted for Bipolar disorder, curr episode mixed, severe, w/o psychotic features (HCC)  and crisis management.  Pt was treated discharged with the medications listed below under Medication List.  Medical problems were identified and treated as needed.  Home medications were restarted as appropriate.  Improvement was monitored by observation  and Tiffany Romero 's daily report of symptom reduction.  Emotional and mental status was monitored by daily self-inventory reports completed by Tiffany Romero and clinical staff.         Tiffany Romero was evaluated by the treatment team for stability and plans for continued recovery upon discharge. Tiffany Romero 's motivation was an integral factor for scheduling further treatment. Employment, transportation, bed availability, health status, family support, and any pending legal issues were also considered during hospital stay. Pt was offered further treatment options upon discharge including but not limited to Residential, Intensive Outpatient, and Outpatient treatment.  Tiffany Romero will follow up with the services as listed below under Follow Up Information.     Upon completion of this admission the patient was both mentally and medically stable for discharge denying suicidal/homicidal ideation, auditory/visual/tactile hallucinations, delusional thoughts and paranoia.    Tiffany Romero responded well to treatment with Neurontin , Depakote   and  Zywithout adverse effects. Pt demonstrated improvement without reported or observed adverse effects to the point of stability appropriate for outpatient management. Pertinent labs include:  Depakote level due 9/20 for which outpatient follow-up is necessary for lab recheck as mentioned below. Reviewed CBC, CMP, BAL, and UDS; all unremarkable aside from noted exceptions.    Physical Findings: AIMS: Facial and Oral Movements Muscles of Facial Expression: None, normal Lips and Perioral Area: None, normal Jaw: None, normal Tongue: None, normal,Extremity Movements Upper (arms, wrists, hands, fingers): None, normal Lower (legs, knees, ankles, toes): None, normal, Trunk Movements Neck, shoulders, hips: None, normal, Overall Severity Severity of abnormal movements (highest score from questions above): None, normal Incapacitation  due to abnormal movements: None, normal Patient's awareness of abnormal movements (rate only patient's report): No Awareness, Dental Status Current problems with teeth and/or dentures?: No Does patient usually wear dentures?: No  CIWA:  CIWA-Ar Total: 1 COWS:  COWS Total Score: 2  Musculoskeletal: Strength & Muscle Tone: within normal limits Gait & Station: normal Patient leans: N/A  Psychiatric Specialty Exam: See SRA by MD Physical Exam  Vitals reviewed. Constitutional: She is oriented to person, place, and time. She appears well-developed.  Neurological: She is alert and oriented to person, place, and time.  Psychiatric: She has a normal mood and affect. Her behavior is normal.    Review of Systems  Psychiatric/Behavioral: Negative for depression (stable). The patient is not nervous/anxious.     Blood pressure 105/77, pulse 91, temperature 97.7 F (36.5 C), temperature source Oral, resp. rate 18, height  (1.727 m), weight 68 kg (150 lb).Body mass index is 22.81 kg/m.   Have you used any form of tobacco in the last 30 days? (Cigarettes, Smokeless Tobacco, Cigars, and/or Pipes): Yes  Has this patient used any form of tobacco in the last 30 days? (Cigarettes, Smokeless Tobacco, Cigars, and/or Pipes)  No  Blood Alcohol level:  Lab Results  Component Value Date   [facility] <5 09/22/2016   ETH <5 09/17/2016    Metabolic Disorder Labs:  Lab Results  Component Value Date   HGBA1C 5.3 09/19/2016   MPG 105.41 09/19/2016   Lab Results  Component Value Date   PROLACTIN 19.1 09/19/2016   Lab Results  Component Value Date   CHOL 197 09/19/2016   TRIG 95 09/19/2016   HDL 69 09/19/2016   CHOLHDL 2.9 09/19/2016   VLDL 19 09/19/2016   LDLCALC 109 (H) 09/19/2016    See Psychiatric Specialty Exam and Suicide Risk Assessment completed by Attending Physician prior to discharge.  Discharge destination:  Home  Is patient on multiple antipsychotic therapies at discharge:  No    Has Patient had three or more failed trials of antipsychotic monotherapy by history:  No  Recommended Plan for Multiple Antipsychotic Therapies: NA  Discharge Instructions    Diet - low sodium heart healthy    Complete by:  As directed    Discharge instructions    Complete by:  As directed    Take all medications as prescribed. Keep all follow-up appointments as scheduled.  Do not consume alcohol or use illegal drugs while on prescription medications. Report any adverse effects from your medications to your primary care provider promptly.  In the event of recurrent symptoms or worsening symptoms, call 911, a crisis hotline, or go to the nearest emergency department for evaluation.   Increase activity slowly    Complete by:  As directed      Allergies as of 09/28/2016      Reactions   Cephalexin Hives   Coumadin [warfarin Sodium] Swelling   Trazodone And Nefazodone Other (See Comments)   migraine      Medication List    STOP taking these medications   mirtazapine 7.5 MG tablet Commonly known as:  REMERON   QUEtiapine 50 MG tablet Commonly known as:  SEROQUEL   venlafaxine XR 37.5 MG 24 hr capsule Commonly known as:  EFFEXOR-XR     TAKE these medications     Indication  divalproex 500 MG DR tablet Commonly known as:  DEPAKOTE Take 1 tablet (500 mg total) by mouth every 12 (twelve) hours.  Indication:  mood stabl   gabapentin 100 MG capsule Commonly known as:  NEURONTIN Take 1 capsule (100 mg total) by mouth 3 (three) times daily. What changed:  how much to take  additional instructions  Indication:  Cocaine Dependence   hydrOXYzine 25 MG tablet Commonly known as:  ATARAX/VISTARIL Take 1 tablet (25 mg total) by mouth every 6 (six) hours as needed for itching or anxiety. What changed:  when to take this  reasons to take this  Indication:  Feeling Anxious   nicotine 21 mg/24hr patch Commonly known as:  NICODERM CQ - dosed in mg/24 hours Place 1 patch  (21 mg total) onto the skin daily.  Indication:  Nicotine Addiction   OLANZapine zydis 20 MG disintegrating tablet Commonly known as:  ZYPREXA Take 1 tablet (20 mg total) by mouth at bedtime.  Indication:  Major Depressive Disorder   OLANZapine zydis 5 MG disintegrating tablet Commonly known as:  ZYPREXA Take 1 tablet (5 mg total) by mouth every morning.  Indication:  Major Depressive Disorder   PROAIR HFA 108 (90 Base) MCG/ACT inhaler Generic drug:  albuterol Inhale 2 puffs into the lungs daily as needed for shortness of breath.  Indication:  Asthma   SUMAtriptan 100 MG tablet Commonly known  as:  IMITREX Take 1 tablet (100 mg total) by mouth daily as needed for migraine or headache. May repeat in 2 hours if headache persists or recurs.  Indication:  Headache, Migraine Headache   topiramate 50 MG tablet Commonly known as:  TOPAMAX Take 1 tablet (50 mg total) by mouth 2 (two) times daily. For headaches  Indication:  Migraine Headache      Follow-up Information    [facility] Follow up on 10/08/2016.   Specialty:  Behavioral Health Why:  Friday at 9:00 with Dr Gilmore Laroche.  Then October 1, Monday, at 10:00 with Towanda Malkin, therapist. Contact information: [address] [place] [address] [phone]       [facility] Follow up.   Why:  patient need Valproic level on 09/30/2016 patient to f/u with [facility] or primary care provider Contact information: [address] [place] [address] [phone]          Follow-up recommendations:  Activity:  as tolerated  Diet:  heart healthy Tests:  valproic level due 09/30/2016  Comments:  Take all medications as prescribed. Keep all follow-up appointments as scheduled.  Do not consume alcohol or use illegal drugs while on prescription medications. Report any adverse effects from your medications  to your primary care provider promptly.  In the event of recurrent symptoms or worsening symptoms, call 911, a crisis hotline, or go to the nearest emergency department for evaluation.   Signed: Oneta Rack, NP 09/28/2016, 9:05 AM

## 2016-09-28 NOTE — Progress Notes (Signed)
Recreation Therapy Notes  Date: 09/28/16 Time: 1000 Location: [address] Dayroom  Group Topic: Anger Management  Goal Area(s) Addresses:  Patient will identify triggers for anger.  Patient will identify physical reaction to anger.   Patient will identify benefit of using coping skills when angry.  Behavioral Response: Engaged  Intervention: Worksheet, pencils  Activity: Anger Thermometer.  Patients were given Romero blank worksheet of Romero thermometer.  Patients were to rank 5 situations in which they became angry from 1-10 (1 being the lowest, 10 being the highest) on the thermometer.  Patients were to come up with at least one coping skill for each situation they identified.    Education: Anger Management, Discharge Planning   Education Outcome: Acknowledges education/In group clarification offered/Needs additional education.   Clinical Observations/Feedback: Pt stated when she gets angry she gets anxious, blood pressure rises, turns red in the face and says things she doesn't mean.  Some things pt identified that gets her angry are "too much to handle- delegate/say not; too many people wanting me to do things- set boundaries/say no; dad thinks I can't take care of him- remain calm, explain things in Romero different way; and anxiety- controlled breathing, pluck at hair tie and punch pillow".  Pt expressed if you use negative coping skills, you could "end up here, go to jail, and end up in the emergency room".   Tiffany Romero, LRT/CTRS          Tiffany Romero, Tiffany Romero 09/28/2016 11:29 AM

## 2016-09-28 NOTE — Progress Notes (Signed)
Pt discharged home in her own car. Pt was ambulatory, stable and appreciative at that time. All papers and prescriptions were given and valuables returned. Verbal understanding expressed. Denies SI/HI and A/VH. Pt given opportunity to express concerns and ask questions.  

## 2016-09-29 NOTE — Progress Notes (Signed)
  [facility] Adult Case Management Discharge Plan :  Will you be returning to the same living situation after discharge:  Yes,  home At discharge, do you have transportation home?: Yes,  vehicle is here Do you have the ability to pay for your medications: Yes,  insurance  Release of information consent forms completed and in the chart;  Patient's signature needed at discharge.  Patient to Follow up at: Follow-up Information    [facility] Follow up on 10/08/2016.   Specialty:  Behavioral Health Why:  Friday at 9:00 with Dr Gilmore Laroche.  Then October 1, Monday, at 10:00 with Towanda Malkin, therapist. Contact information: [address] [place] [address] [phone]       [facility] Follow up.   Why:  patient need Valproic level on 09/30/2016 patient to f/u with [facility] or primary care provider Contact information: [address] [place] [address] [phone]          Next level of care provider has access to [facility] Link:yes  Safety Planning and Suicide Prevention discussed: Yes,  yes  Have you used any form of tobacco in the last 30 days? (Cigarettes, Smokeless Tobacco, Cigars, and/or Pipes): Yes  Has patient been referred to the Quitline?: Patient refused referral  Patient has been referred for addiction treatment: N/A  Ida Rogue, LCSW 09/29/2016, 4:18 PM

## 2016-10-06 ENCOUNTER — Ambulatory Visit (HOSPITAL_COMMUNITY): Payer: Self-pay | Admitting: Licensed Clinical Social Worker

## 2016-10-08 ENCOUNTER — Ambulatory Visit (INDEPENDENT_AMBULATORY_CARE_PROVIDER_SITE_OTHER): Payer: Medicare Other | Admitting: Psychiatry

## 2016-10-08 ENCOUNTER — Encounter (HOSPITAL_COMMUNITY): Payer: Self-pay | Admitting: Psychiatry

## 2016-10-08 VITALS — BP 124/72 | HR 86 | Resp 16 | Ht 69.0 in | Wt 166.0 lb

## 2016-10-08 DIAGNOSIS — F3160 Bipolar disorder, current episode mixed, unspecified: Secondary | ICD-10-CM

## 2016-10-08 DIAGNOSIS — Z811 Family history of alcohol abuse and dependence: Secondary | ICD-10-CM

## 2016-10-08 DIAGNOSIS — Z813 Family history of other psychoactive substance abuse and dependence: Secondary | ICD-10-CM | POA: Diagnosis not present

## 2016-10-08 DIAGNOSIS — Z818 Family history of other mental and behavioral disorders: Secondary | ICD-10-CM

## 2016-10-08 DIAGNOSIS — F1121 Opioid dependence, in remission: Secondary | ICD-10-CM | POA: Diagnosis not present

## 2016-10-08 DIAGNOSIS — M1990 Unspecified osteoarthritis, unspecified site: Secondary | ICD-10-CM | POA: Diagnosis not present

## 2016-10-08 DIAGNOSIS — F1290 Cannabis use, unspecified, uncomplicated: Secondary | ICD-10-CM

## 2016-10-08 DIAGNOSIS — F411 Generalized anxiety disorder: Secondary | ICD-10-CM | POA: Diagnosis not present

## 2016-10-08 DIAGNOSIS — F0630 Mood disorder due to known physiological condition, unspecified: Secondary | ICD-10-CM

## 2016-10-08 DIAGNOSIS — F17210 Nicotine dependence, cigarettes, uncomplicated: Secondary | ICD-10-CM | POA: Diagnosis not present

## 2016-10-08 MED ORDER — OLANZAPINE 20 MG PO TBDP: 20.0000 mg | ORAL_TABLET | Freq: Every day | ORAL | 0 refills | Status: DC

## 2016-10-08 MED ORDER — OLANZAPINE 5 MG PO TBDP: 5.0000 mg | ORAL_TABLET | ORAL | 0 refills | Status: DC

## 2016-10-08 MED ORDER — DIVALPROEX SODIUM 500 MG PO DR TAB: 500.0000 mg | DELAYED_RELEASE_TABLET | Freq: Two times a day (BID) | ORAL | 0 refills | Status: DC
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
## 2016-10-11 ENCOUNTER — Ambulatory Visit (INDEPENDENT_AMBULATORY_CARE_PROVIDER_SITE_OTHER): Payer: Medicare Other | Admitting: Licensed Clinical Social Worker

## 2016-10-11 DIAGNOSIS — F1290 Cannabis use, unspecified, uncomplicated: Secondary | ICD-10-CM

## 2016-10-11 DIAGNOSIS — F1121 Opioid dependence, in remission: Secondary | ICD-10-CM

## 2016-10-11 DIAGNOSIS — F0630 Mood disorder due to known physiological condition, unspecified: Secondary | ICD-10-CM | POA: Diagnosis not present

## 2016-10-11 DIAGNOSIS — F411 Generalized anxiety disorder: Secondary | ICD-10-CM

## 2016-10-11 DIAGNOSIS — F3160 Bipolar disorder, current episode mixed, unspecified: Secondary | ICD-10-CM | POA: Diagnosis not present
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
## 2016-11-01 ENCOUNTER — Ambulatory Visit (HOSPITAL_COMMUNITY): Payer: Self-pay | Admitting: Psychiatry

## 2016-11-02 ENCOUNTER — Ambulatory Visit (HOSPITAL_COMMUNITY): Payer: Self-pay | Admitting: Licensed Clinical Social Worker

## 2016-11-11 ENCOUNTER — Ambulatory Visit (INDEPENDENT_AMBULATORY_CARE_PROVIDER_SITE_OTHER): Payer: Medicare Other | Admitting: Licensed Clinical Social Worker

## 2016-11-11 DIAGNOSIS — F3160 Bipolar disorder, current episode mixed, unspecified: Secondary | ICD-10-CM

## 2016-11-11 DIAGNOSIS — F1290 Cannabis use, unspecified, uncomplicated: Secondary | ICD-10-CM

## 2016-11-11 DIAGNOSIS — F411 Generalized anxiety disorder: Secondary | ICD-10-CM

## 2016-11-11 DIAGNOSIS — F1121 Opioid dependence, in remission: Secondary | ICD-10-CM

## 2016-11-11 DIAGNOSIS — F0630 Mood disorder due to known physiological condition, unspecified: Secondary | ICD-10-CM | POA: Diagnosis not present
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
## 2016-11-17 ENCOUNTER — Encounter (HOSPITAL_COMMUNITY): Payer: Self-pay | Admitting: Psychiatry

## 2016-11-17 ENCOUNTER — Ambulatory Visit (INDEPENDENT_AMBULATORY_CARE_PROVIDER_SITE_OTHER): Payer: Medicare Other | Admitting: Psychiatry

## 2016-11-17 VITALS — BP 126/78 | HR 87 | Resp 16 | Ht 69.0 in | Wt 174.0 lb

## 2016-11-17 DIAGNOSIS — F3160 Bipolar disorder, current episode mixed, unspecified: Secondary | ICD-10-CM

## 2016-11-17 DIAGNOSIS — F1290 Cannabis use, unspecified, uncomplicated: Secondary | ICD-10-CM

## 2016-11-17 DIAGNOSIS — F0630 Mood disorder due to known physiological condition, unspecified: Secondary | ICD-10-CM

## 2016-11-17 DIAGNOSIS — F1121 Opioid dependence, in remission: Secondary | ICD-10-CM | POA: Diagnosis not present

## 2016-11-17 DIAGNOSIS — F411 Generalized anxiety disorder: Secondary | ICD-10-CM

## 2016-11-17 DIAGNOSIS — F5102 Adjustment insomnia: Secondary | ICD-10-CM

## 2016-11-17 MED ORDER — OLANZAPINE 5 MG PO TBDP: 5.0000 mg | ORAL_TABLET | ORAL | 1 refills | Status: DC

## 2016-11-17 MED ORDER — OLANZAPINE 20 MG PO TBDP: 20.0000 mg | ORAL_TABLET | Freq: Every day | ORAL | 1 refills | Status: DC

## 2016-11-17 MED ORDER — HYDROXYZINE HCL 25 MG PO TABS: 25.0000 mg | ORAL_TABLET | Freq: Every day | ORAL | 1 refills | Status: DC

## 2016-11-17 MED ORDER — DIVALPROEX SODIUM 500 MG PO DR TAB: 500.0000 mg | DELAYED_RELEASE_TABLET | Freq: Two times a day (BID) | ORAL | 1 refills | Status: DC
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
## 2016-11-23 ENCOUNTER — Ambulatory Visit (HOSPITAL_COMMUNITY): Payer: Self-pay | Admitting: Licensed Clinical Social Worker

## 2016-12-06 ENCOUNTER — Ambulatory Visit (HOSPITAL_COMMUNITY): Payer: Self-pay | Admitting: Licensed Clinical Social Worker

## 2016-12-13 ENCOUNTER — Ambulatory Visit (HOSPITAL_COMMUNITY): Payer: Self-pay | Admitting: Licensed Clinical Social Worker

## 2016-12-13 ENCOUNTER — Encounter (HOSPITAL_COMMUNITY): Payer: Self-pay | Admitting: Licensed Clinical Social Worker

## 2016-12-16 ENCOUNTER — Other Ambulatory Visit (HOSPITAL_COMMUNITY): Payer: Self-pay | Admitting: Psychiatry

## 2016-12-29 ENCOUNTER — Ambulatory Visit (HOSPITAL_COMMUNITY): Payer: Self-pay | Admitting: Psychiatry

## 2017-01-03 ENCOUNTER — Other Ambulatory Visit (HOSPITAL_COMMUNITY): Payer: Self-pay | Admitting: Psychiatry

## 2017-07-26 ENCOUNTER — Telehealth (HOSPITAL_COMMUNITY): Payer: Self-pay | Admitting: Psychiatry

## 2017-07-26 NOTE — Telephone Encounter (Signed)
She can be scheduled with me for one hour intake.
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
## 2017-09-03 ENCOUNTER — Emergency Department (HOSPITAL_COMMUNITY): Payer: Medicare Other

## 2017-09-03 ENCOUNTER — Other Ambulatory Visit: Payer: Self-pay

## 2017-09-03 ENCOUNTER — Encounter (HOSPITAL_COMMUNITY): Payer: Self-pay | Admitting: Emergency Medicine

## 2017-09-03 ENCOUNTER — Emergency Department (HOSPITAL_COMMUNITY)
Admission: EM | Admit: 2017-09-03 | Discharge: 2017-09-03 | Disposition: A | Discharge status: Home / Self Care | Payer: Medicare Other | Attending: Emergency Medicine | Admitting: Emergency Medicine

## 2017-09-03 DIAGNOSIS — S9031XA Contusion of right foot, initial encounter: Secondary | ICD-10-CM | POA: Insufficient documentation

## 2017-09-03 DIAGNOSIS — S82492A Other fracture of shaft of left fibula, initial encounter for closed fracture: Secondary | ICD-10-CM | POA: Insufficient documentation

## 2017-09-03 DIAGNOSIS — S8990XA Unspecified injury of unspecified lower leg, initial encounter: Secondary | ICD-10-CM | POA: Diagnosis present

## 2017-09-03 DIAGNOSIS — Y929 Unspecified place or not applicable: Secondary | ICD-10-CM | POA: Diagnosis not present

## 2017-09-03 DIAGNOSIS — R51 Headache: Secondary | ICD-10-CM | POA: Insufficient documentation

## 2017-09-03 DIAGNOSIS — Z23 Encounter for immunization: Secondary | ICD-10-CM | POA: Diagnosis not present

## 2017-09-03 DIAGNOSIS — F17210 Nicotine dependence, cigarettes, uncomplicated: Secondary | ICD-10-CM | POA: Diagnosis not present

## 2017-09-03 DIAGNOSIS — Z79899 Other long term (current) drug therapy: Secondary | ICD-10-CM | POA: Diagnosis not present

## 2017-09-03 DIAGNOSIS — S82435A Nondisplaced oblique fracture of shaft of left fibula, initial encounter for closed fracture: Secondary | ICD-10-CM

## 2017-09-03 DIAGNOSIS — Y939 Activity, unspecified: Secondary | ICD-10-CM | POA: Insufficient documentation

## 2017-09-03 DIAGNOSIS — S9030XA Contusion of unspecified foot, initial encounter: Secondary | ICD-10-CM

## 2017-09-03 DIAGNOSIS — Y999 Unspecified external cause status: Secondary | ICD-10-CM | POA: Diagnosis not present

## 2017-09-03 HISTORY — DX: Bipolar disorder, unspecified: F31.9

## 2017-09-03 MED ORDER — HYDROCODONE-ACETAMINOPHEN 5-325 MG PO TABS
1.0000 | ORAL_TABLET | Freq: Once | ORAL | Status: AC
  Administered 2017-09-03: 1 via ORAL
  Filled 2017-09-03: qty 1

## 2017-09-03 MED ORDER — HYDROCODONE-ACETAMINOPHEN 5-325 MG PO TABS: 1.0000 | ORAL_TABLET | Freq: Four times a day (QID) | ORAL | 0 refills | Status: DC | PRN

## 2017-09-03 MED ORDER — TETANUS-DIPHTH-ACELL PERTUSSIS 5-2.5-18.5 LF-MCG/0.5 IM SUSP
0.5000 mL | Freq: Once | INTRAMUSCULAR | Status: AC
  Administered 2017-09-03: 0.5 mL via INTRAMUSCULAR
  Filled 2017-09-03: qty 0.5

## 2017-09-03 MED ORDER — HYDROCODONE-ACETAMINOPHEN 5-325 MG PO TABS: 1.0000 | ORAL_TABLET | ORAL | 0 refills | Status: DC | PRN

## 2017-09-03 NOTE — Discharge Instructions (Addendum)
Use the walking boot whenever you are up to help with the discomfort.  Remove the boot and apply ice to the sore areas and keep the abrasion clean several times a day.  Elevate the left leg as much as possible.  Use the Ace wrap on your right foot as needed for discomfort.  We sent a prescription to the pharmacy which you requested.  The pharmacy is currently closed so we are dispensing some medicine to use tonight.
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
## 2017-09-03 NOTE — ED Notes (Signed)
Pt able to ambulate with crutches  Steady gait noted  Denies dizziness
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
## 2017-09-05 MED FILL — Hydrocodone-Acetaminophen Tab 5-325 MG: ORAL | Qty: 6 | Status: AC

## 2017-09-06 ENCOUNTER — Ambulatory Visit (INDEPENDENT_AMBULATORY_CARE_PROVIDER_SITE_OTHER): Payer: Medicare Other | Admitting: Psychiatry

## 2017-09-06 ENCOUNTER — Encounter (HOSPITAL_COMMUNITY): Payer: Self-pay | Admitting: Psychiatry

## 2017-09-06 VITALS — BP 110/78 | HR 93 | Ht 70.0 in | Wt 214.0 lb

## 2017-09-06 DIAGNOSIS — F3132 Bipolar disorder, current episode depressed, moderate: Secondary | ICD-10-CM

## 2017-09-06 DIAGNOSIS — F1124 Opioid dependence with opioid-induced mood disorder: Secondary | ICD-10-CM | POA: Diagnosis not present

## 2017-09-06 MED ORDER — ARIPIPRAZOLE 30 MG PO TABS: ORAL_TABLET | ORAL | 0 refills | Status: DC

## 2017-09-06 NOTE — Patient Instructions (Signed)
1. Continue depakote 500 mg twice a day  2. Start Abilify 15 mg daily for one week then 30 mg daily  3. Decrease olanzapine 20 mg at night for one week, then discontinue 4. Obtain blood test (depakote, CMP, CBC) 5. Return to clinic in one month for 30 mins
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
## 2017-10-10 ENCOUNTER — Ambulatory Visit (INDEPENDENT_AMBULATORY_CARE_PROVIDER_SITE_OTHER): Payer: Medicare Other | Admitting: Psychiatry

## 2017-10-10 ENCOUNTER — Encounter (HOSPITAL_COMMUNITY): Payer: Self-pay | Admitting: Psychiatry

## 2017-10-10 VITALS — BP 104/68 | HR 84 | Ht 70.0 in | Wt 211.0 lb

## 2017-10-10 DIAGNOSIS — F3132 Bipolar disorder, current episode depressed, moderate: Secondary | ICD-10-CM

## 2017-10-10 DIAGNOSIS — F419 Anxiety disorder, unspecified: Secondary | ICD-10-CM | POA: Diagnosis not present

## 2017-10-10 DIAGNOSIS — F1124 Opioid dependence with opioid-induced mood disorder: Secondary | ICD-10-CM

## 2017-10-10 DIAGNOSIS — F17210 Nicotine dependence, cigarettes, uncomplicated: Secondary | ICD-10-CM

## 2017-10-10 DIAGNOSIS — G4700 Insomnia, unspecified: Secondary | ICD-10-CM | POA: Diagnosis not present

## 2017-10-10 MED ORDER — ARIPIPRAZOLE 30 MG PO TABS: 30.0000 mg | ORAL_TABLET | Freq: Every day | ORAL | 0 refills | Status: DC

## 2017-10-10 NOTE — Patient Instructions (Signed)
1. Continue Depakote 500 mg twice a day  2. Discontinue Olanzapine  3. Continue Abilify 30 mg daily  4. Obtain blood test (Depakote, CMP, CBC), urine test 5. Referral to CDIOP 6. Return to clinic in one month for 30 mins 7. Referral to therapy

## 2017-10-13 LAB — COMPLETE METABOLIC PANEL WITH GFR
AG RATIO: 2.1 (calc) (ref 1.0–2.5)
ALKALINE PHOSPHATASE (APISO): 76 U/L (ref 33–115)
ALT: 9 U/L (ref 6–29)
AST: 12 U/L (ref 10–35)
Albumin: 4.1 g/dL (ref 3.6–5.1)
BUN: 14 mg/dL (ref 7–25)
CHLORIDE: 106 mmol/L (ref 98–110)
CO2: 22 mmol/L (ref 20–32)
Calcium: 8.8 mg/dL (ref 8.6–10.2)
Creat: 0.77 mg/dL (ref 0.50–1.10)
GFR, EST AFRICAN AMERICAN: 106 mL/min/{1.73_m2} (ref >=60)
GFR, Est Non African American: 91 mL/min/{1.73_m2} (ref >=60)
Globulin: 2 g/dL (calc) (ref 1.9–3.7)
Glucose, Bld: 116 mg/dL (ref 65–139)
POTASSIUM: 3.7 mmol/L (ref 3.5–5.3)
Sodium: 137 mmol/L (ref 135–146)
Total Bilirubin: 0.3 mg/dL (ref 0.2–1.2)
Total Protein: 6.1 g/dL (ref 6.1–8.1)

## 2017-10-13 LAB — DRUGS OF ABUSE SCREEN W/O ALC, ROUTINE URINE
AMPHETAMINES (1000 ng/mL SCRN): NEGATIVE
BARBITURATES: NEGATIVE
BENZODIAZEPINES: NEGATIVE
COCAINE METABOLITES: NEGATIVE
MARIJUANA MET (50 ng/mL SCRN): NEGATIVE
METHADONE: NEGATIVE
METHAQUALONE: NEGATIVE
OPIATES: NEGATIVE
PHENCYCLIDINE: NEGATIVE
PROPOXYPHENE: NEGATIVE

## 2017-10-13 LAB — CBC WITH DIFFERENTIAL/PLATELET
BASOS PCT: 0.4 %
Basophils Absolute: 38 cells/uL (ref 0–200)
EOS ABS: 103 {cells}/uL (ref 15–500)
Eosinophils Relative: 1.1 %
HCT: 40.4 % (ref 35.0–45.0)
HEMOGLOBIN: 13.7 g/dL (ref 11.7–15.5)
Lymphs Abs: 1495 cells/uL (ref 850–3900)
MCH: 29.9 pg (ref 27.0–33.0)
MCHC: 33.9 g/dL (ref 32.0–36.0)
MCV: 88.2 fL (ref 80.0–100.0)
MONOS PCT: 6.1 %
MPV: 10.6 fL (ref 7.5–12.5)
NEUTROS ABS: 7191 {cells}/uL (ref 1500–7800)
Neutrophils Relative %: 76.5 %
PLATELETS: 196 10*3/uL (ref 140–400)
RBC: 4.58 10*6/uL (ref 3.80–5.10)
RDW: 13 % (ref 11.0–15.0)
TOTAL LYMPHOCYTE: 15.9 %
WBC: 9.4 10*3/uL (ref 3.8–10.8)
WBCMIX: 573 {cells}/uL (ref 200–950)

## 2017-10-13 LAB — VALPROIC ACID LEVEL: VALPROIC ACID LVL: 65.8 mg/L (ref 50.0–100.0)

## 2017-10-19 ENCOUNTER — Telehealth (HOSPITAL_COMMUNITY): Payer: Self-pay | Admitting: Psychology

## 2017-10-31 ENCOUNTER — Other Ambulatory Visit: Payer: Self-pay

## 2017-10-31 ENCOUNTER — Emergency Department (HOSPITAL_COMMUNITY): Payer: Medicare Other

## 2017-10-31 ENCOUNTER — Emergency Department (HOSPITAL_COMMUNITY)
Admission: EM | Admit: 2017-10-31 | Discharge: 2017-10-31 | Disposition: A | Discharge status: Home / Self Care | Payer: Medicare Other | Attending: Emergency Medicine | Admitting: Emergency Medicine

## 2017-10-31 ENCOUNTER — Encounter (HOSPITAL_COMMUNITY): Payer: Self-pay | Admitting: *Deleted

## 2017-10-31 DIAGNOSIS — M546 Pain in thoracic spine: Secondary | ICD-10-CM | POA: Insufficient documentation

## 2017-10-31 DIAGNOSIS — F17210 Nicotine dependence, cigarettes, uncomplicated: Secondary | ICD-10-CM | POA: Diagnosis not present

## 2017-10-31 DIAGNOSIS — R52 Pain, unspecified: Secondary | ICD-10-CM

## 2017-10-31 DIAGNOSIS — M79662 Pain in left lower leg: Secondary | ICD-10-CM | POA: Diagnosis not present

## 2017-10-31 DIAGNOSIS — M549 Dorsalgia, unspecified: Secondary | ICD-10-CM

## 2017-10-31 DIAGNOSIS — Z79899 Other long term (current) drug therapy: Secondary | ICD-10-CM | POA: Diagnosis not present

## 2017-10-31 MED ORDER — METHOCARBAMOL 500 MG PO TABS: 500.0000 mg | ORAL_TABLET | Freq: Two times a day (BID) | ORAL | 0 refills | Status: DC

## 2017-10-31 MED ORDER — NAPROXEN 250 MG PO TABS: ORAL_TABLET | ORAL | 0 refills | Status: DC
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
## 2017-10-31 NOTE — ED Notes (Signed)
Pt gave verbal consent to [place] as witnessed by myself for her blood to be drawn with kit provided by [facility].

## 2017-10-31 NOTE — ED Notes (Signed)
Have given pt phone to call ride
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
## 2017-10-31 NOTE — Discharge Instructions (Addendum)
Ice packs to the injured or sore muscles for the next several days then start using heat. Take the medications for pain and muscle spasms. Return to the ED for any problems listed on the head injury sheet. Recheck if you aren't improving in the next week. ° °

## 2017-10-31 NOTE — ED Notes (Signed)
Message left for Tiffany Romero at [phone] which is phone number pt provided.

## 2017-10-31 NOTE — ED Notes (Signed)
Pt has been discharged
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
## 2017-11-10 ENCOUNTER — Ambulatory Visit (HOSPITAL_COMMUNITY): Payer: Medicare Other | Admitting: Psychiatry

## 2017-12-19 ENCOUNTER — Ambulatory Visit (HOSPITAL_COMMUNITY): Payer: Medicare Other | Admitting: Licensed Clinical Social Worker

## 2018-01-16 ENCOUNTER — Telehealth (HOSPITAL_COMMUNITY): Payer: Self-pay | Admitting: Licensed Clinical Social Worker

## 2018-01-16 NOTE — Telephone Encounter (Signed)
Left VM asking pt to call our office if interested in treatment.

## 2018-01-18 ENCOUNTER — Telehealth (HOSPITAL_COMMUNITY): Payer: Self-pay | Admitting: Licensed Clinical Social Worker

## 2018-01-18 NOTE — Telephone Encounter (Signed)
Spoke w/ PT RE referral to CD-IOP from Dr. Vanetta Shawl. Pt declined interest in 3x per week group but expressed interst in a weekly group. Counselor will f/u w/ information RE a weekly Wednesday night group at this office beginning in Feb 2020.
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
## 2021-09-07 ENCOUNTER — Other Ambulatory Visit: Payer: Self-pay | Admitting: Orthopedic Surgery

## 2021-09-10 LAB — COLOGUARD: COLOGUARD: NEGATIVE

## 2021-09-23 NOTE — Patient Instructions (Addendum)
DUE TO COVID-19 ONLY TWO VISITORS  (aged 52 and older)  ARE ALLOWED TO COME WITH YOU AND STAY IN THE WAITING ROOM ONLY DURING PRE OP AND PROCEDURE.   **NO VISITORS ARE ALLOWED IN THE SHORT STAY AREA OR RECOVERY ROOM!!**  IF YOU WILL BE ADMITTED INTO THE HOSPITAL YOU ARE ALLOWED ONLY FOUR SUPPORT PEOPLE DURING VISITATION HOURS ONLY (7 AM -8PM)   The support person(s) must pass our screening, gel in and out, and wear a mask at all times, including in the patient's room. Patients must also wear a mask when staff or their support person are in the room. Visitors GUEST BADGE MUST BE WORN VISIBLY  One adult visitor may remain with you overnight and MUST be in the room by 8 P.M.     Your procedure is scheduled on: 10/05/21   Report to [facility] Main Entrance    Report to admitting at  5:30 AM   Call this number if you have problems the morning of surgery [phone]   Do not eat food :After Midnight.   After Midnight you may have the following liquids until _4:30_____ AM/ DAY OF SURGERY  Water Black Coffee (sugar ok, NO MILK/CREAM OR CREAMERS)  Tea (sugar ok, NO MILK/CREAM OR CREAMERS) regular and decaf                             Plain Jell-O (NO RED)                                           Fruit ices (not with fruit pulp, NO RED)                                     Popsicles (NO RED)                                                                  Juice: apple, WHITE grape, WHITE cranberry Sports drinks like Gatorade (NO RED)                  The day of surgery:  Drink ONE (1) Pre-Surgery Clear Ensure at  4:15 AM the morning of surgery. Drink in one sitting. Do not sip.  This drink was given to you during your hospital  pre-op appointment visit. Nothing else to drink after completing the  Pre-Surgery Clear Ensure 4:30 AM          If you have questions, please contact your surgeon's office.       Oral Hygiene is also important to reduce your risk of infection.                                     Remember - BRUSH YOUR TEETH THE MORNING OF SURGERY WITH YOUR REGULAR TOOTHPASTE   Do NOT smoke after Midnight   Take these medicines the morning of surgery with A SIP OF WATER: Lipitor,   Use your inhalers  and bring them with you                                You may not have any metal on your body including hair pins, jewelry, and body piercing             Do not wear make-up, lotions, powders, perfumes/cologne, or deodorant  Do not wear nail polish including gel and S&S, artificial/acrylic nails, or any other type of covering on natural nails including finger and toenails. If you have artificial nails, gel coating, etc. that needs to be removed by a nail salon please have this removed prior to surgery or surgery may need to be canceled/ delayed if the surgeon/ anesthesia feels like they are unable to be safely monitored.   Do not shave  48 hours prior to surgery.     Do not bring valuables to the hospital. [facility] IS NOT             RESPONSIBLE   FOR VALUABLES.   Contacts, dentures or bridgework may not be worn into surgery.   Bring small overnight bag day of surgery.   DO NOT BRING YOUR HOME MEDICATIONS TO THE HOSPITAL.    Patients discharged on the day of surgery will not be allowed to drive home.  Someone NEEDS to stay with you for the first 24 hours after anesthesia.   Special Instructions: Bring a copy of your healthcare power of attorney and living will documents  the day of surgery if you haven't scanned them before.              Please read over the following fact sheets you were given: IF YOU HAVE QUESTIONS ABOUT YOUR PRE-OP INSTRUCTIONS PLEASE CALL [phone]     [facility] - Preparing for Surgery Before surgery, you can play an important role.  Because skin is not sterile, your skin needs to be as free of germs as possible.  You can reduce the number of germs on your skin by washing with CHG (chlorahexidine gluconate) soap before  surgery.  CHG is an antiseptic cleaner which kills germs and bonds with the skin to continue killing germs even after washing. Please DO NOT use if you have an allergy to CHG or antibacterial soaps.  If your skin becomes reddened/irritated stop using the CHG and inform your nurse when you arrive at Short Stay. Do not shave (including legs and underarms) for at least 48 hours prior to the first CHG shower.   Please follow these instructions carefully:  1.  Shower with CHG Soap the night before surgery and the  morning of Surgery.  2.  If you choose to wash your hair, wash your hair first as usual with your  normal  shampoo.  3.  After you shampoo, rinse your hair and body thoroughly to remove the  shampoo.                            4.  Use CHG as you would any other liquid soap.  You can apply chg directly  to the skin and wash                       Gently with a scrungie or clean washcloth.  5.  Apply the CHG Soap to your body ONLY FROM THE NECK DOWN.   Do  not use on face/ open                           Wound or open sores. Avoid contact with eyes, ears mouth and genitals (private parts).                       Wash face,  Genitals (private parts) with your normal soap.             6.  Wash thoroughly, paying special attention to the area where your surgery  will be performed.  7.  Thoroughly rinse your body with warm water from the neck down.  8.  DO NOT shower/wash with your normal soap after using and rinsing off  the CHG Soap.                9.  Pat yourself dry with a clean towel.            10.  Wear clean pajamas.            11.  Place clean sheets on your bed the night of your first shower and do not  sleep with pets. Day of Surgery : Do not apply any lotions/deodorants the morning of surgery.  Please wear clean clothes to the hospital/surgery center.  FAILURE TO FOLLOW THESE INSTRUCTIONS MAY RESULT IN THE CANCELLATION OF YOUR  SURGERY     ________________________________________________________________________   Incentive Spirometer  An incentive spirometer is a tool that can help keep your lungs clear and active. This tool measures how well you are filling your lungs with each breath. Taking long deep breaths may help reverse or decrease the chance of developing breathing (pulmonary) problems (especially infection) following: A long period of time when you are unable to move or be active. BEFORE THE PROCEDURE  If the spirometer includes an indicator to show your best effort, your nurse or respiratory therapist will set it to a desired goal. If possible, sit up straight or lean slightly forward. Try not to slouch. Hold the incentive spirometer in an upright position. INSTRUCTIONS FOR USE  Sit on the edge of your bed if possible, or sit up as far as you can in bed or on a chair. Hold the incentive spirometer in an upright position. Breathe out normally. Place the mouthpiece in your mouth and seal your lips tightly around it. Breathe in slowly and as deeply as possible, raising the piston or the ball toward the top of the column. Hold your breath for 3-5 seconds or for as long as possible. Allow the piston or ball to fall to the bottom of the column. Remove the mouthpiece from your mouth and breathe out normally. Rest for a few seconds and repeat Steps 1 through 7 at least 10 times every 1-2 hours when you are awake. Take your time and take a few normal breaths between deep breaths. The spirometer may include an indicator to show your best effort. Use the indicator as a goal to work toward during each repetition. After each set of 10 deep breaths, practice coughing to be sure your lungs are clear. If you have an incision (the cut made at the time of surgery), support your incision when coughing by placing a pillow or rolled up towels firmly against it. Once you are able to get out of bed, walk around indoors and  cough well. You may stop using the incentive spirometer when instructed  by your caregiver.  RISKS AND COMPLICATIONS Take your time so you do not get dizzy or light-headed. If you are in pain, you may need to take or ask for pain medication before doing incentive spirometry. It is harder to take a deep breath if you are having pain. AFTER USE Rest and breathe slowly and easily. It can be helpful to keep track of a log of your progress. Your caregiver can provide you with a simple table to help with this. If you are using the spirometer at home, follow these instructions: SEEK MEDICAL CARE IF:  You are having difficultly using the spirometer. You have trouble using the spirometer as often as instructed. Your pain medication is not giving enough relief while using the spirometer. You develop fever of 100.5 F (38.1 C) or higher. SEEK IMMEDIATE MEDICAL CARE IF:  You cough up bloody sputum that had not been present before. You develop fever of 102 F (38.9 C) or greater. You develop worsening pain at or near the incision site. MAKE SURE YOU:  Understand these instructions. Will watch your condition. Will get help right away if you are not doing well or get worse. Document Released: 05/10/2006 Document Revised: 03/22/2011 Document Reviewed: 07/11/2006 [facility] Patient Information 2014 [place], [place].   ________________________________________________________________________

## 2021-09-25 ENCOUNTER — Encounter (HOSPITAL_COMMUNITY)
Admission: RE | Admit: 2021-09-25 | Discharge: 2021-09-25 | Disposition: A | Discharge status: Home / Self Care | Payer: Medicare Other | Source: Ambulatory Visit | Attending: Orthopedic Surgery | Admitting: Orthopedic Surgery

## 2021-09-25 ENCOUNTER — Ambulatory Visit (HOSPITAL_COMMUNITY)
Admission: RE | Admit: 2021-09-25 | Discharge: 2021-09-25 | Disposition: A | Discharge status: Home / Self Care | Payer: Medicare Other | Source: Ambulatory Visit | Attending: Orthopedic Surgery | Admitting: Orthopedic Surgery

## 2021-09-25 ENCOUNTER — Other Ambulatory Visit: Payer: Self-pay

## 2021-09-25 ENCOUNTER — Encounter (HOSPITAL_COMMUNITY): Payer: Self-pay

## 2021-09-25 DIAGNOSIS — Z01818 Encounter for other preprocedural examination: Secondary | ICD-10-CM | POA: Diagnosis present

## 2021-09-25 DIAGNOSIS — F1220 Cannabis dependence, uncomplicated: Secondary | ICD-10-CM

## 2021-09-25 LAB — COMPREHENSIVE METABOLIC PANEL
ALT: 57 U/L — ABNORMAL HIGH (ref 0–44)
AST: 37 U/L (ref 15–41)
Albumin: 4.3 g/dL (ref 3.5–5.0)
Alkaline Phosphatase: 89 U/L (ref 38–126)
Anion gap: 6 (ref 5–15)
BUN: 8 mg/dL (ref 6–20)
CO2: 26 mmol/L (ref 22–32)
Calcium: 9.1 mg/dL (ref 8.9–10.3)
Chloride: 108 mmol/L (ref 98–111)
Creatinine, Ser: 0.71 mg/dL (ref 0.44–1.00)
GFR, Estimated: >60 mL/min (ref >60)
Glucose, Bld: 81 mg/dL (ref 70–99)
Potassium: 3.6 mmol/L (ref 3.5–5.1)
Sodium: 140 mmol/L (ref 135–145)
Total Bilirubin: 0.7 mg/dL (ref 0.3–1.2)
Total Protein: 7.1 g/dL (ref 6.5–8.1)

## 2021-09-25 LAB — SURGICAL PCR SCREEN
MRSA, PCR: NEGATIVE
Staphylococcus aureus: NEGATIVE

## 2021-09-25 LAB — CBC
HCT: 51.3 % — ABNORMAL HIGH (ref 36.0–46.0)
Hemoglobin: 17.6 g/dL — ABNORMAL HIGH (ref 12.0–15.0)
MCH: 29.7 pg (ref 26.0–34.0)
MCHC: 34.3 g/dL (ref 30.0–36.0)
MCV: 86.5 fL (ref 80.0–100.0)
Platelets: 173 10*3/uL (ref 150–400)
RBC: 5.93 MIL/uL — ABNORMAL HIGH (ref 3.87–5.11)
RDW: 13 % (ref 11.5–15.5)
WBC: 6.1 10*3/uL (ref 4.0–10.5)
nRBC: 0 % (ref 0.0–0.2)

## 2021-09-25 NOTE — Progress Notes (Signed)
Anesthesia note:  Bowel prep reminder:NA  PCP - Dr. Kirtland Bouchard. [place] Cardiologist -none Other- Pulmonologist- Dr. Lily Lovings  Chest x-ray - 09/25/21-epic EKG - 09/25/21-chart Stress Test - no ECHO - no Cardiac Cath - no  Pacemaker/ICD device last checked:NA  Sleep Study - no CPAP -   Pt is pre diabetic-NA Fasting Blood Sugar -  Checks Blood Sugar _____  Blood Thinner:NA Blood Thinner Instructions: Aspirin Instructions: Last Dose:  Anesthesia review: no  Patient denies shortness of breath, fever, cough and chest pain at PAT appointment Pt reports no SOB with activities. She is using a cane. She stopped smoking a week ago and has a cough. She has been off medications for bipolar for about a year.  Patient verbalized understanding of instructions that were given to them at the PAT appointment. Patient was also instructed that they will need to review over the PAT instructions again at home before surgery. yes

## 2021-10-02 DIAGNOSIS — M1612 Unilateral primary osteoarthritis, left hip: Secondary | ICD-10-CM | POA: Diagnosis present

## 2021-10-02 NOTE — H&P (Signed)
TOTAL HIP ADMISSION H&P  Patient is admitted for left total hip arthroplasty.  Subjective:  Chief Complaint: left hip pain  HPI: Tiffany Romero, 51 y.o. female, has a history of pain and functional disability in the left hip(s) due to arthritis and patient has failed non-surgical conservative treatments for greater than 12 weeks to include NSAID's and/or analgesics, flexibility and strengthening excercises, use of assistive devices, weight reduction as appropriate, and activity modification.  Onset of symptoms was gradual starting  several  years ago with gradually worsening course since that time.The patient noted no past surgery on the left hip(s).  Patient currently rates pain in the left hip at 10 out of 10 with activity. Patient has night pain, worsening of pain with activity and weight bearing, trendelenberg gait, pain that interfers with activities of daily living, pain with passive range of motion, and crepitus. Patient has evidence of subchondral cysts and joint space narrowing by imaging studies. This condition presents safety issues increasing the risk of falls.  There is no current active infection.  Patient Active Problem List   Diagnosis Date Noted   Bipolar disorder, curr episode mixed, severe, w/o psychotic features (HCC) 09/27/2016   Panic disorder 09/20/2016   GAD (generalized anxiety disorder) 09/20/2016   Cannabis use disorder, severe, dependence (HCC) 09/20/2016   Opioid use disorder, severe, in sustained remission (HCC) 09/20/2016   Arthritis of hip Right DDH 01/16/2011   Past Medical History:  Diagnosis Date   Anxiety    takes lexapro   Arthritis    djd   Bipolar 1 disorder (HCC)    not on medications at this time   Carpal tunnel syndrome, bilateral    Chronic back pain    Headache(784.0)    history of migraines related to stress   PONV (postoperative nausea and vomiting)     Past Surgical History:  Procedure Laterality Date   ABDOMINAL HYSTERECTOMY  2007    ANTERIOR CRUCIATE LIGAMENT REPAIR     CARPAL TUNNEL RELEASE  01/24/2012   Procedure: CARPAL TUNNEL RELEASE;  Surgeon: [name], MD;  Location: [facility];  Service: Orthopedics;  Laterality: Left;   CERVIX SURGERY  1990   CESAREAN SECTION  1995   1989   FOOT SURGERY Right 2015   KNEE ARTHROSCOPY Right 1991   and [id number]   TOTAL HIP ARTHROPLASTY  01/18/2011   Procedure: TOTAL HIP ARTHROPLASTY;  Surgeon: [name];  Location: [facility] OR;  Service: Orthopedics;  Laterality: Right;  DEPUY/PINNICAL POLY   TUBAL LIGATION  1996    No current facility-administered medications for this encounter.   Current Outpatient Medications  Medication Sig Dispense Refill Last Dose   acetaminophen (TYLENOL) 500 MG tablet Take 1,000 mg by mouth every 8 (eight) hours as needed for moderate pain.      albuterol (PROAIR HFA) 108 (90 Base) MCG/ACT inhaler Inhale 2 puffs into the lungs daily as needed for shortness of breath.      atorvastatin (LIPITOR) 20 MG tablet Take 20 mg by mouth daily.      FLOVENT HFA 110 MCG/ACT inhaler Inhale 2 puffs into the lungs 2 (two) times daily.      ibuprofen (ADVIL) 200 MG tablet Take 1,000 mg by mouth every 8 (eight) hours as needed for moderate pain.      levocetirizine (XYZAL) 5 MG tablet Take 5 mg by mouth daily.      nicotine (NICODERM CQ - DOSED IN MG/24 HOURS) 21 mg/24hr patch Place  21 mg onto the skin daily.      nicotine polacrilex (NICORETTE) 4 MG gum Take 4 mg by mouth as needed for smoking cessation.      ondansetron (ZOFRAN) 4 MG tablet Take 4 mg by mouth every 8 (eight) hours as needed for vomiting or nausea.      traMADol (ULTRAM) 50 MG tablet Take 50 mg by mouth 3 (three) times daily as needed for severe pain.      naproxen (NAPROSYN) 250 MG tablet Take 1 po BID with food prn pain (Patient taking differently: Take 250 mg by mouth 2 (two) times daily as needed for moderate pain.) 20 tablet 0    Allergies  Allergen Reactions   Cephalexin  Hives   Coumadin [Warfarin Sodium] Swelling   Trazodone And Nefazodone Other (See Comments)    migraine    Social History   Tobacco Use   Smoking status: Every Day    Packs/day: 0.25    Years: 40.00    Total pack years: 10.00    Types: Cigarettes, E-cigarettes   Smokeless tobacco: Never   Tobacco comments:    patches, reduce the # of cigarettes   Substance Use Topics   Alcohol use: Yes    Comment: occasionally    Family History  Problem Relation Age of Onset   Alcohol abuse Mother    Depression Mother    Alcohol abuse Father    Depression Sister    Alcohol abuse Sister    Depression Paternal Grandmother    Drug abuse Son    Alcohol abuse Son    Bipolar disorder Son      Review of Systems  Constitutional: Negative.   HENT: Negative.    Eyes: Negative.   Respiratory: Negative.    Gastrointestinal:  Positive for abdominal pain.  Endocrine: Negative.   Musculoskeletal:  Positive for arthralgias and myalgias.  Allergic/Immunologic: Negative.   Neurological: Negative.   Hematological:  Bruises/bleeds easily.  Psychiatric/Behavioral:  Positive for sleep disturbance. The patient is nervous/anxious.     Objective:  Physical Exam Constitutional:      Appearance: Normal appearance. She is normal weight.  HENT:     Head: Normocephalic and atraumatic.     Nose: Nose normal.  Eyes:     Pupils: Pupils are equal, round, and reactive to light.  Cardiovascular:     Pulses: Normal pulses.  Pulmonary:     Effort: Pulmonary effort is normal.  Musculoskeletal:     Cervical back: Normal range of motion and neck supple.     Comments: Surgical scar to the right hip is well-healed internal and extra rotation 40 each foot taps are negative.  The left hip is highly irritable to internal rotation at 5 external rotation causes pain at 40.  She has a 10 forward flexion contracture of her hip.  Skin is intact toes are pink and well perfused neurovascular intact distally.   Neurological:     General: No focal deficit present.     Mental Status: She is alert and oriented to person, place, and time. Mental status is at baseline.  Psychiatric:        Mood and Affect: Mood normal.        Behavior: Behavior normal.        Thought Content: Thought content normal.        Judgment: Judgment normal.     Vital signs in last 24 hours:    Labs:   Estimated body mass index is 36.95  kg/m as calculated from the following:   Height as of 09/25/21: 5\' 8"  (1.727 m).   Weight as of 09/25/21: 110.2 kg.   Imaging Review Plain radiographs demonstrate  AP pelvis and crosstable lateral left hip show end-stage arthritis of the hip is some early collapse of the femoral head superiorly and medially she does have subchondral cysts in the femoral head and peripheral osteophytes including a large drop osteophyte that is increased since her last x-ray in 2016.  On the AP view the contralateral right total hip which was done with a DePuy Pinnacle shell an S-ROM stem is in good position no evidence of loosening.    Assessment/Plan:  End stage arthritis, left hip(s)  The patient history, physical examination, clinical judgement of the provider and imaging studies are consistent with end stage degenerative joint disease of the left hip(s) and total hip arthroplasty is deemed medically necessary. The treatment options including medical management, injection therapy, arthroscopy and arthroplasty were discussed at length. The risks and benefits of total hip arthroplasty were presented and reviewed. The risks due to aseptic loosening, infection, stiffness, dislocation/subluxation,  thromboembolic complications and other imponderables were discussed.  The patient acknowledged the explanation, agreed to proceed with the plan and consent was signed. Patient is being admitted for inpatient treatment for surgery, pain control, PT, OT, prophylactic antibiotics, VTE prophylaxis, progressive  ambulation and ADL's and discharge planning.The patient is planning to be discharged home with home health services    Patient's anticipated LOS is less than 2 midnights, meeting these requirements: - Younger than 63 - Lives within 1 hour of care - Has a competent adult at home to recover with post-op recover - NO history of  - Chronic pain requiring opiods  - Diabetes  - Coronary Artery Disease  - Heart failure  - Heart attack  - Stroke  - DVT/VTE  - Cardiac arrhythmia  - Respiratory Failure/COPD  - Renal failure  - Anemia  - Advanced Liver disease

## 2021-10-03 NOTE — Anesthesia Preprocedure Evaluation (Signed)
Anesthesia Evaluation  Patient identified by MRN, date of birth, ID band Patient awake    Reviewed: Allergy & Precautions, NPO status , Patient's Chart, lab work & pertinent test results  History of Anesthesia Complications (+) PONV and history of anesthetic complications  Airway Mallampati: II  TM Distance: >3 FB Neck ROM: Full    Dental  (+) Dental Advisory Given, Chipped, Partial Upper   Pulmonary Current Smoker and Patient abstained from smoking.,    Pulmonary exam normal        Cardiovascular negative cardio ROS Normal cardiovascular exam     Neuro/Psych  Headaches, PSYCHIATRIC DISORDERS Anxiety Bipolar Disorder  Neuromuscular disease    GI/Hepatic negative GI ROS, (+)     substance abuse  marijuana use,   Endo/Other   Obesity   Renal/GU negative Renal ROS     Musculoskeletal  (+) Arthritis ,   Abdominal   Peds  Hematology negative hematology ROS (+)  Plt 173k    Anesthesia Other Findings   Reproductive/Obstetrics                            Anesthesia Physical Anesthesia Plan  ASA: 2  Anesthesia Plan: Spinal   Post-op Pain Management: Tylenol PO (pre-op)* and Celebrex PO (pre-op)*   Induction:   PONV Risk Score and Plan: 2 and Treatment may vary due to age or medical condition and Propofol infusion  Airway Management Planned: Natural Airway and Simple Face Mask  Additional Equipment: None  Intra-op Plan:   Post-operative Plan:   Informed Consent: I have reviewed the patients History and Physical, chart, labs and discussed the procedure including the risks, benefits and alternatives for the proposed anesthesia with the patient or authorized representative who has indicated his/her understanding and acceptance.       Plan Discussed with: CRNA and Anesthesiologist  Anesthesia Plan Comments: (Labs reviewed, platelets acceptable. Discussed risks and benefits of  spinal, including spinal/epidural hematoma, infection, failed block, and PDPH. Patient expressed understanding and wished to proceed. )       Anesthesia Quick Evaluation

## 2021-10-04 MED ORDER — TRANEXAMIC ACID 1000 MG/10ML IV SOLN
2000.0000 mg | INTRAVENOUS | Status: AC
  Filled 2021-10-04: qty 20

## 2021-10-05 ENCOUNTER — Ambulatory Visit (HOSPITAL_COMMUNITY): Payer: Medicare Other

## 2021-10-05 ENCOUNTER — Other Ambulatory Visit: Payer: Self-pay

## 2021-10-05 ENCOUNTER — Encounter (HOSPITAL_COMMUNITY): Payer: Self-pay | Admitting: Orthopedic Surgery

## 2021-10-05 ENCOUNTER — Ambulatory Visit (HOSPITAL_BASED_OUTPATIENT_CLINIC_OR_DEPARTMENT_OTHER): Payer: Medicare Other | Admitting: Anesthesiology

## 2021-10-05 ENCOUNTER — Encounter (HOSPITAL_COMMUNITY)
Admission: RE | Disposition: A | Discharge status: Home Health | Payer: Self-pay | Source: Home / Self Care | Attending: Orthopedic Surgery

## 2021-10-05 ENCOUNTER — Ambulatory Visit (HOSPITAL_COMMUNITY): Payer: Medicare Other | Admitting: Anesthesiology

## 2021-10-05 ENCOUNTER — Observation Stay (HOSPITAL_COMMUNITY)
Admission: RE | Admit: 2021-10-05 | Discharge: 2021-10-06 | Disposition: A | Discharge status: Home Health | Payer: Medicare Other | Attending: Orthopedic Surgery | Admitting: Orthopedic Surgery

## 2021-10-05 DIAGNOSIS — M1612 Unilateral primary osteoarthritis, left hip: Secondary | ICD-10-CM

## 2021-10-05 DIAGNOSIS — Z96641 Presence of right artificial hip joint: Secondary | ICD-10-CM | POA: Insufficient documentation

## 2021-10-05 DIAGNOSIS — Z96642 Presence of left artificial hip joint: Secondary | ICD-10-CM

## 2021-10-05 DIAGNOSIS — F17210 Nicotine dependence, cigarettes, uncomplicated: Secondary | ICD-10-CM | POA: Insufficient documentation

## 2021-10-05 DIAGNOSIS — Z01818 Encounter for other preprocedural examination: Secondary | ICD-10-CM

## 2021-10-05 DIAGNOSIS — F1220 Cannabis dependence, uncomplicated: Secondary | ICD-10-CM

## 2021-10-05 HISTORY — PX: TOTAL HIP ARTHROPLASTY: SHX124

## 2021-10-05 LAB — TYPE AND SCREEN
ABO/RH(D): AB NEG
Antibody Screen: NEGATIVE

## 2021-10-05 SURGERY — ARTHROPLASTY, HIP, TOTAL, ANTERIOR APPROACH
Anesthesia: Spinal | Site: Hip | Laterality: Left

## 2021-10-05 MED ORDER — PROPOFOL 500 MG/50ML IV EMUL
INTRAVENOUS | Status: AC
  Filled 2021-10-05: qty 50

## 2021-10-05 MED ORDER — TRANEXAMIC ACID 1000 MG/10ML IV SOLN
INTRAVENOUS | Status: DC | PRN
  Administered 2021-10-05: 2000 mg via TOPICAL

## 2021-10-05 MED ORDER — ACETAMINOPHEN 500 MG PO TABS
1000.0000 mg | ORAL_TABLET | Freq: Once | ORAL | Status: AC
  Administered 2021-10-05: 1000 mg via ORAL
  Filled 2021-10-05: qty 2

## 2021-10-05 MED ORDER — VANCOMYCIN HCL 1500 MG/300ML IV SOLN
1500.0000 mg | INTRAVENOUS | Status: AC
  Administered 2021-10-05: 1500 mg via INTRAVENOUS
  Filled 2021-10-05: qty 300

## 2021-10-05 MED ORDER — MENTHOL 3 MG MT LOZG: 1.0000 | LOZENGE | OROMUCOSAL | Status: DC | PRN

## 2021-10-05 MED ORDER — DEXAMETHASONE SODIUM PHOSPHATE 10 MG/ML IJ SOLN
INTRAMUSCULAR | Status: AC
  Filled 2021-10-05: qty 1

## 2021-10-05 MED ORDER — OXYCODONE HCL 5 MG/5ML PO SOLN: 5.0000 mg | Freq: Once | ORAL | Status: AC | PRN

## 2021-10-05 MED ORDER — MIDAZOLAM HCL 5 MG/5ML IJ SOLN
INTRAMUSCULAR | Status: DC | PRN
  Administered 2021-10-05: 2 mg via INTRAVENOUS

## 2021-10-05 MED ORDER — METOCLOPRAMIDE HCL 5 MG PO TABS: 5.0000 mg | ORAL_TABLET | Freq: Three times a day (TID) | ORAL | Status: DC | PRN

## 2021-10-05 MED ORDER — CHLORHEXIDINE GLUCONATE 0.12 % MT SOLN
15.0000 mL | Freq: Once | OROMUCOSAL | Status: AC
  Administered 2021-10-05: 15 mL via OROMUCOSAL

## 2021-10-05 MED ORDER — FLEET ENEMA 7-19 GM/118ML RE ENEM: 1.0000 | ENEMA | Freq: Once | RECTAL | Status: DC | PRN

## 2021-10-05 MED ORDER — PROMETHAZINE HCL 25 MG/ML IJ SOLN
INTRAMUSCULAR | Status: AC
  Filled 2021-10-05: qty 1

## 2021-10-05 MED ORDER — ORAL CARE MOUTH RINSE: 15.0000 mL | Freq: Once | OROMUCOSAL | Status: AC

## 2021-10-05 MED ORDER — LACTATED RINGERS IV SOLN: INTRAVENOUS | Status: DC

## 2021-10-05 MED ORDER — PROPOFOL 500 MG/50ML IV EMUL
INTRAVENOUS | Status: DC | PRN
  Administered 2021-10-05: 100 ug/kg/min via INTRAVENOUS

## 2021-10-05 MED ORDER — FENTANYL CITRATE PF 50 MCG/ML IJ SOSY
25.0000 ug | PREFILLED_SYRINGE | INTRAMUSCULAR | Status: DC | PRN
  Administered 2021-10-05 (×2): 50 ug via INTRAVENOUS

## 2021-10-05 MED ORDER — FENTANYL CITRATE PF 50 MCG/ML IJ SOSY
PREFILLED_SYRINGE | INTRAMUSCULAR | Status: AC
  Filled 2021-10-05: qty 1

## 2021-10-05 MED ORDER — SODIUM CHLORIDE (PF) 0.9 % IJ SOLN
INTRAMUSCULAR | Status: AC
  Filled 2021-10-05: qty 50

## 2021-10-05 MED ORDER — PHENYLEPHRINE 80 MCG/ML (10ML) SYRINGE FOR IV PUSH (FOR BLOOD PRESSURE SUPPORT)
PREFILLED_SYRINGE | INTRAVENOUS | Status: DC | PRN
  Administered 2021-10-05 (×2): 80 ug via INTRAVENOUS
  Administered 2021-10-05 (×2): 160 ug via INTRAVENOUS
  Administered 2021-10-05: 80 ug via INTRAVENOUS
  Administered 2021-10-05: 160 ug via INTRAVENOUS
  Administered 2021-10-05: 80 ug via INTRAVENOUS

## 2021-10-05 MED ORDER — 0.9 % SODIUM CHLORIDE (POUR BTL) OPTIME
TOPICAL | Status: DC | PRN
  Administered 2021-10-05: 1000 mL

## 2021-10-05 MED ORDER — ACETAMINOPHEN 325 MG PO TABS
325.0000 mg | ORAL_TABLET | Freq: Four times a day (QID) | ORAL | Status: DC | PRN
  Administered 2021-10-05 – 2021-10-06 (×2): 650 mg via ORAL
  Filled 2021-10-05 (×2): qty 2

## 2021-10-05 MED ORDER — ONDANSETRON HCL 4 MG/2ML IJ SOLN: 4.0000 mg | Freq: Four times a day (QID) | INTRAMUSCULAR | Status: DC | PRN

## 2021-10-05 MED ORDER — BUPIVACAINE LIPOSOME 1.3 % IJ SUSP
INTRAMUSCULAR | Status: DC | PRN
  Administered 2021-10-05: 10 mL

## 2021-10-05 MED ORDER — HYDROMORPHONE HCL 1 MG/ML IJ SOLN
0.5000 mg | INTRAMUSCULAR | Status: DC | PRN
  Administered 2021-10-05 – 2021-10-06 (×4): 1 mg via INTRAVENOUS
  Filled 2021-10-05 (×4): qty 1

## 2021-10-05 MED ORDER — ALBUTEROL SULFATE (2.5 MG/3ML) 0.083% IN NEBU: 2.5000 mg | INHALATION_SOLUTION | Freq: Every day | RESPIRATORY_TRACT | Status: DC | PRN

## 2021-10-05 MED ORDER — OXYCODONE HCL 5 MG PO TABS
ORAL_TABLET | ORAL | Status: AC
  Administered 2021-10-05: 5 mg via ORAL
  Filled 2021-10-05: qty 1

## 2021-10-05 MED ORDER — BISACODYL 5 MG PO TBEC: 5.0000 mg | DELAYED_RELEASE_TABLET | Freq: Every day | ORAL | Status: DC | PRN

## 2021-10-05 MED ORDER — BUPIVACAINE LIPOSOME 1.3 % IJ SUSP
INTRAMUSCULAR | Status: AC
  Filled 2021-10-05: qty 10

## 2021-10-05 MED ORDER — TIZANIDINE HCL 2 MG PO TABS: 2.0000 mg | ORAL_TABLET | Freq: Four times a day (QID) | ORAL | 0 refills | Status: DC | PRN

## 2021-10-05 MED ORDER — DIPHENHYDRAMINE HCL 12.5 MG/5ML PO ELIX: 12.5000 mg | ORAL_SOLUTION | ORAL | Status: DC | PRN

## 2021-10-05 MED ORDER — FENTANYL CITRATE (PF) 100 MCG/2ML IJ SOLN
INTRAMUSCULAR | Status: DC | PRN
  Administered 2021-10-05 (×2): 50 ug via INTRAVENOUS

## 2021-10-05 MED ORDER — OXYCODONE HCL 5 MG PO TABS: 5.0000 mg | ORAL_TABLET | Freq: Once | ORAL | Status: AC | PRN

## 2021-10-05 MED ORDER — PROPOFOL 10 MG/ML IV BOLUS
INTRAVENOUS | Status: DC | PRN
  Administered 2021-10-05: 20 mg via INTRAVENOUS
  Administered 2021-10-05: 40 mg via INTRAVENOUS

## 2021-10-05 MED ORDER — HYDROMORPHONE HCL 2 MG PO TABS
2.0000 mg | ORAL_TABLET | ORAL | Status: DC | PRN
  Administered 2021-10-06: 3 mg via ORAL
  Filled 2021-10-05: qty 2

## 2021-10-05 MED ORDER — OXYCODONE-ACETAMINOPHEN 5-325 MG PO TABS: 1.0000 | ORAL_TABLET | ORAL | 0 refills | Status: DC | PRN

## 2021-10-05 MED ORDER — PROPOFOL 1000 MG/100ML IV EMUL
INTRAVENOUS | Status: AC
  Filled 2021-10-05: qty 100

## 2021-10-05 MED ORDER — CELECOXIB 200 MG PO CAPS
200.0000 mg | ORAL_CAPSULE | Freq: Once | ORAL | Status: AC
  Administered 2021-10-05: 200 mg via ORAL
  Filled 2021-10-05: qty 1

## 2021-10-05 MED ORDER — ATORVASTATIN CALCIUM 20 MG PO TABS
20.0000 mg | ORAL_TABLET | Freq: Every day | ORAL | Status: DC
  Administered 2021-10-05 – 2021-10-06 (×2): 20 mg via ORAL
  Filled 2021-10-05 (×2): qty 1

## 2021-10-05 MED ORDER — BUPIVACAINE LIPOSOME 1.3 % IJ SUSP: 10.0000 mL | Freq: Once | INTRAMUSCULAR | Status: AC

## 2021-10-05 MED ORDER — ONDANSETRON HCL 4 MG PO TABS: 4.0000 mg | ORAL_TABLET | Freq: Four times a day (QID) | ORAL | Status: DC | PRN

## 2021-10-05 MED ORDER — OXYCODONE HCL 5 MG PO TABS
5.0000 mg | ORAL_TABLET | ORAL | Status: DC | PRN
  Administered 2021-10-05 – 2021-10-06 (×3): 10 mg via ORAL
  Filled 2021-10-05 (×4): qty 2

## 2021-10-05 MED ORDER — DEXAMETHASONE SODIUM PHOSPHATE 10 MG/ML IJ SOLN
INTRAMUSCULAR | Status: DC | PRN
  Administered 2021-10-05: 10 mg via INTRAVENOUS

## 2021-10-05 MED ORDER — PHENYLEPHRINE HCL (PRESSORS) 10 MG/ML IV SOLN
INTRAVENOUS | Status: AC
  Filled 2021-10-05: qty 1

## 2021-10-05 MED ORDER — PHENOL 1.4 % MT LIQD: 1.0000 | OROMUCOSAL | Status: DC | PRN

## 2021-10-05 MED ORDER — PROMETHAZINE HCL 25 MG/ML IJ SOLN
6.2500 mg | INTRAMUSCULAR | Status: DC | PRN
  Administered 2021-10-05: 12.5 mg via INTRAVENOUS

## 2021-10-05 MED ORDER — POLYETHYLENE GLYCOL 3350 17 G PO PACK: 17.0000 g | PACK | Freq: Every day | ORAL | Status: DC | PRN

## 2021-10-05 MED ORDER — PANTOPRAZOLE SODIUM 40 MG PO TBEC
40.0000 mg | DELAYED_RELEASE_TABLET | Freq: Every day | ORAL | Status: DC
  Administered 2021-10-05 – 2021-10-06 (×2): 40 mg via ORAL
  Filled 2021-10-05 (×2): qty 1

## 2021-10-05 MED ORDER — BUPIVACAINE-EPINEPHRINE 0.25% -1:200000 IJ SOLN
INTRAMUSCULAR | Status: DC | PRN
  Administered 2021-10-05: 30 mL

## 2021-10-05 MED ORDER — METHOCARBAMOL 1000 MG/10ML IJ SOLN
500.0000 mg | Freq: Four times a day (QID) | INTRAVENOUS | Status: DC | PRN
  Administered 2021-10-05: 500 mg via INTRAVENOUS
  Filled 2021-10-05: qty 500

## 2021-10-05 MED ORDER — ONDANSETRON HCL 4 MG/2ML IJ SOLN
INTRAMUSCULAR | Status: AC
  Filled 2021-10-05: qty 2

## 2021-10-05 MED ORDER — DEXAMETHASONE SODIUM PHOSPHATE 10 MG/ML IJ SOLN
10.0000 mg | Freq: Once | INTRAMUSCULAR | Status: AC
  Administered 2021-10-06: 10 mg via INTRAVENOUS
  Filled 2021-10-05: qty 1

## 2021-10-05 MED ORDER — NICOTINE 21 MG/24HR TD PT24
21.0000 mg | MEDICATED_PATCH | Freq: Every day | TRANSDERMAL | Status: DC
  Administered 2021-10-06: 21 mg via TRANSDERMAL
  Filled 2021-10-05 (×2): qty 1

## 2021-10-05 MED ORDER — BUPIVACAINE-EPINEPHRINE (PF) 0.25% -1:200000 IJ SOLN
INTRAMUSCULAR | Status: AC
  Filled 2021-10-05: qty 30

## 2021-10-05 MED ORDER — PHENYLEPHRINE HCL-NACL 20-0.9 MG/250ML-% IV SOLN
INTRAVENOUS | Status: DC | PRN
  Administered 2021-10-05: 35 ug/min via INTRAVENOUS

## 2021-10-05 MED ORDER — KCL IN DEXTROSE-NACL 20-5-0.45 MEQ/L-%-% IV SOLN
INTRAVENOUS | Status: DC
  Filled 2021-10-05 (×4): qty 1000

## 2021-10-05 MED ORDER — ONDANSETRON HCL 4 MG PO TABS: 4.0000 mg | ORAL_TABLET | Freq: Three times a day (TID) | ORAL | Status: DC | PRN

## 2021-10-05 MED ORDER — DOCUSATE SODIUM 100 MG PO CAPS
100.0000 mg | ORAL_CAPSULE | Freq: Two times a day (BID) | ORAL | Status: DC
  Administered 2021-10-05 – 2021-10-06 (×2): 100 mg via ORAL
  Filled 2021-10-05 (×2): qty 1

## 2021-10-05 MED ORDER — METHOCARBAMOL 500 MG PO TABS
500.0000 mg | ORAL_TABLET | Freq: Four times a day (QID) | ORAL | Status: DC | PRN
  Administered 2021-10-06 (×2): 500 mg via ORAL
  Filled 2021-10-05 (×2): qty 1

## 2021-10-05 MED ORDER — BUDESONIDE 0.25 MG/2ML IN SUSP
0.2500 mg | Freq: Two times a day (BID) | RESPIRATORY_TRACT | Status: DC
  Administered 2021-10-05 – 2021-10-06 (×2): 0.25 mg via RESPIRATORY_TRACT
  Filled 2021-10-05 (×2): qty 2

## 2021-10-05 MED ORDER — NICOTINE POLACRILEX 2 MG MT GUM: 4.0000 mg | CHEWING_GUM | OROMUCOSAL | Status: DC | PRN

## 2021-10-05 MED ORDER — SODIUM CHLORIDE (PF) 0.9 % IJ SOLN
INTRAMUSCULAR | Status: DC | PRN
  Administered 2021-10-05: 10 mL

## 2021-10-05 MED ORDER — SODIUM CHLORIDE (PF) 0.9 % IJ SOLN
INTRAMUSCULAR | Status: AC
  Filled 2021-10-05: qty 10

## 2021-10-05 MED ORDER — ASPIRIN 81 MG PO TBEC: 81.0000 mg | DELAYED_RELEASE_TABLET | Freq: Two times a day (BID) | ORAL | 0 refills | Status: DC

## 2021-10-05 MED ORDER — FENTANYL CITRATE (PF) 100 MCG/2ML IJ SOLN
INTRAMUSCULAR | Status: AC
  Filled 2021-10-05: qty 2

## 2021-10-05 MED ORDER — TRANEXAMIC ACID-NACL 1000-0.7 MG/100ML-% IV SOLN
1000.0000 mg | INTRAVENOUS | Status: AC
  Administered 2021-10-05: 1000 mg via INTRAVENOUS
  Filled 2021-10-05: qty 100

## 2021-10-05 MED ORDER — ONDANSETRON HCL 4 MG/2ML IJ SOLN
INTRAMUSCULAR | Status: DC | PRN
  Administered 2021-10-05: 4 mg via INTRAVENOUS

## 2021-10-05 MED ORDER — CETIRIZINE HCL 10 MG PO TABS
10.0000 mg | ORAL_TABLET | Freq: Every day | ORAL | Status: DC
  Administered 2021-10-06: 10 mg via ORAL
  Filled 2021-10-05: qty 1

## 2021-10-05 MED ORDER — BUPIVACAINE IN DEXTROSE 0.75-8.25 % IT SOLN
INTRATHECAL | Status: DC | PRN
  Administered 2021-10-05: 1.8 mL via INTRATHECAL

## 2021-10-05 MED ORDER — TRANEXAMIC ACID-NACL 1000-0.7 MG/100ML-% IV SOLN
1000.0000 mg | Freq: Once | INTRAVENOUS | Status: AC
  Administered 2021-10-05: 1000 mg via INTRAVENOUS
  Filled 2021-10-05: qty 100

## 2021-10-05 MED ORDER — POVIDONE-IODINE 10 % EX SWAB: 2.0000 | Freq: Once | CUTANEOUS | Status: AC

## 2021-10-05 MED ORDER — MIDAZOLAM HCL 2 MG/2ML IJ SOLN
INTRAMUSCULAR | Status: AC
  Filled 2021-10-05: qty 2

## 2021-10-05 MED ORDER — ASPIRIN 81 MG PO CHEW
81.0000 mg | CHEWABLE_TABLET | Freq: Two times a day (BID) | ORAL | Status: DC
  Administered 2021-10-05 – 2021-10-06 (×2): 81 mg via ORAL
  Filled 2021-10-05 (×2): qty 1

## 2021-10-05 MED ORDER — METOCLOPRAMIDE HCL 5 MG/ML IJ SOLN: 5.0000 mg | Freq: Three times a day (TID) | INTRAMUSCULAR | Status: DC | PRN

## 2021-10-05 SURGICAL SUPPLY — 41 items
BAG COUNTER SPONGE SURGICOUNT (BAG) ×1 IMPLANT
BAG DECANTER FOR FLEXI CONT (MISCELLANEOUS) ×2 IMPLANT
BLADE SAW SGTL 18X1.27X75 (BLADE) ×1 IMPLANT
COVER PERINEAL POST (MISCELLANEOUS) ×1 IMPLANT
COVER SURGICAL LIGHT HANDLE (MISCELLANEOUS) ×1 IMPLANT
CUP ACETBLR 52 OD 100 SERIES (Hips) IMPLANT
DRAPE STERI IOBAN 125X83 (DRAPES) ×1 IMPLANT
DRAPE U-SHAPE 47X51 STRL (DRAPES) ×2 IMPLANT
DRSG AQUACEL AG ADV 3.5X10 (GAUZE/BANDAGES/DRESSINGS) ×1 IMPLANT
DURAPREP 26ML APPLICATOR (WOUND CARE) ×1 IMPLANT
ELECT BLADE TIP CTD 4 INCH (ELECTRODE) ×1 IMPLANT
ELECT REM PT RETURN 15FT ADLT (MISCELLANEOUS) ×1 IMPLANT
ELIMINATOR HOLE APEX DEPUY (Hips) IMPLANT
GLOVE BIO SURGEON STRL SZ7.5 (GLOVE) ×1 IMPLANT
GLOVE BIO SURGEON STRL SZ8.5 (GLOVE) ×1 IMPLANT
GLOVE BIOGEL PI IND STRL 8 (GLOVE) ×1 IMPLANT
GLOVE BIOGEL PI IND STRL 9 (GLOVE) ×1 IMPLANT
GOWN STRL REUS W/ TWL XL LVL3 (GOWN DISPOSABLE) ×2 IMPLANT
GOWN STRL REUS W/TWL XL LVL3 (GOWN DISPOSABLE) ×2
HEAD CERAMIC DELTA 36 PLUS 1.5 (Hips) IMPLANT
HOLDER FOLEY CATH W/STRAP (MISCELLANEOUS) ×1 IMPLANT
KIT TURNOVER KIT A (KITS) IMPLANT
LINER NEUTRAL 52X36MM PLUS 4 (Liner) IMPLANT
MANIFOLD NEPTUNE II (INSTRUMENTS) ×1 IMPLANT
NDL HYPO 21X1.5 SAFETY (NEEDLE) ×2 IMPLANT
NEEDLE HYPO 21X1.5 SAFETY (NEEDLE) ×2 IMPLANT
NS IRRIG 1000ML POUR BTL (IV SOLUTION) ×1 IMPLANT
PACK ANTERIOR HIP CUSTOM (KITS) ×1 IMPLANT
SPIKE FLUID TRANSFER (MISCELLANEOUS) ×1 IMPLANT
STEM FEMORAL SZ5 HIGH ACTIS (Stem) IMPLANT
SUT ETHIBOND NAB CT1 #1 30IN (SUTURE) ×1 IMPLANT
SUT VIC AB 0 CT1 27 (SUTURE)
SUT VIC AB 0 CT1 27XBRD ANBCTR (SUTURE) IMPLANT
SUT VIC AB 1 CTX 36 (SUTURE) ×1
SUT VIC AB 1 CTX36XBRD ANBCTR (SUTURE) ×1 IMPLANT
SUT VIC AB 2-0 CT1 27 (SUTURE) ×1
SUT VIC AB 2-0 CT1 TAPERPNT 27 (SUTURE) ×1 IMPLANT
SUT VIC AB 3-0 CT1 27 (SUTURE) ×2
SUT VIC AB 3-0 CT1 TAPERPNT 27 (SUTURE) ×1 IMPLANT
SYR CONTROL 10ML LL (SYRINGE) ×3 IMPLANT
TRAY FOLEY MTR SLVR 16FR STAT (SET/KITS/TRAYS/PACK) IMPLANT

## 2021-10-05 NOTE — Evaluation (Signed)
Physical Therapy Evaluation Patient Details Name: Tiffany Romero MRN: [id number] DOB: 1970-03-04 Today's Date: 10/05/2021  History of Present Illness  Pt is a 51yo female presenting s/p L-THAAA on 10/05/21. PMH: GAD, bipolar, b/l carpal tunnel syndrome, chronic back pain, R-THA 2013   Clinical Impression  Tiffany Romero is a 51 y.o. female POD 0 s/p L-THA, AA. Patient reports modified independence using SPC for mobility at baseline. Patient is now limited by functional impairments (see PT problem list below) and requires supervision for bed mobility and min guard for transfers. Patient was able to ambulate 20 feet with RW and min guard level of assist. Patient instructed in exercise to facilitate ROM and circulation to manage edema. Patient will benefit from continued skilled PT interventions to address impairments and progress towards PLOF. OF NOTE: pt does NOT have 24/7 supervision/assistance at home but has neighbor who is former CNA who will be "splitting time between me and another person," pt reports.  Acute PT will follow to progress mobility and stair training in preparation for safe discharge home.       Recommendations for follow up therapy are one component of a multi-disciplinary discharge planning process, led by the attending physician.  Recommendations may be updated based on patient status, additional functional criteria and insurance authorization.  Follow Up Recommendations Follow physician's recommendations for discharge plan and follow up therapies      Assistance Recommended at Discharge Set up Supervision/Assistance  Patient can return home with the following  A little help with walking and/or transfers;A little help with bathing/dressing/bathroom;Assistance with cooking/housework;Assist for transportation;Help with stairs or ramp for entrance    Equipment Recommendations Rolling walker (2 wheels)  Recommendations for Other Services       Functional Status Assessment  Patient has had a recent decline in their functional status and demonstrates the ability to make significant improvements in function in a reasonable and predictable amount of time.     Precautions / Restrictions Precautions Precautions: Fall Restrictions Weight Bearing Restrictions: No Other Position/Activity Restrictions: wbat      Mobility  Bed Mobility Overal bed mobility: Needs Assistance Bed Mobility: Supine to Sit     Supine to sit: Supervision     General bed mobility comments: For safety only, no physical assist required    Transfers Overall transfer level: Needs assistance Equipment used: Rolling walker (2 wheels) Transfers: Sit to/from Stand Sit to Stand: Min guard           General transfer comment: Min guard for safety only, no physical assist required    Ambulation/Gait Ambulation/Gait assistance: Min guard Gait Distance (Feet): 20 Feet Assistive device: Rolling walker (2 wheels) Gait Pattern/deviations: Step-to pattern Gait velocity: decreased     General Gait Details: Pt ambulated with RW and min guard, no physical assist required or orvert LOB noted.  Stairs            Wheelchair Mobility    Modified Rankin (Stroke Patients Only)       Balance Overall balance assessment: Needs assistance Sitting-balance support: Feet supported, Bilateral upper extremity supported Sitting balance-Leahy Scale: Good     Standing balance support: Reliant on assistive device for balance, Bilateral upper extremity supported, During functional activity Standing balance-Leahy Scale: Poor                               Pertinent Vitals/Pain Pain Assessment Pain Assessment: 0-10 Pain Score: 7  Pain Location:  left hip Pain Descriptors / Indicators: Operative site guarding, Sore Pain Intervention(s): Limited activity within patient's tolerance, Monitored during session, Repositioned, Ice applied    Home Living Family/patient expects to be  discharged to:: Private residence Living Arrangements: Alone Available Help at Discharge: Neighbor;Available PRN/intermittently Type of Home: Mobile home Home Access: Stairs to enter Entrance Stairs-Rails: Left Entrance Stairs-Number of Steps: 4   Home Layout: One level Home Equipment: BSC/3in1;Toilet riser;Adaptive equipment;Cane - single point      Prior Function Prior Level of Function : Independent/Modified Independent             Mobility Comments: SPC at all times ADLs Comments: IND     Hand Dominance        Extremity/Trunk Assessment   Upper Extremity Assessment Upper Extremity Assessment: Overall WFL for tasks assessed    Lower Extremity Assessment Lower Extremity Assessment: LLE deficits/detail RLE Deficits / Details: MMT ank DF/PF 5/5 RLE Sensation: WNL LLE Deficits / Details: MMT ank DF/PF 5/5 LLE Sensation: WNL    Cervical / Trunk Assessment Cervical / Trunk Assessment: Normal  Communication   Communication: No difficulties  Cognition Arousal/Alertness: Awake/alert Behavior During Therapy: WFL for tasks assessed/performed Overall Cognitive Status: Within Functional Limits for tasks assessed                                          General Comments      Exercises Total Joint Exercises Ankle Circles/Pumps: AROM, Both, 10 reps   Assessment/Plan    PT Assessment Patient needs continued PT services  PT Problem List Decreased strength;Decreased range of motion;Decreased activity tolerance;Decreased balance;Decreased mobility;Decreased coordination;Pain       PT Treatment Interventions DME instruction;Gait training;Stair training;Functional mobility training;Therapeutic activities;Therapeutic exercise;Balance training;Neuromuscular re-education;Patient/family education    PT Goals (Current goals can be found in the Care Plan section)  Acute Rehab PT Goals Patient Stated Goal: to walk further without pain PT Goal Formulation:  With patient Time For Goal Achievement: 10/12/21 Potential to Achieve Goals: Good    Frequency 7X/week     Co-evaluation               AM-PAC PT "6 Clicks" Mobility  Outcome Measure Help needed turning from your back to your side while in a flat bed without using bedrails?: None Help needed moving from lying on your back to sitting on the side of a flat bed without using bedrails?: A Little Help needed moving to and from a bed to a chair (including a wheelchair)?: A Little Help needed standing up from a chair using your arms (e.g., wheelchair or bedside chair)?: A Little Help needed to walk in hospital room?: A Little Help needed climbing 3-5 steps with a railing? : A Little 6 Click Score: 19    End of Session Equipment Utilized During Treatment: Gait belt Activity Tolerance: Patient tolerated treatment well;No increased pain Patient left: in chair;with call bell/phone within reach;with chair alarm set Nurse Communication: Mobility status PT Visit Diagnosis: Pain;Difficulty in walking, not elsewhere classified (R26.2) Pain - Right/Left: Left Pain - part of body: Hip    Time: 2671-2458 PT Time Calculation (min) (ACUTE ONLY): 20 min   Charges:   PT Evaluation $PT Eval Low Complexity: [place], PT, DPT [facility] Office: [phone]  Coolidge Breeze 10/05/2021, 2:14 PM

## 2021-10-05 NOTE — Transfer of Care (Signed)
Immediate Anesthesia Transfer of Care Note  Patient: Tiffany Romero  Procedure(s) Performed: LEFT TOTAL HIP ARTHROPLASTY ANTERIOR APPROACH (Left: Hip)  Patient Location: PACU  Anesthesia Type:Spinal  Level of Consciousness: awake, alert  and oriented  Airway & Oxygen Therapy: Patient Spontanous Breathing and Patient connected to face mask oxygen  Post-op Assessment: Report given to RN and Post -op Vital signs reviewed and stable  Post vital signs: Reviewed and stable  Last Vitals:  Vitals Value Taken Time  BP 104/62 10/05/21 0919  Temp    Pulse 61 10/05/21 0923  Resp 15 10/05/21 0923  SpO2 99 % 10/05/21 0923  Vitals shown include unvalidated device data.  Last Pain:  Vitals:   10/05/21 0616  TempSrc:   PainSc: 8          Complications: No notable events documented.

## 2021-10-05 NOTE — Anesthesia Procedure Notes (Signed)
Spinal  Patient location during procedure: OR Start time: 10/05/2021 7:21 AM End time: 10/05/2021 7:28 AM Reason for block: surgical anesthesia Staffing Performed: anesthesiologist  Anesthesiologist: Audry Pili, MD Performed by: Audry Pili, MD Authorized by: Audry Pili, MD   Preanesthetic Checklist Completed: patient identified, IV checked, risks and benefits discussed, surgical consent, monitors and equipment checked, pre-op evaluation and timeout performed Spinal Block Patient position: sitting Prep: DuraPrep Patient monitoring: heart rate, cardiac monitor, continuous pulse ox and blood pressure Approach: midline Location: L2-3 Injection technique: single-shot Needle Needle type: Pencan and Quincke  Needle gauge: 22 G Additional Notes Consent was obtained prior to the procedure with all questions answered and concerns addressed. Risks including, but not limited to, bleeding, infection, nerve damage, paralysis, failed block, inadequate analgesia, allergic reaction, high spinal, itching, and headache were discussed and the patient wished to proceed. Functioning IV was confirmed and monitors were applied. Sterile prep and drape, including hand hygiene, mask, and sterile gloves were used. The patient was positioned and the spine was prepped. The skin was anesthetized with lidocaine. Free flow of clear CSF was obtained prior to injecting local anesthetic into the CSF. The spinal needle aspirated freely following injection. The needle was carefully withdrawn. The patient tolerated the procedure well.   Renold Don, MD

## 2021-10-05 NOTE — Discharge Instructions (Signed)

## 2021-10-05 NOTE — Op Note (Signed)
PATIENT ID:      Tiffany Romero  MRN:     [id number] DOB/AGE:    09/25/1970 / 51 y.o.  OPERATIVE REPORT   DATE OF PROCEDURE:  10/05/2021      PREOPERATIVE DIAGNOSIS:  LEFT HIP OSTEOARTHRITIS                                                         POSTOPERATIVE DIAGNOSIS:  Same                                                         PROCEDURE: Anterior L total hip arthroplasty using a 52 mm DePuy Pinnacle  Cup, [employer], 0-degree polyethylene liner, a +1.5 mm x 20mm ceramic head, a 5 hi Depuy Actis stem  SURGEON: Kerin Salen  ASSISTANT:   Kerry Hough. [employer]  (present throughout entire procedure and necessary for timely completion of the procedure)   ANESTHESIA: Spinal, Exparel 133mg  injection BLOOD LOSS: 350 cc FLUID REPLACEMENT: 1600 cc crystalloid TRANEXAMIC ACID: 1gm IV, 2gm Topical COMPLICATIONS: none    INDICATIONS FOR PROCEDURE: A 51 y.o. year-old With  LEFT HIP OSTEOARTHRITIS   for 3 years, x-rays show bone-on-bone arthritic changes, and osteophytes. Despite conservative measures with observation, anti-inflammatory medicine, narcotics, use of a cane, has severe unremitting pain and can ambulate only a few blocks before resting. Patient desires elective L total hip arthroplasty to decrease pain and increase function. The risks, benefits, and alternatives were discussed at length including but not limited to the risks of infection, bleeding, nerve injury, stiffness, blood clots, the need for revision surgery, cardiopulmonary complications, among others, and they were willing to proceed. Questions answered      PROCEDURE IN DETAIL: The patient was identified by armband,   received preoperative IV antibiotics in the holding area at [facility], taken to the operating room , appropriate anesthetic monitors   were attached and anesthesia was induced with the patient on the gurney. HANA boots were applied to the feet, and the patient  was transferred to the HANA  table with a peroneal post and support underneath the non-operative leg. Theoperative lower extremity was then prepped and draped in the usual sterile fashion from just above the iliac crest to the knee. And a timeout procedure was performed. Kerry Hough. Hardin Negus [facility] was present and scrubbed throughout the case, critical for assistance with, positioning, exposure, retraction, instrumentation, and closure.Skin along incision area was injected with 10 cc of Exparel solution. We then made a 13 cm incision along the interval at the leading edge of the tensor fascia lata of starting at 2 cm lateral to the ASIS. Small bleeders in the skin and subcutaneous tissue identified and cauterized we dissected down to the fascia and made an incision in the fascia allowing [place] to elevate the fascia of the tensor muscle and exploited the interval between the rectus and the tensor fascia lata. A Cobra retractor was then placed along the superior neck of the femur. A cerebellar retractor was used to expose the interval between the tensor fascia lata and the rectus femoris.  We identified  and cauterized the ascending branch of the anterior circumflex artery. A second Cobra retractor along the inferior neck of the femur. A small Hohmann retractor was placed underneath the origin of the rectus femoris, giving [place] good medial exposure. Using Ronguers fatty tissue was removed from in front of the anterior capsule. The capsule was then incised, starting out at the superior anterior rim of the acetabulum going laterally along the anterior neck. The capsule was then teed along the neck superiorly and inferiorly. Electrocautery was used to release capsule from the anterior and medial neck of the femur to allow external rotation. Cobra retractors were then placed along the inferior and superior neck allowing [place] to perform a standard neck cut and removed the femoral head with a power corkscrew. We then placed a medium bent homan retractor in the  cotyloid notch and posteriorly along the acetabular rim a narrow Cobra retractor. Exposed labral tissue and osteophytes were then removed. We then sequentially reamed up to a 51 mm basket reamer obtaining good coverage in all quadrants, verified by C-arm imaging. Under C-arm control we then hammered into place a 52 mm Pinnacle cup in 45 of abduction and 15 of anteversion. The cup seated nicely and required no supplemental screws. We then placed a central hole Eliminator and a 0 polyethylene liner. The foot was then externally rotated to 130-140. The limb was extended and adducted to the floor, delivering the proximal femur up into the wound. A medium curved Hohmann retractor was placed over the greater trochanter and a long Homan retractor along the posterior femoral neck completing the exposure and lateralizing the femur. We then performed releases superiorly and and inferiorly of the capsule going back to the pirformis fossa superiorly and to the lesser trochanter inferiorly. We then entered the proximal femur with the box cutting offset chisel followed by, a canal sounder, the chili pepper and broaching up to a 5 broach. This seated nicely and we reamed the calcar. A trial reduction was performed with a 1.5 mm X 36 mm head.The limb lengths were excellent the hip was stable in 90 of external rotation. At this point the trial components removed and we hammered into place a # 5hi  Offset Actis stem with Gryption coating. A + 1.5 mm x 36 ceramichead was then hammered into place. The hip was reduced and final C-arm images obtained. The wound was thoroughly irrigated with normal saline solution. We repaired the ant capsule and the tensor fascia lot a with running 0 vicryl suture. the subcutaneous tissue was closed with 2-0 and 3-0 Vicryl suture followed by an Aquacil dressing. At this point the patient was awaken and transferred to hospital gurney without difficulty.   Kerin Salen 10/05/2021, 6:53 AM

## 2021-10-05 NOTE — Interval H&P Note (Signed)
History and Physical Interval Note:  10/05/2021 6:51 AM  Tiffany Romero  has presented today for surgery, with the diagnosis of LEFT HIP OSTEOARTHRITIS.  The various methods of treatment have been discussed with the patient and family. After consideration of risks, benefits and other options for treatment, the patient has consented to  Procedure(s): LEFT TOTAL HIP ARTHROPLASTY ANTERIOR APPROACH (Left) as a surgical intervention.  The patient's history has been reviewed, patient examined, no change in status, stable for surgery.  I have reviewed the patient's chart and labs.  Questions were answered to the patient's satisfaction.     Kerin Salen

## 2021-10-05 NOTE — Anesthesia Postprocedure Evaluation (Signed)
Anesthesia Post Note  Patient: Tiffany Romero  Procedure(s) Performed: LEFT TOTAL HIP ARTHROPLASTY ANTERIOR APPROACH (Left: Hip)     Patient location during evaluation: PACU Anesthesia Type: Spinal Level of consciousness: awake and alert Pain management: pain level controlled Vital Signs Assessment: post-procedure vital signs reviewed and stable Respiratory status: spontaneous breathing and respiratory function stable Cardiovascular status: blood pressure returned to baseline and stable Postop Assessment: spinal receding and no apparent nausea or vomiting Anesthetic complications: no   No notable events documented.  Last Vitals:  Vitals:   10/05/21 1045 10/05/21 1110  BP: 118/79 133/81  Pulse: 64 66  Resp: 11 16  Temp:  36.5 C  SpO2: 97% 97%    Last Pain:  Vitals:   10/05/21 1110  TempSrc: Oral  PainSc:                  Audry Pili

## 2021-10-06 ENCOUNTER — Encounter (HOSPITAL_COMMUNITY): Payer: Self-pay | Admitting: Orthopedic Surgery

## 2021-10-06 DIAGNOSIS — M1612 Unilateral primary osteoarthritis, left hip: Secondary | ICD-10-CM | POA: Diagnosis not present

## 2021-10-06 MED ORDER — HYDROMORPHONE HCL 2 MG PO TABS
2.0000 mg | ORAL_TABLET | ORAL | Status: DC | PRN
  Administered 2021-10-06 (×2): 2 mg via ORAL
  Filled 2021-10-06 (×2): qty 1

## 2021-10-06 NOTE — Progress Notes (Signed)
Physical Therapy Treatment Patient Details Name: Tiffany Romero MRN: [id number] DOB: 10-30-1970 Today's Date: 10/06/2021   History of Present Illness Pt is a 51yo female presenting s/p L-THAAA on 10/05/21. PMH: GAD, bipolar, b/l carpal tunnel syndrome, chronic back pain, R-THA 2013    PT Comments    2nd session to practice stair negotiation. Moderate pain with activity. Pt is agreeable with d/c home on today. All education completed.    Recommendations for follow up therapy are one component of a multi-disciplinary discharge planning process, led by the attending physician.  Recommendations may be updated based on patient status, additional functional criteria and insurance authorization.  Follow Up Recommendations  Follow physician's recommendations for discharge plan and follow up therapies     Assistance Recommended at Discharge Intermittent Supervision/Assistance  Patient can return home with the following A little help with walking and/or transfers;A little help with bathing/dressing/bathroom;Assistance with cooking/housework;Assist for transportation;Help with stairs or ramp for entrance   Equipment Recommendations       Recommendations for Other Services       Precautions / Restrictions Precautions Precautions: Fall Restrictions Weight Bearing Restrictions: No Other Position/Activity Restrictions: wbat     Mobility  Bed Mobility Overal bed mobility: Needs Assistance Bed Mobility: Sit to Supine     Supine to sit: Min guard, HOB elevated Sit to supine: Min guard, HOB elevated   General bed mobility comments: Min guard for safety. Practiced with patient using gait belt. Increased time and effort.    Transfers Overall transfer level: Needs assistance Equipment used: Rolling walker (2 wheels) Transfers: Sit to/from Stand Sit to Stand: Min guard, From elevated surface           General transfer comment: Min guard. Cues for safety, technique, hand/LE  placement    Ambulation/Gait Ambulation/Gait assistance: Min guard Gait Distance (Feet): 50 Feet Assistive device: Rolling walker (2 wheels) Gait Pattern/deviations: Step-to pattern       General Gait Details: Min guard for safety. Slow but steady.   Stairs Stairs: Yes Stairs assistance: Min guard Stair Management: One rail Left, Forwards Number of Stairs: 2 General stair comments: up and over portable stairs x 1. cues for safety, technique, sequence. pt used 2 hands on 1 rail.   Wheelchair Mobility    Modified Rankin (Stroke Patients Only)       Balance Overall balance assessment: Needs assistance         Standing balance support: Reliant on assistive device for balance, Bilateral upper extremity supported, During functional activity Standing balance-Leahy Scale: Fair                              Cognition Arousal/Alertness: Awake/alert Behavior During Therapy: WFL for tasks assessed/performed Overall Cognitive Status: Within Functional Limits for tasks assessed                                          Exercises Total Joint Exercises Ankle Circles/Pumps: AROM, Both, 10 reps Quad Sets: AROM, Both, 10 reps Heel Slides: AAROM, Left, 10 reps Hip ABduction/ADduction: AAROM, Left, 10 reps    General Comments        Pertinent Vitals/Pain Pain Assessment Pain Assessment: 0-10 Pain Score: 8  Pain Location: left hip/thigh Pain Descriptors / Indicators: Operative site guarding, Sore Pain Intervention(s): Monitored during session, Repositioned, Ice applied    Home  Living                          Prior Function            PT Goals (current goals can now be found in the care plan section) Progress towards PT goals: Progressing toward goals    Frequency    7X/week      PT Plan Current plan remains appropriate    Co-evaluation              AM-PAC PT "6 Clicks" Mobility   Outcome Measure  Help  needed turning from your back to your side while in a flat bed without using bedrails?: A Little Help needed moving from lying on your back to sitting on the side of a flat bed without using bedrails?: A Little Help needed moving to and from a bed to a chair (including a wheelchair)?: A Little Help needed standing up from a chair using your arms (e.g., wheelchair or bedside chair)?: A Little Help needed to walk in hospital room?: A Little Help needed climbing 3-5 steps with a railing? : A Little 6 Click Score: 18    End of Session Equipment Utilized During Treatment: Gait belt Activity Tolerance: Patient tolerated treatment well Patient left: in bed;with call bell/phone within reach   PT Visit Diagnosis: Pain;Difficulty in walking, not elsewhere classified (R26.2) Pain - Right/Left: Left Pain - part of body: Hip     Time: 0539-7673 PT Time Calculation (min) (ACUTE ONLY): 37 min  Charges:  $Gait Training: 23-37 mins $Therapeutic Exercise: 8-22 mins                         Doreatha Massed, PT Acute Rehabilitation  Office: [phone] Pager: [phone]

## 2021-10-06 NOTE — TOC Transition Note (Signed)
Transition of Care [facility]) - CM/SW Discharge Note   Patient Details  Name: Tiffany Romero MRN: [id number] Date of Birth: July 26, 1970  [facility] CM/SW Contact:  Lennart Pall, LCSW Phone Number: 10/06/2021, 10:57 AM   Clinical Narrative:    Met with pt and confirming she has received her RW to room via [place].  HHPT prearranged with [facility].  No further TOC needs.   Final next level of care: [place] Barriers to Discharge: No Barriers Identified   Patient Goals and CMS Choice Patient states their goals for this hospitalization and ongoing recovery are:: return home      Discharge Placement                       Discharge Plan and Services                DME Arranged: Walker rolling DME Agency: [employer]       HH Arranged: PT [place] Agency: [place]        Social Determinants of Health (SDOH) Interventions     Readmission Risk Interventions     No data to display

## 2021-10-06 NOTE — Plan of Care (Signed)
  Problem: Education: Goal: Knowledge of the prescribed therapeutic regimen will improve Outcome: Progressing   Problem: Activity: Goal: Ability to tolerate increased activity will improve Outcome: Progressing   Problem: Pain Management: Goal: Pain level will decrease with appropriate interventions Outcome: Progressing   Problem: Education: Goal: Knowledge of General Education information will improve Description: Including pain rating scale, medication(s)/side effects and non-pharmacologic comfort measures Outcome: Progressing   Problem: Clinical Measurements: Goal: Ability to maintain clinical measurements within normal limits will improve Outcome: Progressing   Problem: Safety: Goal: Ability to remain free from injury will improve Outcome: Progressing

## 2021-10-06 NOTE — Progress Notes (Signed)
Physical Therapy Treatment Patient Details Name: Tiffany Romero MRN: [id number] DOB: 11-29-1970 Today's Date: 10/06/2021   History of Present Illness Pt is a 51yo female presenting s/p L-THAAA on 10/05/21. PMH: GAD, bipolar, b/l carpal tunnel syndrome, chronic back pain, R-THA 2013    PT Comments    Progressing with mobility. Pt rated pain 8/10 during session. Possible d/c home later today if pt meets PT goals.    Recommendations for follow up therapy are one component of a multi-disciplinary discharge planning process, led by the attending physician.  Recommendations may be updated based on patient status, additional functional criteria and insurance authorization.  Follow Up Recommendations  Follow physician's recommendations for discharge plan and follow up therapies     Assistance Recommended at Discharge Intermittent Supervision/Assistance  Patient can return home with the following A little help with walking and/or transfers;A little help with bathing/dressing/bathroom;Assistance with cooking/housework;Assist for transportation;Help with stairs or ramp for entrance   Equipment Recommendations       Recommendations for Other Services       Precautions / Restrictions Precautions Precautions: Fall Restrictions Weight Bearing Restrictions: No     Mobility  Bed Mobility Overal bed mobility: Needs Assistance Bed Mobility: Supine to Sit     Supine to sit: Min guard, HOB elevated     General bed mobility comments: Min guard for safety. Practiced with patient using gait belt.    Transfers Overall transfer level: Needs assistance Equipment used: Rolling walker (2 wheels) Transfers: Sit to/from Stand Sit to Stand: Min guard, From elevated surface           General transfer comment: Min guard. Cues for safety, technique, hand/LE placement    Ambulation/Gait Ambulation/Gait assistance: Min guard Gait Distance (Feet): 60 Feet Assistive device: Rolling walker (2  wheels) Gait Pattern/deviations: Step-to pattern       General Gait Details: Min guard for safety. Slow but steady.   Stairs             Wheelchair Mobility    Modified Rankin (Stroke Patients Only)       Balance Overall balance assessment: Needs assistance         Standing balance support: Reliant on assistive device for balance, Bilateral upper extremity supported, During functional activity Standing balance-Leahy Scale: Poor                              Cognition Arousal/Alertness: Awake/alert Behavior During Therapy: WFL for tasks assessed/performed Overall Cognitive Status: Within Functional Limits for tasks assessed                                          Exercises Total Joint Exercises Ankle Circles/Pumps: AROM, Both, 10 reps Quad Sets: AROM, Both, 10 reps Heel Slides: AAROM, Left, 10 reps Hip ABduction/ADduction: AAROM, Left, 10 reps    General Comments        Pertinent Vitals/Pain Pain Assessment Pain Assessment: 0-10 Pain Score: 8  Pain Location: left hip Pain Descriptors / Indicators: Operative site guarding, Sore Pain Intervention(s): Limited activity within patient's tolerance, Monitored during session, Repositioned, Ice applied    Home Living                          Prior Function  PT Goals (current goals can now be found in the care plan section) Progress towards PT goals: Progressing toward goals    Frequency    7X/week      PT Plan Current plan remains appropriate    Co-evaluation              AM-PAC PT "6 Clicks" Mobility   Outcome Measure  Help needed turning from your back to your side while in a flat bed without using bedrails?: A Little Help needed moving from lying on your back to sitting on the side of a flat bed without using bedrails?: A Little Help needed moving to and from a bed to a chair (including a wheelchair)?: A Little Help needed standing  up from a chair using your arms (e.g., wheelchair or bedside chair)?: A Little Help needed to walk in hospital room?: A Little Help needed climbing 3-5 steps with a railing? : A Little 6 Click Score: 18    End of Session Equipment Utilized During Treatment: Gait belt Activity Tolerance: Patient tolerated treatment well Patient left: in chair;with call bell/phone within reach   PT Visit Diagnosis: Pain;Difficulty in walking, not elsewhere classified (R26.2) Pain - Right/Left: Left Pain - part of body: Hip     Time: 1040-1105 PT Time Calculation (min) (ACUTE ONLY): 25 min  Charges:  $Gait Training: 8-22 mins $Therapeutic Exercise: 8-22 mins                         Doreatha Massed, PT Acute Rehabilitation  Office: [phone] Pager: [phone]

## 2021-10-06 NOTE — Discharge Summary (Signed)
Patient ID: NYEISHA GOODALL MRN: [id number] DOB/AGE: 1970/04/21 51 y.o.  Admit date: 10/05/2021 Discharge date: 10/06/2021  Admission Diagnoses:  Principal Problem:   Osteoarthritis of left hip Active Problems:   Status post total replacement of left hip   Discharge Diagnoses:  Same  Past Medical History:  Diagnosis Date   Anxiety    takes lexapro   Arthritis    djd   Bipolar 1 disorder (HCC)    not on medications at this time   Carpal tunnel syndrome, bilateral    Chronic back pain    Headache(784.0)    history of migraines related to stress   PONV (postoperative nausea and vomiting)     Surgeries: Procedure(s): LEFT TOTAL HIP ARTHROPLASTY ANTERIOR APPROACH on 10/05/2021   Consultants:   Discharged Condition: Improved  Hospital Course: WALTERINE AMODEI is an 51 y.o. female who was admitted 10/05/2021 for operative treatment ofOsteoarthritis of left hip. Patient has severe unremitting pain that affects sleep, daily activities, and work/hobbies. After pre-op clearance the patient was taken to the operating room on 10/05/2021 and underwent  Procedure(s): LEFT TOTAL HIP ARTHROPLASTY ANTERIOR APPROACH.    Patient was given perioperative antibiotics:  Anti-infectives (From admission, onward)    Start     Dose/Rate Route Frequency Ordered Stop   10/05/21 0600  vancomycin (VANCOREADY) IVPB 1500 mg/300 mL        1,500 mg 150 mL/hr over 120 Minutes Intravenous On call to O.R. 10/05/21 0540 10/05/21 0859        Patient was given sequential compression devices, early ambulation, and chemoprophylaxis to prevent DVT.  Patient benefited maximally from hospital stay and there were no complications.    Recent vital signs: Patient Vitals for the past 24 hrs:  BP Temp Temp src Pulse Resp SpO2  10/06/21 1347 113/86 98.2 F (36.8 C) Oral 95 17 95 %  10/06/21 0948 110/79 98.3 F (36.8 C) Oral 78 17 95 %  10/06/21 0837 -- -- -- -- -- 96 %  10/06/21 0606 111/85 97.7 F (36.5 C)  Oral 73 17 99 %  10/06/21 0148 119/85 98.9 F (37.2 C) Oral 78 17 96 %  10/05/21 2201 130/76 98.2 F (36.8 C) Oral 75 17 96 %  10/05/21 1934 -- -- -- -- -- 99 %  10/05/21 1717 (!) 143/90 -- -- 81 16 97 %     Recent laboratory studies: No results for input(s): "WBC", "HGB", "HCT", "PLT", "NA", "K", "CL", "CO2", "BUN", "CREATININE", "GLUCOSE", "INR", "CALCIUM" in the last 72 hours.  Invalid input(s): "PT", "2"   Discharge Medications:   Allergies as of 10/06/2021       Reactions   Cephalexin Hives   Coumadin [warfarin Sodium] Swelling   Trazodone And Nefazodone Other (See Comments)   migraine        Medication List     STOP taking these medications    acetaminophen 500 MG tablet Commonly known as: TYLENOL   ibuprofen 200 MG tablet Commonly known as: ADVIL   naproxen 250 MG tablet Commonly known as: NAPROSYN       TAKE these medications    aspirin EC 81 MG tablet Take 1 tablet (81 mg total) by mouth 2 (two) times daily.   atorvastatin 20 MG tablet Commonly known as: LIPITOR Take 20 mg by mouth daily.   Flovent HFA 110 MCG/ACT inhaler Generic drug: fluticasone Inhale 2 puffs into the lungs 2 (two) times daily.   levocetirizine 5 MG tablet Commonly known as: XYZAL  Take 5 mg by mouth daily.   nicotine 21 mg/24hr patch Commonly known as: NICODERM CQ - dosed in mg/24 hours Place 21 mg onto the skin daily.   nicotine polacrilex 4 MG gum Commonly known as: NICORETTE Take 4 mg by mouth as needed for smoking cessation.   ondansetron 4 MG tablet Commonly known as: ZOFRAN Take 4 mg by mouth every 8 (eight) hours as needed for vomiting or nausea.   oxyCODONE-acetaminophen 5-325 MG tablet Commonly known as: PERCOCET/ROXICET Take 1 tablet by mouth every 4 (four) hours as needed for severe pain.   ProAir HFA 108 (90 Base) MCG/ACT inhaler Generic drug: albuterol Inhale 2 puffs into the lungs daily as needed for shortness of breath.   tiZANidine 2 MG  tablet Commonly known as: ZANAFLEX Take 1 tablet (2 mg total) by mouth every 6 (six) hours as needed.   traMADol 50 MG tablet Commonly known as: ULTRAM Take 50 mg by mouth 3 (three) times daily as needed for severe pain.               Durable Medical Equipment  (From admission, onward)           Start     Ordered   10/05/21 1419  For home use only DME Walker rolling  Once       Question Answer Comment  Walker: With 5 Inch Wheels   Patient needs a walker to treat with the following condition Status post total hip replacement, left      10/05/21 1418   10/05/21 1117  DME Walker rolling  Once       Question:  Patient needs a walker to treat with the following condition  Answer:  Status post total hip replacement, left   10/05/21 1116   10/05/21 1117  DME 3 n 1  Once        10/05/21 1116              Discharge Care Instructions  (From admission, onward)           Start     Ordered   10/06/21 0000  Weight bearing as tolerated        10/06/21 1530            Diagnostic Studies: DG HIP UNILAT WITH PELVIS 1V LEFT  Result Date: 10/05/2021 CLINICAL DATA:  Operative left hip replacement. Cumulative air kerma: 2.34 mGy Images: 2 EXAM: DG HIP (WITH OR WITHOUT PELVIS) 1V*L* COMPARISON:  None Available. FINDINGS: Two images were obtained during left hip replacement. Hardware is in good position on provided views by the end of the study. IMPRESSION: Fluoroscopy provided during left hip replacement as above. Electronically Signed   By: Gerome Sam III M.D.   On: 10/05/2021 09:06   DG C-Arm 1-60 Min-No Report  Result Date: 10/05/2021 Fluoroscopy was utilized by the requesting physician.  No radiographic interpretation.   DG Chest 2 View  Result Date: 09/26/2021 CLINICAL DATA:  Preop evaluation, history of smoking EXAM: CHEST - 2 VIEW COMPARISON:  None Available. FINDINGS: The heart size and mediastinal contours are within normal limits. Both lungs are clear.  The visualized skeletal structures are unremarkable. Trachea midline. Minor inferior lingula scarring noted along the left cardiac border. Minimal diffuse thoracic spondylosis. IMPRESSION: No active cardiopulmonary disease. Electronically Signed   By: Judie Petit.  Shick M.D.   On: 09/26/2021 10:21    Disposition: Discharge disposition: 01-Home or Self Care       Discharge Instructions  Call MD / Call 911   Complete by: As directed    If you experience chest pain or shortness of breath, CALL 911 and be transported to the hospital emergency room.  If you develope a fever above 101 F, pus (white drainage) or increased drainage or redness at the wound, or calf pain, call your surgeon's office.   Constipation Prevention   Complete by: As directed    Drink plenty of fluids.  Prune juice may be helpful.  You may use a stool softener, such as Colace (over the counter) 100 mg twice a day.  Use MiraLax (over the counter) for constipation as needed.   Diet - low sodium heart healthy   Complete by: As directed    Driving restrictions   Complete by: As directed    No driving for 2 weeks   Increase activity slowly as tolerated   Complete by: As directed    Patient may shower   Complete by: As directed    You may shower without a dressing once there is no drainage.  Do not wash over the wound.  If drainage remains, cover wound with plastic wrap and then shower.   Post-operative opioid taper instructions:   Complete by: As directed    POST-OPERATIVE OPIOID TAPER INSTRUCTIONS: It is important to wean off of your opioid medication as soon as possible. If you do not need pain medication after your surgery it is ok to stop day one. Opioids include: Codeine, Hydrocodone(Norco, Vicodin), Oxycodone(Percocet, oxycontin) and hydromorphone amongst others.  Long term and even short term use of opiods can cause: Increased pain response Dependence Constipation Depression Respiratory depression And more.   Withdrawal symptoms can include Flu like symptoms Nausea, vomiting And more Techniques to manage these symptoms Hydrate well Eat regular healthy meals Stay active Use relaxation techniques(deep breathing, meditating, yoga) Do Not substitute Alcohol to help with tapering If you have been on opioids for less than two weeks and do not have pain than it is ok to stop all together.  Plan to wean off of opioids This plan should start within one week post op of your joint replacement. Maintain the same interval or time between taking each dose and first decrease the dose.  Cut the total daily intake of opioids by one tablet each day Next start to increase the time between doses. The last dose that should be eliminated is the evening dose.      Weight bearing as tolerated   Complete by: As directed         Follow-up Information     Frederik Pear, MD Follow up in 2 week(s).   Specialty: Orthopedic Surgery Contact information: [place] [address] [phone]         [facility], [place] Follow up.   Specialty: [place] Why: to provide home physical therapy visits Contact information: [address] [place] [address] [phone]                  Signed: Joanell Rising 10/06/2021, 3:32 PM

## 2021-10-06 NOTE — Progress Notes (Signed)
PATIENT ID: Tiffany Romero  MRN: [id number]  DOB/AGE:  [birth year] / 51 y.o.  1 Day Post-Op Procedure(s) (LRB): LEFT TOTAL HIP ARTHROPLASTY ANTERIOR APPROACH (Left)    PROGRESS NOTE Subjective: Patient is alert, oriented, no Nausea, no Vomiting, yes passing gas, . Taking PO well. Denies SOB, Chest or Calf Pain. Using Incentive Spirometer, PAS in place. Ambulate 20' Patient reports pain as  7/10, taking PO and IV pain meds  Objective: Vital signs in last 24 hours: Vitals:   10/05/21 1934 10/05/21 2201 10/06/21 0148 10/06/21 0606  BP:  130/76 119/85 111/85  Pulse:  75 78 73  Resp:  17 17 17   Temp:  98.2 F (36.8 C) 98.9 F (37.2 C) 97.7 F (36.5 C)  TempSrc:  Oral Oral Oral  SpO2: 99% 96% 96% 99%  Weight:      Height:          Intake/Output from previous day: I/O last 3 completed shifts: In: 4551.1 [P.O.:700; I.V.:3796.1; IV Piggyback:55] Out: 2500 [Urine:2300; Blood:200]   Intake/Output this shift: No intake/output data recorded.   LABORATORY DATA: No results for input(s): "WBC", "HGB", "HCT", "PLT", "NA", "K", "CL", "CO2", "BUN", "CREATININE", "GLUCOSE", "GLUCAP", "INR", "CALCIUM" in the last 72 hours.  Invalid input(s): "PT", "2"  Examination: Neurologically intact ABD soft Neurovascular intact Sensation intact distally Intact pulses distally Dorsiflexion/Plantar flexion intact Incision: dressing C/D/I No cellulitis present Compartment soft} XR AP&Lat of hip shows well placed\fixed THA  Assessment:   1 Day Post-Op Procedure(s) (LRB): LEFT TOTAL HIP ARTHROPLASTY ANTERIOR APPROACH (Left) ADDITIONAL DIAGNOSIS:  Expected Acute Blood Loss Anemia, Bipolar, Marijuana use, chronic LBP, Morbid obesity, opioid use disorder   Plan: PT/OT WBAT, THA  DVT Prophylaxis: SCDx72 hrs, ASA 81 mg BID x 2 weeks  DISCHARGE PLAN: Home, today if passes PT  DISCHARGE NEEDS: HHPT, Walker, and 3-in-1 comode seat Patient ID: Tiffany Romero, female   DOB: 1970-05-25, 51 y.o.   MRN:  [id number]

## 2022-06-09 ENCOUNTER — Emergency Department (HOSPITAL_BASED_OUTPATIENT_CLINIC_OR_DEPARTMENT_OTHER)
Admission: EM | Admit: 2022-06-09 | Discharge: 2022-06-10 | Disposition: A | Discharge status: Home / Self Care | Payer: 59 | Attending: Emergency Medicine | Admitting: Emergency Medicine

## 2022-06-09 ENCOUNTER — Emergency Department (HOSPITAL_BASED_OUTPATIENT_CLINIC_OR_DEPARTMENT_OTHER): Payer: 59

## 2022-06-09 ENCOUNTER — Other Ambulatory Visit: Payer: Self-pay

## 2022-06-09 ENCOUNTER — Encounter (HOSPITAL_BASED_OUTPATIENT_CLINIC_OR_DEPARTMENT_OTHER): Payer: Self-pay

## 2022-06-09 DIAGNOSIS — J40 Bronchitis, not specified as acute or chronic: Secondary | ICD-10-CM | POA: Insufficient documentation

## 2022-06-09 DIAGNOSIS — Z20822 Contact with and (suspected) exposure to covid-19: Secondary | ICD-10-CM | POA: Diagnosis not present

## 2022-06-09 DIAGNOSIS — R112 Nausea with vomiting, unspecified: Secondary | ICD-10-CM | POA: Diagnosis present

## 2022-06-09 DIAGNOSIS — F17210 Nicotine dependence, cigarettes, uncomplicated: Secondary | ICD-10-CM | POA: Insufficient documentation

## 2022-06-09 DIAGNOSIS — K5730 Diverticulosis of large intestine without perforation or abscess without bleeding: Secondary | ICD-10-CM | POA: Diagnosis not present

## 2022-06-09 DIAGNOSIS — R000 Tachycardia, unspecified: Secondary | ICD-10-CM | POA: Diagnosis not present

## 2022-06-09 DIAGNOSIS — I700 Atherosclerosis of aorta: Secondary | ICD-10-CM | POA: Diagnosis not present

## 2022-06-09 DIAGNOSIS — K760 Fatty (change of) liver, not elsewhere classified: Secondary | ICD-10-CM | POA: Diagnosis not present

## 2022-06-09 DIAGNOSIS — Z96643 Presence of artificial hip joint, bilateral: Secondary | ICD-10-CM | POA: Diagnosis not present

## 2022-06-09 DIAGNOSIS — R197 Diarrhea, unspecified: Secondary | ICD-10-CM | POA: Diagnosis not present

## 2022-06-09 DIAGNOSIS — R7989 Other specified abnormal findings of blood chemistry: Secondary | ICD-10-CM | POA: Insufficient documentation

## 2022-06-09 DIAGNOSIS — R21 Rash and other nonspecific skin eruption: Secondary | ICD-10-CM | POA: Insufficient documentation

## 2022-06-09 LAB — CBC
HCT: 42.7 % (ref 36.0–46.0)
Hemoglobin: 14.7 g/dL (ref 12.0–15.0)
MCH: 28.5 pg (ref 26.0–34.0)
MCHC: 34.4 g/dL (ref 30.0–36.0)
MCV: 82.9 fL (ref 80.0–100.0)
Platelets: 231 10*3/uL (ref 150–400)
RBC: 5.15 MIL/uL — ABNORMAL HIGH (ref 3.87–5.11)
RDW: 13.5 % (ref 11.5–15.5)
WBC: 7.5 10*3/uL (ref 4.0–10.5)
nRBC: 0 % (ref 0.0–0.2)

## 2022-06-09 LAB — COMPREHENSIVE METABOLIC PANEL
ALT: 55 U/L — ABNORMAL HIGH (ref 0–44)
AST: 46 U/L — ABNORMAL HIGH (ref 15–41)
Albumin: 3.8 g/dL (ref 3.5–5.0)
Alkaline Phosphatase: 87 U/L (ref 38–126)
Anion gap: 12 (ref 5–15)
BUN: 6 mg/dL (ref 6–20)
CO2: 21 mmol/L — ABNORMAL LOW (ref 22–32)
Calcium: 8.6 mg/dL — ABNORMAL LOW (ref 8.9–10.3)
Chloride: 100 mmol/L (ref 98–111)
Creatinine, Ser: 0.82 mg/dL (ref 0.44–1.00)
GFR, Estimated: >60 mL/min (ref >60)
Glucose, Bld: 114 mg/dL — ABNORMAL HIGH (ref 70–99)
Potassium: 3.1 mmol/L — ABNORMAL LOW (ref 3.5–5.1)
Sodium: 133 mmol/L — ABNORMAL LOW (ref 135–145)
Total Bilirubin: 0.3 mg/dL (ref 0.3–1.2)
Total Protein: 6.9 g/dL (ref 6.5–8.1)

## 2022-06-09 LAB — LIPASE, BLOOD: Lipase: 37 U/L (ref 11–51)

## 2022-06-09 LAB — SARS CORONAVIRUS 2 BY RT PCR: SARS Coronavirus 2 by RT PCR: NEGATIVE

## 2022-06-09 MED ORDER — ALBUTEROL SULFATE HFA 108 (90 BASE) MCG/ACT IN AERS
1.0000 | INHALATION_SPRAY | Freq: Once | RESPIRATORY_TRACT | Status: AC
  Administered 2022-06-09: 1 via RESPIRATORY_TRACT
  Filled 2022-06-09: qty 6.7

## 2022-06-09 MED ORDER — DEXAMETHASONE SODIUM PHOSPHATE 10 MG/ML IJ SOLN
10.0000 mg | Freq: Once | INTRAMUSCULAR | Status: AC
  Administered 2022-06-09: 10 mg via INTRAMUSCULAR
  Filled 2022-06-09: qty 1

## 2022-06-09 MED ORDER — OXYCODONE HCL 5 MG PO TABS
5.0000 mg | ORAL_TABLET | Freq: Once | ORAL | Status: AC
  Administered 2022-06-09: 5 mg via ORAL
  Filled 2022-06-09: qty 1

## 2022-06-09 NOTE — ED Provider Notes (Signed)
[facility] Emergency Department Provider Note MRN:  [id number]  Arrival date & time: 06/10/22     Chief Complaint   Abdominal Pain and Emesis   History of Present Illness   Tiffany Romero is a 52 y.o. year-old female with a history of bipolar disorder presenting to the ED with chief complaint of abdominal pain.  Patient explains that she was diagnosed with bronchitis and given azithromycin.  It was not getting better and so she was then given doxycycline.  It was not getting better and then she was given Augmentin.  Now she is having nausea vomiting and diarrhea.  She is also having a rash to her face.  She feels generally unwell.  Review of Systems  A thorough review of systems was obtained and all systems are negative except as noted in the HPI and PMH.   Patient's Health History    Past Medical History:  Diagnosis Date   Anxiety    takes lexapro   Arthritis    djd   Bipolar 1 disorder (HCC)    not on medications at this time   Carpal tunnel syndrome, bilateral    Chronic back pain    Headache(784.0)    history of migraines related to stress   PONV (postoperative nausea and vomiting)     Past Surgical History:  Procedure Laterality Date   ABDOMINAL HYSTERECTOMY  2007   ANTERIOR CRUCIATE LIGAMENT REPAIR     CARPAL TUNNEL RELEASE  01/24/2012   Procedure: CARPAL TUNNEL RELEASE;  Surgeon: [name], MD;  Location: [facility];  Service: Orthopedics;  Laterality: Left;   CERVIX SURGERY  1990   CESAREAN SECTION  1995   1989   FOOT SURGERY Right 2015   KNEE ARTHROSCOPY Right 1991   and [id number]   TOTAL HIP ARTHROPLASTY  01/18/2011   Procedure: TOTAL HIP ARTHROPLASTY;  Surgeon: [name];  Location: [facility] OR;  Service: Orthopedics;  Laterality: Right;  DEPUY/PINNICAL POLY   TOTAL HIP ARTHROPLASTY Left 10/05/2021   Procedure: LEFT TOTAL HIP ARTHROPLASTY ANTERIOR APPROACH;  Surgeon: Gean Birchwood, MD;  Location: [facility];   Service: Orthopedics;  Laterality: Left;   TUBAL LIGATION  1996    Family History  Problem Relation Age of Onset   Alcohol abuse Mother    Depression Mother    Alcohol abuse Father    Depression Sister    Alcohol abuse Sister    Depression Paternal Grandmother    Drug abuse Son    Alcohol abuse Son    Bipolar disorder Son     Social History   Socioeconomic History   Marital status: Significant Other    Spouse name: Not on file   Number of children: 1   Years of education: Not on file   Highest education level: Not on file  Occupational History   Not on file  Tobacco Use   Smoking status: Every Day    Packs/day: 0.25    Years: 40.00    Additional pack years: 0.00    Total pack years: 10.00    Types: Cigarettes, E-cigarettes   Smokeless tobacco: Never   Tobacco comments:    patches, reduce the # of cigarettes   Vaping Use   Vaping Use: Every day   Start date: 03/17/2021   Substances: Nicotine, Flavoring  Substance and Sexual Activity   Alcohol use: Yes    Comment: occasionally   Drug use: Yes    Types: Marijuana  Comment: 2-3 days / week   Sexual activity: Yes    Partners: Male    Birth control/protection: Surgical  Other Topics Concern   Not on file  Social History Narrative   Not on file   Social Determinants of Health   Financial Resource Strain: Not on file  Food Insecurity: No Food Insecurity (10/05/2021)   Hunger Vital Sign    Worried About Running Out of Food in the Last Year: Never true    Ran Out of Food in the Last Year: Never true  Transportation Needs: No Transportation Needs (10/05/2021)   PRAPARE - Administrator, Civil Service (Medical): No    Lack of Transportation (Non-Medical): No  Physical Activity: Not on file  Stress: Not on file  Social Connections: Not on file  Intimate Partner Violence: Not At Risk (10/05/2021)   Humiliation, Afraid, Rape, and Kick questionnaire    Fear of Current or Ex-Partner: No    Emotionally  Abused: No    Physically Abused: No    Sexually Abused: No     Physical Exam   Vitals:   06/10/22 0028 06/10/22 0233  BP: (!) 132/90 (!) 155/85  Pulse: 99 89  Resp: 19 (!) 23  Temp: 99 F (37.2 C)   SpO2: 98% 98%    CONSTITUTIONAL: Chronically ill-appearing, NAD NEURO/PSYCH:  Alert and oriented x 3, no focal deficits EYES:  eyes equal and reactive ENT/NECK:  no LAD, no JVD CARDIO: Regular rate, well-perfused, normal S1 and S2 PULM:  CTAB no wheezing or rhonchi GI/GU:  non-distended, non-tender MSK/SPINE:  No gross deformities, no edema SKIN:  no rash, atraumatic   *Additional and/or pertinent findings included in MDM below  Diagnostic and Interventional Summary    EKG Interpretation  Date/Time:  Thursday Jun 10 2022 00:01:13 EDT Ventricular Rate:  86 PR Interval:  152 QRS Duration: 93 QT Interval:  368 QTC Calculation: 441 R Axis:   59 Text Interpretation: Sinus rhythm RSR' in V1 or V2, right VCD or RVH Borderline T abnormalities, anterior leads Confirmed by Kennis Carina [phone]) on 06/10/2022 12:07:16 AM       Labs Reviewed  COMPREHENSIVE METABOLIC PANEL - Abnormal; Notable for the following components:      Result Value   Sodium 133 (*)    Potassium 3.1 (*)    CO2 21 (*)    Glucose, Bld 114 (*)    Calcium 8.6 (*)    AST 46 (*)    ALT 55 (*)    All other components within normal limits  CBC - Abnormal; Notable for the following components:   RBC 5.15 (*)    All other components within normal limits  URINALYSIS, ROUTINE W REFLEX MICROSCOPIC - Abnormal; Notable for the following components:   Hgb urine dipstick TRACE (*)    All other components within normal limits  D-DIMER, QUANTITATIVE - Abnormal; Notable for the following components:   D-Dimer, Quant 0.59 (*)    All other components within normal limits  URINALYSIS, MICROSCOPIC (REFLEX) - Abnormal; Notable for the following components:   Bacteria, UA RARE (*)    All other components within normal  limits  SARS CORONAVIRUS 2 BY RT PCR  LIPASE, BLOOD  TROPONIN I (HIGH SENSITIVITY)  TROPONIN I (HIGH SENSITIVITY)    CT Angio Chest Pulmonary Embolism (PE) W or WO Contrast  Final Result    CT ABDOMEN PELVIS W CONTRAST  Final Result    DG Chest 2 View  Final Result  Medications  dexamethasone (DECADRON) injection 10 mg (10 mg Intramuscular Given 06/09/22 2350)  albuterol (VENTOLIN HFA) 108 (90 Base) MCG/ACT inhaler 1 puff (1 puff Inhalation Given 06/09/22 2332)  oxyCODONE (Oxy IR/ROXICODONE) immediate release tablet 5 mg (5 mg Oral Given 06/09/22 2347)  iohexol (OMNIPAQUE) 350 MG/ML injection 100 mL (100 mLs Intravenous Contrast Given 06/10/22 0133)     Procedures  /  Critical Care Procedures  ED Course and Medical Decision Making  Initial Impression and Ddx Differential diagnosis includes antibiotic side effect, pneumonia, PE, colitis.  Mildly tachycardic, oral temp 99.2.  Awaiting labs, chest x-ray.  Past medical/surgical history that increases complexity of ED encounter: None  Interpretation of Diagnostics I personally reviewed the EKG and my interpretation is as follows: Sinus rhythm  Labs reassuring with no significant blood count or electrolyte disturbance.  Troponin negative.  D-dimer mildly positive.  Patient Reassessment and Ultimate Disposition/Management     CT imaging of the chest and abdomen are without acute or emergent process.  Patient seems to be feeling a bit better, vital signs are normal, no signs of emergent process at this time, appropriate for discharge.  Patient management required discussion with the following services or consulting groups:  None  Complexity of Problems Addressed Acute illness or injury that poses threat of life of bodily function  Additional Data Reviewed and Analyzed Further history obtained from: None  Additional Factors Impacting ED Encounter Risk Prescriptions  Elmer Sow. Pilar Plate, MD [facility] Emergency  Medicine [facility] [email or web address]  Final Clinical Impressions(s) / ED Diagnoses     ICD-10-CM   1. Nausea vomiting and diarrhea  R11.2    R19.7     2. Facial rash  R21       ED Discharge Orders          Ordered    ondansetron (ZOFRAN-ODT) 4 MG disintegrating tablet  Every 8 hours PRN        06/10/22 0304    clindamycin (CLINDAGEL) 1 % gel  2 times daily        06/10/22 0304             Discharge Instructions Discussed with and Provided to Patient:     Discharge Instructions      You were evaluated in the Emergency Department and after careful evaluation, we did not find any emergent condition requiring admission or further testing in the hospital.  Your exam/testing today is overall reassuring.  Use Zofran as needed for nausea.  Use the clindamycin ointment to treat the rash on your face.  Please return to the Emergency Department if you experience any worsening of your condition.   Thank you for allowing [place] to be a part of your care.       Sabas Sous, MD 06/10/22 [phone]

## 2022-06-09 NOTE — ED Triage Notes (Signed)
Pt reports that she has recently finished 3 abx for bronchitis and now has broken out on face, diarrhea, and vomiting.

## 2022-06-10 ENCOUNTER — Emergency Department (HOSPITAL_BASED_OUTPATIENT_CLINIC_OR_DEPARTMENT_OTHER): Payer: 59

## 2022-06-10 DIAGNOSIS — R112 Nausea with vomiting, unspecified: Secondary | ICD-10-CM | POA: Diagnosis not present

## 2022-06-10 LAB — URINALYSIS, MICROSCOPIC (REFLEX)

## 2022-06-10 LAB — URINALYSIS, ROUTINE W REFLEX MICROSCOPIC
Bilirubin Urine: NEGATIVE
Glucose, UA: NEGATIVE mg/dL
Ketones, ur: NEGATIVE mg/dL
Leukocytes,Ua: NEGATIVE
Nitrite: NEGATIVE
Protein, ur: NEGATIVE mg/dL
Specific Gravity, Urine: 1.015 (ref 1.005–1.030)
pH: 7 (ref 5.0–8.0)

## 2022-06-10 LAB — TROPONIN I (HIGH SENSITIVITY)
Troponin I (High Sensitivity): 2 ng/L (ref <18)
Troponin I (High Sensitivity): <2 ng/L (ref <18)

## 2022-06-10 LAB — D-DIMER, QUANTITATIVE: D-Dimer, Quant: 0.59 ug/mL-FEU — ABNORMAL HIGH (ref 0.00–0.50)

## 2022-06-10 MED ORDER — IOHEXOL 350 MG/ML SOLN
100.0000 mL | Freq: Once | INTRAVENOUS | Status: AC | PRN
  Administered 2022-06-10: 100 mL via INTRAVENOUS

## 2022-06-10 MED ORDER — CLINDAMYCIN PHOSPHATE 1 % EX GEL: Freq: Two times a day (BID) | CUTANEOUS | 0 refills | Status: DC

## 2022-06-10 MED ORDER — ONDANSETRON 4 MG PO TBDP: 4.0000 mg | ORAL_TABLET | Freq: Three times a day (TID) | ORAL | 0 refills | Status: DC | PRN

## 2022-06-10 NOTE — Discharge Instructions (Signed)
You were evaluated in the Emergency Department and after careful evaluation, we did not find any emergent condition requiring admission or further testing in the hospital.  Your exam/testing today is overall reassuring.  Use Zofran as needed for nausea.  Use the clindamycin ointment to treat the rash on your face.  Please return to the Emergency Department if you experience any worsening of your condition.   Thank you for allowing [place] to be a part of your care.

## 2022-07-29 ENCOUNTER — Ambulatory Visit (INDEPENDENT_AMBULATORY_CARE_PROVIDER_SITE_OTHER): Payer: 59 | Admitting: Allergy

## 2022-07-29 ENCOUNTER — Encounter: Payer: Self-pay | Admitting: Allergy

## 2022-07-29 VITALS — BP 140/86 | HR 89 | Temp 99.5°F | Resp 14 | Ht 68.0 in

## 2022-07-29 DIAGNOSIS — B999 Unspecified infectious disease: Secondary | ICD-10-CM

## 2022-07-29 DIAGNOSIS — J988 Other specified respiratory disorders: Secondary | ICD-10-CM | POA: Diagnosis not present

## 2022-07-29 DIAGNOSIS — K219 Gastro-esophageal reflux disease without esophagitis: Secondary | ICD-10-CM | POA: Diagnosis not present

## 2022-07-29 DIAGNOSIS — J3089 Other allergic rhinitis: Secondary | ICD-10-CM | POA: Diagnosis not present

## 2022-07-29 DIAGNOSIS — R053 Chronic cough: Secondary | ICD-10-CM

## 2022-07-29 MED ORDER — PREDNISONE 10 MG PO TABS: ORAL_TABLET | ORAL | 0 refills | Status: DC

## 2022-07-29 MED ORDER — FLUTICASONE PROPIONATE 50 MCG/ACT NA SUSP: 1.0000 | Freq: Two times a day (BID) | NASAL | 3 refills | Status: DC | PRN

## 2022-07-29 MED ORDER — AMOXICILLIN-POT CLAVULANATE 875-125 MG PO TABS: 1.0000 | ORAL_TABLET | Freq: Two times a day (BID) | ORAL | 0 refills | Status: DC

## 2022-07-29 MED ORDER — BUDESONIDE 0.5 MG/2ML IN SUSP: 0.5000 mg | Freq: Two times a day (BID) | RESPIRATORY_TRACT | 3 refills | Status: DC

## 2022-07-29 MED ORDER — AZELASTINE HCL 0.1 % NA SOLN: 1.0000 | Freq: Two times a day (BID) | NASAL | 3 refills | Status: DC | PRN

## 2022-07-29 NOTE — Assessment & Plan Note (Signed)
Rhinitis symptoms mainly in the spring. Tried zyrtec, xyzal, Singulair and Flonase recently with minimal benefit. No prior allergy/ENT evaluation. Unable to skin test today as [facility] won't allow new patient visits and procedures on the same day.  Get bloodwork for environmental panel as already needing to get bloodwork drawn.  Use azelastine nasal spray 1-2 sprays per nostril twice a day as needed for runny nose/drainage. Use Flonase (fluticasone) nasal spray 1 spray per nostril twice a day as needed for nasal congestion.  Nasal saline spray (i.e., Simply Saline) or nasal saline lavage (i.e., NeilMed) is recommended as needed and prior to medicated nasal sprays. Use over the counter antihistamines such as Zyrtec (cetirizine), Claritin (loratadine), Allegra (fexofenadine), or Xyzal (levocetirizine) daily as needed.

## 2022-07-29 NOTE — Assessment & Plan Note (Signed)
See handout for lifestyle and dietary modifications. Continue Prilosec once day - nothing to eat or drink for 20-30 minutes afterwards.

## 2022-07-29 NOTE — Progress Notes (Signed)
New Patient Note  RE: Tiffany Romero MRN: [id number] DOB: 04/18/1970 Date of Office Visit: 07/29/2022  Consult requested by: Vivien Presto, MD Primary care provider: Vivien Presto, MD  Chief Complaint: chronic cough  History of Present Illness: I had the pleasure of seeing Tiffany Romero for initial evaluation at the [facility] on 07/29/2022. She is a 52 y.o. female, who is referred here by Corrington, Kip A, MD for the evaluation of chronic cough.  She reports symptoms of coughing with post tussive emesis, chest tightness, shortness of breath, wheezing, nocturnal awakenings for 1+ year.  Current medications include Singulair, Xyzal, albuterol prn, Trelegy 1 puff once a day with unknown benefit.  She tried the following inhalers: Flovent, Breo, Arnuity. Main triggers are unknown but worse with heat, exercise and laying down.  In the last month, frequency of symptoms: daily. Frequency of nocturnal symptoms: nightly. Frequency of SABA use: albuterol neb every 6 hours x 1 month. Interference with physical activity: yes. Sleep is disturbed. In the last 12 months, emergency room visits/urgent care visits/doctor office visits or hospitalizations due to respiratory issues: 3. In the last 12 months, oral steroids courses: 4-5 times. Lifetime history of hospitalization for respiratory issues: one. Prior intubations: no. History of pneumonia: last year. She was evaluated by pulmonologist in the past. Smoking exposure: quit in 2022, 1/2 ppd x many years. Up to date with flu vaccine: no. Up to date with pneumonia vaccine: no. Up to date with COVID-19 vaccine: no. Prior Covid-19 infection: a few years ago. History of reflux: yes - takes Prilosec 20mg  daily with some benefit.   Patient also has a CPAP which she is unable to keep on due the cough.  Patient had some cardiac work up in the hospital which was unremarkable - echo, CT chest.    No prior ENT evaluation.    Patient had zpak, doxycycline, Augmentin, clindamycin in the past.  05/13/2022 pulm visit: "Plan   OSA, Moderate on CPAP ContinueAutoCPAP at 5-15 cm. water pressure We discussed the importance of weight loss and daily exercise Adjust humidity for comfort Change CPAP/Bilevel supplies as recommended by DME company and insurance company  Chronic cough Continue Trelegy 200, rinse mouth after each use Continue albuterol as needed Normal PFT 09/2021 Clear CXR 09/2021 Normal spirometry today  Call if symptoms worsen  Allergic rhinitis  Continue daily antihistamine Can consider Singulair Allergy testing Avoid allergens/triggers as able Consider allergy testing -this was quoted to be very expensive - will talk to billing  Routine lung screening in a former smoker 36 pack year history, quit smoking 09/2021 Chest CT lung screening ordered at last visit, still is not scheduled as of today's visit - patient encouraged to schedule as soon as possible - phone number given to patient at checkout "  06/10/2022 CT chest: "IMPRESSION: 1. No evidence of pulmonary arterial dilatation or embolus. 2. No acute chest CT or CTA findings. No acute CT abnormality is seen in the abdomen or pelvis. 3. Mild hepatosplenomegaly with moderate hepatic steatosis. 4. Uncomplicated sigmoid diverticulosis. 5. Bladder obscured by the patient's hip replacements but there is no upstream hydronephrosis or visible stone either side. 6. Aortic atherosclerosis, trace amount."  06/26/2022 echo: "Left Ventricle: Systolic function is normal. EF: 55-60%.    Left Ventricle: Wall motion is normal.    Aortic Valve: The aortic valve is tricuspid. The leaflets are not  thickened and exhibit normal excursion.    Aortic Valve: There  is no regurgitation.    Aortic Valve: No obvious vegetation present on the aortic valve.    Mitral Valve: The leaflets are not thickened and exhibit normal  excursion.   Mitral Valve: There is  trace regurgitation.    Mitral Valve: No obvious vegetation present on the mitral valve.    Tricuspid Valve: Tricuspid valve structure is normal. The leaflets are  not thickened.    Tricuspid Valve: No obvious vegetation present on the tricuspid valve.    Pulmonic Valve: No obvious vegetation on pulmonic valve. "  Assessment and Plan: Tiffany Romero is a 52 y.o. female with: Chronic cough Daily coughing with post tussive emesis at times for 1+ year. Currently on Trelegy and still using albuterol neb every 6 hours x 1 month with good benefit. 4-5 courses of prednisone. Followed by pulm. Had cardiac eval while hospitalized - echo, CT chest unremarkable. Spiro normal. Multiple courses of antibiotics as well. Etiology unclear.  The most common causes of chronic cough include the following: upper airway cough syndrome (UACS) which is caused by variety of rhinitis conditions; asthma; gastroesophageal reflux disease (GERD); chronic bronchitis from cigarette smoking or other inhaled environmental irritants; non-asthmatic eosinophilic bronchitis; and bronchiectasis.  In prospective studies, these conditions have accounted for up to 94% of the causes of chronic cough in immunocompetent adults.   Current cough concerning for infectious process as it sounds wet.  Start Augmentin 875mg  twice a day for 14 days. Take with probiotics. Take prednisone 40mg  daily x 2 days, 30mg  daily x 2 days, 20mg  daily x 2 days and 10mg  daily x 2 days.  Continue inhalers as per pulmonology. Daily controller medication(s): continue Trelegy 1 puff once a day and rinse mouth after each use.  During respiratory infections/flares:  Start Pulmicort (budesonide) 0.5mg  nebulizer twice a day for 1-2 weeks until your breathing symptoms return to baseline.  Pretreat with albuterol 2 puffs or albuterol nebulizer.  If you need to use your albuterol nebulizer machine back to back within 15-30 minutes with no relief then please go to the  ER/urgent care for further evaluation.  May use albuterol rescue inhaler 2 puffs or nebulizer every 4 to 6 hours as needed for shortness of breath, chest tightness, coughing, and wheezing. May use albuterol rescue inhaler 2 puffs 5 to 15 minutes prior to strenuous physical activities. Monitor frequency of use - if you need to use it more than twice per week on a consistent basis let us know.  Get bloodwork. If no improvement will refer to ENT next.  Gastroesophageal reflux disease See handout for lifestyle and dietary modifications. Continue Prilosec once day - nothing to eat or drink for 20-30 minutes afterwards.   Other allergic rhinitis Rhinitis symptoms mainly in the spring. Tried zyrtec, xyzal, Singulair and Flonase recently with minimal benefit. No prior allergy/ENT evaluation. Unable to skin test today as [facility] won't allow new patient visits and procedures on the same day.  Get bloodwork for environmental panel as already needing to get bloodwork drawn.  Use azelastine nasal spray 1-2 sprays per nostril twice a day as needed for runny nose/drainage. Use Flonase (fluticasone) nasal spray 1 spray per nostril twice a day as needed for nasal congestion.  Nasal saline spray (i.e., Simply Saline) or nasal saline lavage (i.e., NeilMed) is recommended as needed and prior to medicated nasal sprays. Use over the counter antihistamines such as Zyrtec (cetirizine), Claritin (loratadine), Allegra (fexofenadine), or Xyzal (levocetirizine) daily as needed.   Recurrent infections Pneumonias and bronchitis  in the past. Keep track of infections and antibiotics use. Get bloodwork to look at immune system.  Return in about 4 weeks (around 08/26/2022).  Meds ordered this encounter  Medications   DISCONTD: budesonide (PULMICORT) 0.5 MG/2ML nebulizer solution    Sig: Take 2 mLs (0.5 mg total) by nebulization in the morning and at bedtime.    Dispense:  120 mL    Refill:  3   azelastine (ASTELIN) 0.1 %  nasal spray    Sig: Place 1-2 sprays into both nostrils 2 (two) times daily as needed (nasal drainage). Use in each nostril as directed    Dispense:  30 mL    Refill:  3   fluticasone (FLONASE) 50 MCG/ACT nasal spray    Sig: Place 1 spray into both nostrils 2 (two) times daily as needed (nasal congestion).    Dispense:  16 g    Refill:  3   amoxicillin-clavulanate (AUGMENTIN) 875-125 MG tablet    Sig: Take 1 tablet by mouth 2 (two) times daily.    Dispense:  28 tablet    Refill:  0   predniSONE (DELTASONE) 10 MG tablet    Sig: Take prednisone 40mg  daily x 2 days, 30mg  daily x 2 days, 20mg  daily x 2 days and 10mg  daily x 2 days.    Dispense:  20 tablet    Refill:  0   budesonide (PULMICORT) 0.5 MG/2ML nebulizer solution    Sig: Take 2 mLs (0.5 mg total) by nebulization in the morning and at bedtime.    Dispense:  120 mL    Refill:  3   Lab Orders         Allergens w/Total IgE Area 2         CBC with Differential/Platelet         Comprehensive metabolic panel         Tryptase         ANA w/Reflex         IgG, IgA, IgM         Complement, total         Diphtheria / Tetanus Antibody Panel         Strep pneumoniae 23 Serotypes IgG      Other allergy screening: Rhino conjunctivitis: yes She reports symptoms of nasal congestion, PND. Symptoms have been going on for many years. The symptoms are present mainly in the spring. Anosmia: diminished sense of smell. Headache: yes. She has used zyrtec, Xyzal,Singulair, Flonase with minimal improvement in symptoms. Sinus infections: no. Previous work up includes: none. Previous ENT evaluation: no.  Food allergy: no Medication allergy: yes Hymenoptera allergy: no Urticaria: no Eczema:no History of recurrent infections suggestive of immunodeficency: no  Diagnostics: Unable to skin test today as [facility] won't allow new patient visits and procedures on the same day.   Past Medical History: Patient Active Problem List   Diagnosis Date Noted    Chronic cough 07/29/2022   Gastroesophageal reflux disease 07/29/2022   Other allergic rhinitis 07/29/2022   Recurrent infections 07/29/2022   Status post total replacement of left hip 10/05/2021   Osteoarthritis of left hip 10/02/2021   Bipolar disorder, curr episode mixed, severe, w/o psychotic features (HCC) 09/27/2016   Panic disorder 09/20/2016   GAD (generalized anxiety disorder) 09/20/2016   Cannabis use disorder, severe, dependence (HCC) 09/20/2016   Opioid use disorder, severe, in sustained remission (HCC) 09/20/2016   Arthritis of hip Right DDH 01/16/2011   Past Medical History:  Diagnosis  Date   Anxiety    takes lexapro   Arthritis    djd   Bipolar 1 disorder (HCC)    not on medications at this time   Carpal tunnel syndrome, bilateral    Chronic back pain    Headache(784.0)    history of migraines related to stress   PONV (postoperative nausea and vomiting)    Past Surgical History: Past Surgical History:  Procedure Laterality Date   ABDOMINAL HYSTERECTOMY  2007   ANTERIOR CRUCIATE LIGAMENT REPAIR     CARPAL TUNNEL RELEASE  01/24/2012   Procedure: CARPAL TUNNEL RELEASE;  Surgeon: [name], MD;  Location: [facility];  Service: Orthopedics;  Laterality: Left;   CERVIX SURGERY  1990   CESAREAN SECTION  1995   1989   FOOT SURGERY Right 2015   KNEE ARTHROSCOPY Right 1991   and [id number]   TOTAL HIP ARTHROPLASTY  01/18/2011   Procedure: TOTAL HIP ARTHROPLASTY;  Surgeon: [name];  Location: [facility];  Service: Orthopedics;  Laterality: Right;  DEPUY/PINNICAL POLY   TOTAL HIP ARTHROPLASTY Left 10/05/2021   Procedure: LEFT TOTAL HIP ARTHROPLASTY ANTERIOR APPROACH;  Surgeon: Gean Birchwood, MD;  Location: [facility];  Service: Orthopedics;  Laterality: Left;   TUBAL LIGATION  1996   Medication List:  Current Outpatient Medications  Medication Sig Dispense Refill   amoxicillin-clavulanate (AUGMENTIN) 875-125 MG tablet Take 1 tablet by mouth 2 (two)  times daily. 28 tablet 0   azelastine (ASTELIN) 0.1 % nasal spray Place 1-2 sprays into both nostrils 2 (two) times daily as needed (nasal drainage). Use in each nostril as directed 30 mL 3   fluticasone (FLONASE) 50 MCG/ACT nasal spray Place 1 spray into both nostrils 2 (two) times daily as needed (nasal congestion). 16 g 3   predniSONE (DELTASONE) 10 MG tablet Take prednisone 40mg  daily x 2 days, 30mg  daily x 2 days, 20mg  daily x 2 days and 10mg  daily x 2 days. 20 tablet 0   albuterol (PROAIR HFA) 108 (90 Base) MCG/ACT inhaler Inhale 2 puffs into the lungs daily as needed for shortness of breath.     atorvastatin (LIPITOR) 20 MG tablet Take 20 mg by mouth daily.     budesonide (PULMICORT) 0.5 MG/2ML nebulizer solution Take 2 mLs (0.5 mg total) by nebulization in the morning and at bedtime. 120 mL 3   clindamycin (CLINDAGEL) 1 % gel Apply topically 2 (two) times daily. 30 g 0   levocetirizine (XYZAL) 5 MG tablet Take 5 mg by mouth daily.     ondansetron (ZOFRAN) 4 MG tablet Take 4 mg by mouth every 8 (eight) hours as needed for vomiting or nausea.     ondansetron (ZOFRAN-ODT) 4 MG disintegrating tablet Take 1 tablet (4 mg total) by mouth every 8 (eight) hours as needed for nausea or vomiting. 20 tablet 0   No current facility-administered medications for this visit.   Allergies: Allergies  Allergen Reactions   Cephalexin Hives   Coumadin [Warfarin Sodium] Swelling   Trazodone And Nefazodone Other (See Comments)    migraine   Social History: Social History   Socioeconomic History   Marital status: Significant Other    Spouse name: Not on file   Number of children: 1   Years of education: Not on file   Highest education level: Not on file  Occupational History   Not on file  Tobacco Use   Smoking status: Every Day    Current packs/day: 0.25  Average packs/day: 0.3 packs/day for 40.0 years (10.0 ttl pk-yrs)    Types: Cigarettes, E-cigarettes   Smokeless tobacco: Never   Tobacco  comments:    patches, reduce the # of cigarettes   Vaping Use   Vaping status: Every Day   Start date: 03/17/2021   Substances: Nicotine, Flavoring  Substance and Sexual Activity   Alcohol use: Yes    Comment: occasionally   Drug use: Yes    Types: Marijuana    Comment: 2-3 days / week   Sexual activity: Yes    Partners: Male    Birth control/protection: Surgical  Other Topics Concern   Not on file  Social History Narrative   Not on file   Social Determinants of Health   Financial Resource Strain: Medium Risk (02/22/2022)   Received from [facility], [facility]   Overall Financial Resource Strain (CARDIA)    Difficulty of Paying Living Expenses: Somewhat hard  Food Insecurity: Food Insecurity Present (06/25/2022)   Received from [facility], [facility]   Hunger Vital Sign    Worried About Running Out of Food in the Last Year: Sometimes true    Ran Out of Food in the Last Year: Sometimes true  Transportation Needs: No Transportation Needs (06/26/2022)   Received from [employer], [facility]   PRAPARE - Transportation    Lack of Transportation (Medical): No    Lack of Transportation (Non-Medical): No  Physical Activity: Insufficiently Active (02/22/2022)   Received from [facility], [facility]   Exercise Vital Sign    Days of Exercise per Week: 3 days    Minutes of Exercise per Session: 20 min  Stress: Stress Concern Present (06/25/2022)   Received from [facility], [facility] - Occupational Stress Questionnaire    Feeling of Stress : To some extent  Social Connections: Socially Isolated (02/22/2022)   Received from [facility], [facility]   Social Network    How would you rate your social network (family, work, friends)?: Little participation, lonely and socially isolated   Lives in a trailer. Smoking: quit but used to smoke 1/2 ppd Occupation: disabled  Environmental HistorySurveyor, minerals in  the house: no Engineer, civil (consulting) in the family room: yes Carpet in the bedroom: no Heating: electric Cooling: window Pet: yes 1 dog x 9 yrs  Family History: Family History  Problem Relation Age of Onset   Alcohol abuse Mother    Depression Mother    Alcohol abuse Father    Depression Sister    Alcohol abuse Sister    Depression Paternal Grandmother    Drug abuse Son    Alcohol abuse Son    Bipolar disorder Son    Problem                               Relation Allergic rhino conjunctivitis     sister   Review of Systems  Constitutional:  Negative for appetite change, chills, fever and unexpected weight change.  HENT:  Negative for congestion and rhinorrhea.   Eyes:  Negative for itching.  Respiratory:  Positive for cough, chest tightness, shortness of breath and wheezing.   Cardiovascular:  Negative for chest pain.  Gastrointestinal:  Negative for abdominal pain.  Genitourinary:  Negative for difficulty urinating.  Skin:  Negative for rash.  Neurological:  Positive for headaches.    Objective: BP (!) 140/86   Pulse 89  Temp 99.5 F (37.5 C)   Resp 14   Ht 5\' 8"  (1.727 m)   SpO2 97%   BMI 37.25 kg/m  Body mass index is 37.25 kg/m. Physical Exam Vitals and nursing note reviewed.  Constitutional:      Appearance: Normal appearance. She is well-developed.  HENT:     Head: Normocephalic and atraumatic.     Right Ear: Tympanic membrane and external ear normal.     Left Ear: Tympanic membrane and external ear normal.     Nose: Nose normal.     Mouth/Throat:     Mouth: Mucous membranes are moist.     Pharynx: Oropharynx is clear.  Eyes:     Conjunctiva/sclera: Conjunctivae normal.  Cardiovascular:     Rate and Rhythm: Normal rate and regular rhythm.     Heart sounds: Normal heart sounds. No murmur heard.    No friction rub. No gallop.  Pulmonary:     Effort: Pulmonary effort is normal.     Breath sounds: No wheezing, rhonchi or rales.     Comments: Coughing during  OV. Musculoskeletal:     Cervical back: Neck supple.  Skin:    General: Skin is warm.     Findings: No rash.  Neurological:     Mental Status: She is alert and oriented to person, place, and time.  Psychiatric:        Behavior: Behavior normal.   The plan was reviewed with the patient/family, and all questions/concerned were addressed.  It was my pleasure to see Berenise today and participate in her care. Please feel free to contact me with any questions or concerns.  Sincerely,  Wyline Mood, DO Allergy & Immunology  [facility] office: [phone] [facility] office: [phone]

## 2022-07-29 NOTE — Assessment & Plan Note (Signed)
Daily coughing with post tussive emesis at times for 1+ year. Currently on Trelegy and still using albuterol neb every 6 hours x 1 month with good benefit. 4-5 courses of prednisone. Followed by pulm. Had cardiac eval while hospitalized - echo, CT chest unremarkable. Spiro normal. Multiple courses of antibiotics as well. Etiology unclear.  The most common causes of chronic cough include the following: upper airway cough syndrome (UACS) which is caused by variety of rhinitis conditions; asthma; gastroesophageal reflux disease (GERD); chronic bronchitis from cigarette smoking or other inhaled environmental irritants; non-asthmatic eosinophilic bronchitis; and bronchiectasis.  In prospective studies, these conditions have accounted for up to 94% of the causes of chronic cough in immunocompetent adults.   Current cough concerning for infectious process as it sounds wet.  Start Augmentin 875mg  twice a day for 14 days. Take with probiotics. Take prednisone 40mg  daily x 2 days, 30mg  daily x 2 days, 20mg  daily x 2 days and 10mg  daily x 2 days.  Continue inhalers as per pulmonology. Daily controller medication(s): continue Trelegy 1 puff once a day and rinse mouth after each use.  During respiratory infections/flares:  Start Pulmicort (budesonide) 0.5mg  nebulizer twice a day for 1-2 weeks until your breathing symptoms return to baseline.  Pretreat with albuterol 2 puffs or albuterol nebulizer.  If you need to use your albuterol nebulizer machine back to back within 15-30 minutes with no relief then please go to the ER/urgent care for further evaluation.  May use albuterol rescue inhaler 2 puffs or nebulizer every 4 to 6 hours as needed for shortness of breath, chest tightness, coughing, and wheezing. May use albuterol rescue inhaler 2 puffs 5 to 15 minutes prior to strenuous physical activities. Monitor frequency of use - if you need to use it more than twice per week on a consistent basis let us know.   Get bloodwork. If no improvement will refer to ENT next.

## 2022-07-29 NOTE — Patient Instructions (Addendum)
Chronic cough The most common causes of chronic cough include the following: upper airway cough syndrome (UACS) which is caused by variety of rhinitis conditions; asthma; gastroesophageal reflux disease (GERD); chronic bronchitis from cigarette smoking or other inhaled environmental irritants; non-asthmatic eosinophilic bronchitis; and bronchiectasis.  In prospective studies, these conditions have accounted for up to 94% of the causes of chronic cough in immunocompetent adults.   Current cough concerning for infectious process. Start Augmentin 875mg  twice a day for 14 days. Take with probiotics. Take prednisone 40mg  daily x 2 days, 30mg  daily x 2 days, 20mg  daily x 2 days and 10mg  daily x 2 days. See below for environmental control measures.   Possible post nasal drip Use azelastine nasal spray 1-2 sprays per nostril twice a day as needed for runny nose/drainage. Use Flonase (fluticasone) nasal spray 1 spray per nostril twice a day as needed for nasal congestion.  Nasal saline spray (i.e., Simply Saline) or nasal saline lavage (i.e., NeilMed) is recommended as needed and prior to medicated nasal sprays. Use over the counter antihistamines such as Zyrtec (cetirizine), Claritin (loratadine), Allegra (fexofenadine), or Xyzal (levocetirizine) daily as needed.   Continue inhalers as per pulmonology. Daily controller medication(s): continue Trelegy 1 puff once a day and rinse mouth after each use.  During respiratory infections/flares:  Start Pulmicort (budesonide) 0.5mg  nebulizer twice a day for 1-2 weeks until your breathing symptoms return to baseline.  Pretreat with albuterol 2 puffs or albuterol nebulizer.  If you need to use your albuterol nebulizer machine back to back within 15-30 minutes with no relief then please go to the ER/urgent care for further evaluation.   May use albuterol rescue inhaler 2 puffs or nebulizer every 4 to 6 hours as needed for shortness of breath, chest  tightness, coughing, and wheezing. May use albuterol rescue inhaler 2 puffs 5 to 15 minutes prior to strenuous physical activities. Monitor frequency of use - if you need to use it more than twice per week on a consistent basis let us know.  Breathing control goals:  Full participation in all desired activities (may need albuterol before activity) Albuterol use two times or less a week on average (not counting use with activity) Cough interfering with sleep two times or less a month Oral steroids no more than once a year No hospitalizations   Get bloodwork We are ordering labs, so please allow 1-2 weeks for the results to come back. With the newly implemented Cures Act, the labs might be visible to you at the same time that they become visible to me. However, I will not address the results until all of the results are back, so please be patient.  In the meantime, continue recommendations in your patient instructions, including avoidance measures (if applicable), until you hear from me.  Reflux:  See handout for lifestyle and dietary modifications. Continue Prilosec once day - nothing to eat or drink for 20-30 minutes afterwards.   Follow up in 4 weeks or sooner if needed.  Drink plenty of fluids. Water, juice, clear broth or warm lemon water are good choices. Avoid caffeine and alcohol, which can dehydrate you. Eat chicken soup. Chicken soup and other warm fluids can be soothing and loosen congestion. Rest. Adjust your room's temperature and humidity. Keep your room warm but not overheated. If the air is dry, a cool-mist humidifier or vaporizer can moisten the air and help ease congestion and coughing. Keep the humidifier clean to prevent the growth of bacteria and molds. Soothe  your throat. Perform a saltwater gargle. Dissolve one-quarter to a half teaspoon of salt in a 4- to 8-ounce glass of warm water. This can relieve a sore or scratchy throat temporarily. Use saline nasal drops. To  help relieve nasal congestion, try saline nasal drops. You can buy these drops over the counter, and they can help relieve symptoms ? even in children. Take over-the-counter cold and cough medications. For adults and children older than 5, over-the-counter decongestants, antihistamines and pain relievers might offer some symptom relief. However, they won't prevent a cold or shorten its duration.

## 2022-07-29 NOTE — Assessment & Plan Note (Signed)
Pneumonias and bronchitis in the past. Keep track of infections and antibiotics use. Get bloodwork to look at immune system.

## 2022-07-30 ENCOUNTER — Telehealth: Payer: Self-pay

## 2022-07-30 NOTE — Telephone Encounter (Signed)
Patient called and states Pulmicort 0.5/2 ml neb solution is too expensive for her and she would like something else called in. Please advice. Thank you

## 2022-08-01 NOTE — Telephone Encounter (Signed)
Please call patient back and ask how much it was.  There is no other form of steroid medication that can be used in a nebulizer machine.  Using [email or web address] - the cost is about $45 for 30 vials.

## 2022-08-03 ENCOUNTER — Other Ambulatory Visit: Payer: Self-pay | Admitting: *Deleted

## 2022-08-03 NOTE — Telephone Encounter (Signed)
Called and spoke with the patient. She stated that the medication was $37 with insurance but she just doesn't have the money for it. She stated that she looked on the formulary and there was another medication that was covered as an alternative. She is going to look further into it and call the pharmacy as well and will send a MyChart message tomorrow.

## 2022-08-04 ENCOUNTER — Encounter: Payer: Self-pay | Admitting: Allergy

## 2022-08-05 LAB — ALLERGENS W/TOTAL IGE AREA 2
Bermuda Grass IgE: <0.1 kU/L
Cat Dander IgE: <0.1 kU/L
Cedar, Mountain IgE: <0.1 kU/L
Cladosporium Herbarum IgE: <0.1 kU/L
Cockroach, German IgE: <0.1 kU/L
Common Silver Birch IgE: <0.1 kU/L
Cottonwood IgE: <0.1 kU/L
D Pteronyssinus IgE: <0.1 kU/L
Dog Dander IgE: <0.1 kU/L
Elm, American IgE: <0.1 kU/L
Johnson Grass IgE: <0.1 kU/L
Maple/Box Elder IgE: <0.1 kU/L
Mouse Urine IgE: <0.1 kU/L
Oak, White IgE: <0.1 kU/L
Pecan, Hickory IgE: <0.1 kU/L
Penicillium Chrysogen IgE: <0.1 kU/L
Pigweed, Rough IgE: <0.1 kU/L
Ragweed, Short IgE: <0.1 kU/L
Timothy Grass IgE: <0.1 kU/L
White Mulberry IgE: <0.1 kU/L

## 2022-08-05 LAB — CBC WITH DIFFERENTIAL/PLATELET
Basophils Absolute: 0 10*3/uL (ref 0.0–0.2)
EOS (ABSOLUTE): 0 10*3/uL (ref 0.0–0.4)
Eos: 0 %
Hematocrit: 43.4 % (ref 34.0–46.6)
Hemoglobin: 13.8 g/dL (ref 11.1–15.9)
Immature Grans (Abs): 0.1 10*3/uL (ref 0.0–0.1)
Immature Granulocytes: 1 %
Lymphocytes Absolute: 1.8 10*3/uL (ref 0.7–3.1)
Lymphs: 20 %
MCH: 27.7 pg (ref 26.6–33.0)
MCV: 87 fL (ref 79–97)
Monocytes Absolute: 0.7 10*3/uL (ref 0.1–0.9)
Monocytes: 7 %
Neutrophils Absolute: 6.6 10*3/uL (ref 1.4–7.0)
Neutrophils: 72 %
Platelets: 208 10*3/uL (ref 150–450)
RBC: 4.99 x10E6/uL (ref 3.77–5.28)
RDW: 14.8 % (ref 11.7–15.4)
WBC: 9.2 10*3/uL (ref 3.4–10.8)

## 2022-08-05 LAB — COMPREHENSIVE METABOLIC PANEL
ALT: 54 IU/L — ABNORMAL HIGH (ref 0–32)
AST: 35 IU/L (ref 0–40)
Albumin: 4.3 g/dL (ref 3.8–4.9)
BUN/Creatinine Ratio: 11 (ref 9–23)
BUN: 8 mg/dL (ref 6–24)
Calcium: 8.5 mg/dL — ABNORMAL LOW (ref 8.7–10.2)
Chloride: 104 mmol/L (ref 96–106)
Creatinine, Ser: 0.73 mg/dL (ref 0.57–1.00)
Globulin, Total: 2.3 g/dL (ref 1.5–4.5)
Glucose: 90 mg/dL (ref 70–99)
Sodium: 141 mmol/L (ref 134–144)

## 2022-08-05 LAB — IGG, IGA, IGM
IgG (Immunoglobin G), Serum: 689 mg/dL (ref 586–1602)
IgM (Immunoglobulin M), Srm: 180 mg/dL (ref 26–217)

## 2022-08-05 LAB — DIPHTHERIA / TETANUS ANTIBODY PANEL
Diphtheria Ab: 0.17 IU/mL (ref <0.10)
Tetanus Ab, IgG: 1.25 IU/mL (ref <0.10)

## 2022-08-05 LAB — STREP PNEUMONIAE 23 SEROTYPES IGG
Pneumo Ab Type 1*: 0.1 ug/mL — ABNORMAL LOW (ref >1.3)
Pneumo Ab Type 12 (12F)*: <0.1 ug/mL — ABNORMAL LOW (ref >1.3)
Pneumo Ab Type 14*: <0.1 ug/mL — ABNORMAL LOW (ref >1.3)
Pneumo Ab Type 17 (17F)*: <0.1 ug/mL — ABNORMAL LOW (ref >1.3)
Pneumo Ab Type 19 (19F)*: 0.2 ug/mL — ABNORMAL LOW (ref >1.3)
Pneumo Ab Type 2*: 0.8 ug/mL — ABNORMAL LOW (ref >1.3)
Pneumo Ab Type 20*: 0.4 ug/mL — ABNORMAL LOW (ref >1.3)
Pneumo Ab Type 22 (22F)*: 0.4 ug/mL — ABNORMAL LOW (ref >1.3)
Pneumo Ab Type 23 (23F)*: <0.1 ug/mL — ABNORMAL LOW (ref >1.3)
Pneumo Ab Type 26 (6B)*: 0.2 ug/mL — ABNORMAL LOW (ref >1.3)
Pneumo Ab Type 3*: <0.1 ug/mL — ABNORMAL LOW (ref >1.3)
Pneumo Ab Type 34 (10A)*: 0.2 ug/mL — ABNORMAL LOW (ref >1.3)
Pneumo Ab Type 4*: 0.1 ug/mL — ABNORMAL LOW (ref >1.3)
Pneumo Ab Type 43 (11A)*: 0.9 ug/mL — ABNORMAL LOW (ref >1.3)
Pneumo Ab Type 51 (7F)*: 2.7 ug/mL (ref >1.3)
Pneumo Ab Type 56 (18C)*: <0.1 ug/mL — ABNORMAL LOW (ref >1.3)
Pneumo Ab Type 8*: 13.8 ug/mL (ref >1.3)
Pneumo Ab Type 9 (9N)*: 0.3 ug/mL — ABNORMAL LOW (ref >1.3)

## 2022-08-05 LAB — COMPLEMENT, TOTAL: Compl, Total (CH50): >60 U/mL (ref >41)

## 2022-08-05 LAB — TRYPTASE: Tryptase: 3.1 ug/L (ref 2.2–13.2)

## 2022-08-25 NOTE — Progress Notes (Deleted)
Follow Up Note  RE: Tiffany Romero MRN: [id number] DOB: July 02, 1970 Date of Office Visit: 08/26/2022  Referring provider: Vivien Presto, MD Primary care provider: Vivien Presto, MD  Chief Complaint: No chief complaint on file.  History of Present Illness: I had the pleasure of seeing Tiffany Romero for a follow up visit at the [facility] on 08/25/2022. She is a 52 y.o. female, who is being followed for cough, GERD, chronic rhinitis, recurrent infections. Her previous allergy office visit was on 07/29/2022 with Dr. Selena Batten. Today is a regular follow up visit.  Environmental panel negative to indoor/outdoor allergens.   Blood count, kidney function, electrolytes, autoimmune screener, tryptase (checks for mast cell issues) were all normal which is great.   One of your liver enzymes was slightly elevated but looks like you had this in the past - I recommend to just monitor with your PCP.   Your immunoglobulin levels were normal which is great. You also have good protection against diptheria and tetanus.   However, your pneumococcal titers were low. Sometimes people with low titers are more likely to develop respiratory infections caused by the bacteria strep pneumoniae. I would like for you to get the pneumovax 23 vaccine (also known as the pneumonia shot) as it can boost the levels and offer protection against this bacteria in the future. Once you get the vaccine, we check the levels 4 weeks afterwards to make sure your immune system responded to the vaccine appropriately. You can get the pneumovax vaccine at your PCP's office or pharmacy. If they don't offer it there, let us know and in certain cases we have given them in our office.  Chronic cough Daily coughing with post tussive emesis at times for 1+ year. Currently on Trelegy and still using albuterol neb every 6 hours x 1 month with good benefit. 4-5 courses of prednisone. Followed by pulm. Had cardiac eval while  hospitalized - echo, CT chest unremarkable. Spiro normal. Multiple courses of antibiotics as well. Etiology unclear.  The most common causes of chronic cough include the following: upper airway cough syndrome (UACS) which is caused by variety of rhinitis conditions; asthma; gastroesophageal reflux disease (GERD); chronic bronchitis from cigarette smoking or other inhaled environmental irritants; non-asthmatic eosinophilic bronchitis; and bronchiectasis.  In prospective studies, these conditions have accounted for up to 94% of the causes of chronic cough in immunocompetent adults.    Current cough concerning for infectious process as it sounds wet.  Start Augmentin 875mg  twice a day for 14 days. Take with probiotics. Take prednisone 40mg  daily x 2 days, 30mg  daily x 2 days, 20mg  daily x 2 days and 10mg  daily x 2 days.   Continue inhalers as per pulmonology. Daily controller medication(s): continue Trelegy 1 puff once a day and rinse mouth after each use.  During respiratory infections/flares:  Start Pulmicort (budesonide) 0.5mg  nebulizer twice a day for 1-2 weeks until your breathing symptoms return to baseline.  Pretreat with albuterol 2 puffs or albuterol nebulizer.  If you need to use your albuterol nebulizer machine back to back within 15-30 minutes with no relief then please go to the ER/urgent care for further evaluation.  May use albuterol rescue inhaler 2 puffs or nebulizer every 4 to 6 hours as needed for shortness of breath, chest tightness, coughing, and wheezing. May use albuterol rescue inhaler 2 puffs 5 to 15 minutes prior to strenuous physical activities. Monitor frequency of use - if you need to  use it more than twice per week on a consistent basis let us know.  Get bloodwork. If no improvement will refer to ENT next.   Gastroesophageal reflux disease See handout for lifestyle and dietary modifications. Continue Prilosec once day - nothing to eat or drink for 20-30 minutes  afterwards.    Other allergic rhinitis Rhinitis symptoms mainly in the spring. Tried zyrtec, xyzal, Singulair and Flonase recently with minimal benefit. No prior allergy/ENT evaluation. Unable to skin test today as [facility] won't allow new patient visits and procedures on the same day.  Get bloodwork for environmental panel as already needing to get bloodwork drawn.  Use azelastine nasal spray 1-2 sprays per nostril twice a day as needed for runny nose/drainage. Use Flonase (fluticasone) nasal spray 1 spray per nostril twice a day as needed for nasal congestion.  Nasal saline spray (i.e., Simply Saline) or nasal saline lavage (i.e., NeilMed) is recommended as needed and prior to medicated nasal sprays. Use over the counter antihistamines such as Zyrtec (cetirizine), Claritin (loratadine), Allegra (fexofenadine), or Xyzal (levocetirizine) daily as needed.    Recurrent infections Pneumonias and bronchitis in the past. Keep track of infections and antibiotics use. Get bloodwork to look at immune system.   08/16/2022 pulm visit: "1. Chronic cough  2. OSA on CPAP  3. Non-seasonal allergic rhinitis due to other allergic trigger  4. Encounter for screening for lung cancer  5. Immunization deficiency    Plan   Cough: chronic in nature, normal PFTs. Ddx of PND or GERD. - Continue prilosec - Continue flonase and astelin - DC trelegy, patient feels that it is not making it into her lungs, change to breztri 2 puffs BID, rinse mouth after to avoid thrush discussed with patient - PRN albuterol, patient to quantify use and efficacy, doubt change to nebulizer therapy will be necessary - Elevate the head of the bed discussed - Avoid eating closer to bed time discussed - If ineffective then will need to start budesonide  OSA on CPAP: - F/U with Dr. Jerre Simon for sleep medicine  Allergic rhinitis: - Continue flonase and astelin  Screening CT: quit smoking 2023 - Screening CT ordered 06/2023  "  Assessment and Plan: [name] is a 52 y.o. female with: Chronic cough Past history - Daily coughing with post tussive emesis at times for 1+ year. Currently on Trelegy and still using albuterol neb every 6 hours x 1 month with good benefit. 4-5 courses of prednisone. Followed by pulm. Had cardiac eval while hospitalized - echo, CT chest unremarkable. Spiro normal. Multiple courses of antibiotics. Interim history -   GERD Continue lifestyle and dietary modifications. Continue Prilosec once day - nothing to eat or drink for 20-30 minutes afterwards.   Recurrent infections Past history - pneumonias and bronchitis in the past. Keep track of infections and antibiotics use.  Chronic rhinitis Past history - Tried zyrtec, xyzal, Singulair and Flonase recently with minimal benefit. No prior ENT evaluation. 2024 bloodwork negative to indoor/outdoor allergens.   No follow-ups on file.  No orders of the defined types were placed in this encounter.  Lab Orders  No laboratory test(s) ordered today    Diagnostics: Spirometry:  Tracings reviewed. Her effort: {Blank single:19197::"Good reproducible efforts.","It was hard to get consistent efforts and there is a question as to whether this reflects a maximal maneuver.","Poor effort, data can not be interpreted."} FVC: ***L FEV1: ***L, ***% predicted FEV1/FVC ratio: ***% Interpretation: {Blank single:19197::"Spirometry consistent with mild obstructive disease","Spirometry consistent with moderate obstructive disease","Spirometry  consistent with severe obstructive disease","Spirometry consistent with possible restrictive disease","Spirometry consistent with mixed obstructive and restrictive disease","Spirometry uninterpretable due to technique","Spirometry consistent with normal pattern","No overt abnormalities noted given today's efforts"}.  Please see scanned spirometry results for details.  Skin Testing: {Blank single:19197::"Select  foods","Environmental allergy panel","Environmental allergy panel and select foods","Food allergy panel","None","Deferred due to recent antihistamines use"}. *** Results discussed with patient/family.   Medication List:  Current Outpatient Medications  Medication Sig Dispense Refill   albuterol (PROAIR HFA) 108 (90 Base) MCG/ACT inhaler Inhale 2 puffs into the lungs daily as needed for shortness of breath.     amoxicillin-clavulanate (AUGMENTIN) 875-125 MG tablet Take 1 tablet by mouth 2 (two) times daily. 28 tablet 0   atorvastatin (LIPITOR) 20 MG tablet Take 20 mg by mouth daily.     azelastine (ASTELIN) 0.1 % nasal spray Place 1-2 sprays into both nostrils 2 (two) times daily as needed (nasal drainage). Use in each nostril as directed 30 mL 3   budesonide (PULMICORT) 0.5 MG/2ML nebulizer solution Take 2 mLs (0.5 mg total) by nebulization in the morning and at bedtime. 120 mL 3   clindamycin (CLINDAGEL) 1 % gel Apply topically 2 (two) times daily. 30 g 0   fluticasone (FLONASE) 50 MCG/ACT nasal spray Place 1 spray into both nostrils 2 (two) times daily as needed (nasal congestion). 16 g 3   levocetirizine (XYZAL) 5 MG tablet Take 5 mg by mouth daily.     ondansetron (ZOFRAN) 4 MG tablet Take 4 mg by mouth every 8 (eight) hours as needed for vomiting or nausea.     ondansetron (ZOFRAN-ODT) 4 MG disintegrating tablet Take 1 tablet (4 mg total) by mouth every 8 (eight) hours as needed for nausea or vomiting. 20 tablet 0   predniSONE (DELTASONE) 10 MG tablet Take prednisone 40mg  daily x 2 days, 30mg  daily x 2 days, 20mg  daily x 2 days and 10mg  daily x 2 days. 20 tablet 0   No current facility-administered medications for this visit.   Allergies: Allergies  Allergen Reactions   Cephalexin Hives   Coumadin [Warfarin Sodium] Swelling   Trazodone And Nefazodone Other (See Comments)    migraine   I reviewed her past medical history, social history, family history, and environmental history and  no significant changes have been reported from her previous visit.  Review of Systems  Constitutional:  Negative for appetite change, chills, fever and unexpected weight change.  HENT:  Negative for congestion and rhinorrhea.   Eyes:  Negative for itching.  Respiratory:  Positive for cough, chest tightness, shortness of breath and wheezing.   Cardiovascular:  Negative for chest pain.  Gastrointestinal:  Negative for abdominal pain.  Genitourinary:  Negative for difficulty urinating.  Skin:  Negative for rash.  Neurological:  Positive for headaches.    Objective: There were no vitals taken for this visit. There is no height or weight on file to calculate BMI. Physical Exam Vitals and nursing note reviewed.  Constitutional:      Appearance: Normal appearance. She is well-developed.  HENT:     Head: Normocephalic and atraumatic.     Right Ear: Tympanic membrane and external ear normal.     Left Ear: Tympanic membrane and external ear normal.     Nose: Nose normal.     Mouth/Throat:     Mouth: Mucous membranes are moist.     Pharynx: Oropharynx is clear.  Eyes:     Conjunctiva/sclera: Conjunctivae normal.  Cardiovascular:  Rate and Rhythm: Normal rate and regular rhythm.     Heart sounds: Normal heart sounds. No murmur heard.    No friction rub. No gallop.  Pulmonary:     Effort: Pulmonary effort is normal.     Breath sounds: No wheezing, rhonchi or rales.     Comments: Coughing during OV. Musculoskeletal:     Cervical back: Neck supple.  Skin:    General: Skin is warm.     Findings: No rash.  Neurological:     Mental Status: She is alert and oriented to person, place, and time.  Psychiatric:        Behavior: Behavior normal.    Previous notes and tests were reviewed. The plan was reviewed with the patient/family, and all questions/concerned were addressed.  It was my pleasure to see Rykia today and participate in her care. Please feel free to contact me with any  questions or concerns.  Sincerely,  Wyline Mood, DO Allergy & Immunology  [facility] office: [phone] [facility] office: [phone]

## 2022-08-26 ENCOUNTER — Ambulatory Visit: Payer: 59 | Admitting: Allergy

## 2022-08-26 DIAGNOSIS — K219 Gastro-esophageal reflux disease without esophagitis: Secondary | ICD-10-CM

## 2022-08-26 DIAGNOSIS — B999 Unspecified infectious disease: Secondary | ICD-10-CM

## 2022-08-26 DIAGNOSIS — J310 Chronic rhinitis: Secondary | ICD-10-CM

## 2022-08-26 DIAGNOSIS — R053 Chronic cough: Secondary | ICD-10-CM

## 2022-09-06 NOTE — Progress Notes (Signed)
Follow Up Note  RE: Tiffany Romero MRN: [id number] DOB: 1970/04/25 Date of Office Visit: 09/07/2022  Referring provider: Vivien Presto, MD Primary care provider: Vivien Presto, MD  Chief Complaint: Follow-up (Cough is a bit better but it still going on. Has been taking muscle relaxer's that seem to help with the cough. )  History of Present Illness: I had the pleasure of seeing Tiffany Romero for a follow up visit at the [facility] on 09/07/2022. She is a 52 y.o. female, who is being followed for chronic cough, GERD, chronic rhinitis and recurrent infections. Her previous allergy office visit was on 07/29/2022 with Dr. Selena Batten. Today is a regular follow up visit.  Chronic cough Coughing is slightly better. Saw pulmonology and was started on Breztri 2 puffs twice a day. Using spacer and rinsing mouth after each use. Patient is also taking Trelegy - reviewed their discharge instructions and patient somehow missed the part to d/c Trelegy.   Using albuterol nebulizer 3-4 times per day which is less than before.   No prednisone since the last visit.  Took Augmentin and prednisone and not sure if it helped.  Quit smoking September 2023 - 1/2ppd x many years.   Not able fill Pulmicort nebulizer due to cost.  Wants ENT referral as coughing is bothersome. She also started baclofen by PCP and it may be helping a little.   Gastroesophageal reflux disease Taking Prilosec once day and doing well.    Rhinitis Taking Xyzal, singulair, Flonase, azelastine prn.  Recurrent infections Patient did not get pneumonia shot yet.   Assessment and Plan: Tiffany Romero is a 52 y.o. female with: Chronic cough Past history - Daily coughing with post tussive emesis at times for 1+ year. Currently on Trelegy and still using albuterol neb every 6 hours x 1 month with good benefit. 4-5 courses of prednisone. Followed by pulm. Had cardiac eval while hospitalized - echo, CT chest  unremarkable. Spiro normal. Multiple courses of antibiotics as well.  Interim history - taking both Trelegy and Breztri due to patient misunderstanding pulm's instructions, still using albuterol neb 3-4 times per day, no change with pred and Augmentin. Taking baclofen. Coughing slightly improved.  Today's spirometry was normal. Refer to ENT for chronic cough.  Try to take some honey to soothe the throat/coughing.  Continue inhalers as per pulmonology. Daily controller medication(s): continue Breztri 2 puffs twice a day with spacer and rinse mouth afterwards. STOP Trelegy May use albuterol rescue inhaler 2 puffs or nebulizer every 4 to 6 hours as needed for shortness of breath, chest tightness, coughing, and wheezing. May use albuterol rescue inhaler 2 puffs 5 to 15 minutes prior to strenuous physical activities. Monitor frequency of use - if you need to use it more than twice per week on a consistent basis let us know.   Gastroesophageal reflux disease Controlled.  Continue lifestyle and dietary modifications. Continue Prilosec once day - nothing to eat or drink for 20-30 minutes afterwards.   Recurrent infections Past history - Pneumonias and bronchitis in the past.  Interim history - 2024 bloodwork normal immunoglobulin levels, poor pneumococcal titers.  Keep track of infections and antibiotics use. Get pneumovax vaccine (pneumonia shot).  Chronic rhinitis Past history - Rhinitis symptoms mainly in the spring. Tried zyrtec, Xyzal, Singulair and Flonase recently with minimal benefit.  Interim history - not sure if meds helping. 2024 bloodwork negative environmental panel but IgE was in the 800s.  Use azelastine nasal  spray 1-2 sprays per nostril twice a day as needed for runny nose/drainage. Use Flonase (fluticasone) nasal spray 1 spray per nostril twice a day as needed for nasal congestion.  Nasal saline spray (i.e., Simply Saline) or nasal saline lavage (i.e., NeilMed) is recommended as  needed and prior to medicated nasal sprays. Use over the counter antihistamines such as Zyrtec (cetirizine), Claritin (loratadine), Allegra (fexofenadine), or Xyzal (levocetirizine) daily as needed.   Return in about 3 months (around 12/08/2022).  No orders of the defined types were placed in this encounter.  Lab Orders  No laboratory test(s) ordered today    Diagnostics: Spirometry:  Tracings reviewed. Her effort: Good reproducible efforts. FVC: 3.58L FEV1: 3.15L, 100% predicted FEV1/FVC ratio: 88% Interpretation: Spirometry consistent with normal pattern.  Please see scanned spirometry results for details.  Medication List:  Current Outpatient Medications  Medication Sig Dispense Refill   albuterol (PROAIR HFA) 108 (90 Base) MCG/ACT inhaler Inhale 2 puffs into the lungs daily as needed for shortness of breath.     atorvastatin (LIPITOR) 20 MG tablet Take 20 mg by mouth daily.     azelastine (ASTELIN) 0.1 % nasal spray Place 1-2 sprays into both nostrils 2 (two) times daily as needed (nasal drainage). Use in each nostril as directed 30 mL 3   baclofen (LIORESAL) 10 MG tablet Take by mouth.     BREZTRI AEROSPHERE 160-9-4.8 MCG/ACT AERO Inhale 2 puffs into the lungs 2 (two) times daily.     clindamycin (CLINDAGEL) 1 % gel Apply topically 2 (two) times daily. 30 g 0   fluticasone (FLONASE) 50 MCG/ACT nasal spray Place 1 spray into both nostrils 2 (two) times daily as needed (nasal congestion). 16 g 3   levocetirizine (XYZAL) 5 MG tablet Take 5 mg by mouth daily.     mirtazapine (REMERON SOL-TAB) 30 MG disintegrating tablet Take by mouth.     montelukast (SINGULAIR) 10 MG tablet Take 10 mg by mouth daily.     omeprazole (PRILOSEC) 20 MG capsule Take by mouth.     No current facility-administered medications for this visit.   Allergies: Allergies  Allergen Reactions   Cephalexin Hives   Coumadin [Warfarin Sodium] Swelling   Trazodone And Nefazodone Other (See Comments)     migraine   I reviewed her past medical history, social history, family history, and environmental history and no significant changes have been reported from her previous visit.  Review of Systems  Constitutional:  Negative for appetite change, chills, fever and unexpected weight change.  HENT:  Negative for congestion and rhinorrhea.   Eyes:  Negative for itching.  Respiratory:  Positive for cough, chest tightness and shortness of breath. Negative for wheezing.   Cardiovascular:  Negative for chest pain.  Gastrointestinal:  Negative for abdominal pain.  Genitourinary:  Negative for difficulty urinating.  Skin:  Negative for rash.  Neurological:  Positive for headaches.    Objective: BP 114/70   Pulse 77   Temp 97.9 F (36.6 C)   Resp 20   SpO2 95%  There is no height or weight on file to calculate BMI. Physical Exam Vitals and nursing note reviewed.  Constitutional:      Appearance: Normal appearance. She is well-developed.  HENT:     Head: Normocephalic and atraumatic.     Right Ear: Tympanic membrane and external ear normal.     Left Ear: Tympanic membrane and external ear normal.     Nose: Nose normal.     Mouth/Throat:  Mouth: Mucous membranes are moist.     Pharynx: Oropharynx is clear.  Eyes:     Conjunctiva/sclera: Conjunctivae normal.  Cardiovascular:     Rate and Rhythm: Normal rate and regular rhythm.     Heart sounds: Normal heart sounds. No murmur heard.    No friction rub. No gallop.  Pulmonary:     Effort: Pulmonary effort is normal.     Breath sounds: No wheezing, rhonchi or rales.     Comments: Coughing during OV - mainly right after spirometry. Less than last OV. Musculoskeletal:     Cervical back: Neck supple.  Skin:    General: Skin is warm.     Findings: No rash.  Neurological:     Mental Status: She is alert and oriented to person, place, and time.  Psychiatric:        Behavior: Behavior normal.    Previous notes and tests were  reviewed. The plan was reviewed with the patient/family, and all questions/concerned were addressed.  It was my pleasure to see Tiffany Romero today and participate in her care. Please feel free to contact me with any questions or concerns.  Sincerely,  Wyline Mood, DO Allergy & Immunology  [facility] office: [phone] [facility] office: [phone]

## 2022-09-07 ENCOUNTER — Ambulatory Visit: Payer: 59

## 2022-09-07 ENCOUNTER — Telehealth: Payer: Self-pay

## 2022-09-07 ENCOUNTER — Encounter: Payer: Self-pay | Admitting: Allergy

## 2022-09-07 ENCOUNTER — Ambulatory Visit (INDEPENDENT_AMBULATORY_CARE_PROVIDER_SITE_OTHER): Payer: 59 | Admitting: Allergy

## 2022-09-07 VITALS — BP 114/70 | HR 77 | Temp 97.9°F | Resp 20

## 2022-09-07 DIAGNOSIS — K219 Gastro-esophageal reflux disease without esophagitis: Secondary | ICD-10-CM | POA: Diagnosis not present

## 2022-09-07 DIAGNOSIS — R053 Chronic cough: Secondary | ICD-10-CM | POA: Diagnosis not present

## 2022-09-07 DIAGNOSIS — J310 Chronic rhinitis: Secondary | ICD-10-CM | POA: Diagnosis not present

## 2022-09-07 DIAGNOSIS — B999 Unspecified infectious disease: Secondary | ICD-10-CM | POA: Diagnosis not present

## 2022-09-07 NOTE — Patient Instructions (Addendum)
Chronic cough Refer to ENT for chronic cough.  Try to take some honey to soothe the throat/coughing.   Use azelastine nasal spray 1-2 sprays per nostril twice a day as needed for runny nose/drainage. Use Flonase (fluticasone) nasal spray 1 spray per nostril twice a day as needed for nasal congestion.  Nasal saline spray (i.e., Simply Saline) or nasal saline lavage (i.e., NeilMed) is recommended as needed and prior to medicated nasal sprays. Use over the counter antihistamines such as Zyrtec (cetirizine), Claritin (loratadine), Allegra (fexofenadine), or Xyzal (levocetirizine) daily as needed.   Continue inhalers as per pulmonology. Daily controller medication(s): continue Breztri 2 puffs twice a day with spacer and rinse mouth afterwards. STOP Trelegy May use albuterol rescue inhaler 2 puffs or nebulizer every 4 to 6 hours as needed for shortness of breath, chest tightness, coughing, and wheezing. May use albuterol rescue inhaler 2 puffs 5 to 15 minutes prior to strenuous physical activities. Monitor frequency of use - if you need to use it more than twice per week on a consistent basis let us know.  Breathing control goals:  Full participation in all desired activities (may need albuterol before activity) Albuterol use two times or less a week on average (not counting use with activity) Cough interfering with sleep two times or less a month Oral steroids no more than once a year No hospitalizations   Reflux:  Continue lifestyle and dietary modifications. Continue Prilosec once day - nothing to eat or drink for 20-30 minutes afterwards.   Infections Keep track of infections and antibiotics use. Get pneumovax vaccine (pneumonia shot).  Follow up in 2-3 months or sooner if needed.

## 2022-09-07 NOTE — Telephone Encounter (Signed)
Per Dr Selena Batten Ent referral for chronic cough please and thank you

## 2022-09-15 NOTE — Telephone Encounter (Addendum)
Referral placed via epic to [facility]. Patient updated via MyChart.

## 2022-11-04 ENCOUNTER — Other Ambulatory Visit: Payer: Self-pay | Admitting: Allergy

## 2022-11-22 ENCOUNTER — Ambulatory Visit (INDEPENDENT_AMBULATORY_CARE_PROVIDER_SITE_OTHER): Payer: 59 | Admitting: Otolaryngology

## 2022-11-22 ENCOUNTER — Encounter (INDEPENDENT_AMBULATORY_CARE_PROVIDER_SITE_OTHER): Payer: Self-pay

## 2022-11-22 ENCOUNTER — Telehealth: Payer: Self-pay | Admitting: Otolaryngology

## 2022-11-22 VITALS — Ht 69.0 in | Wt 245.0 lb

## 2022-11-22 DIAGNOSIS — R053 Chronic cough: Secondary | ICD-10-CM

## 2022-11-22 DIAGNOSIS — R490 Dysphonia: Secondary | ICD-10-CM

## 2022-11-22 DIAGNOSIS — R0989 Other specified symptoms and signs involving the circulatory and respiratory systems: Secondary | ICD-10-CM | POA: Diagnosis not present

## 2022-11-22 MED ORDER — GABAPENTIN 100 MG PO CAPS: 100.0000 mg | ORAL_CAPSULE | Freq: Three times a day (TID) | ORAL | 3 refills | Status: DC

## 2022-11-22 NOTE — Progress Notes (Signed)
Dear Dr. Selena Batten, Here is my assessment for our mutual patient, Tiffany Romero. Thank you for allowing me the opportunity to care for your patient. Please do not hesitate to contact me should you have any other questions. Sincerely, Dr. Jovita Kussmaul  Otolaryngology Clinic Note Referring provider: Dr. Selena Batten HPI:  Tiffany Romero is a 52 y.o. female kindly referred by Dr. Selena Batten for evaluation of chronic cough.  Coughing started around 2022 Fall when she had COVID. She has been seen by Pulm multiple occasions, started on trelegy (prior), Breztri (currently using) and albuterol with some improvement but not resolved. She does clear her throat a lot she reports. She reports there is no obvious trigger, but does report it is mostly dry except for morning. Strong smells or perfumes or pressure/temp do not trigger it. She reports sipping water helps. She does not know what exactly causes it or stops it. Occurs day and night. No significant discolored drainage. No TB Rfx. She denies frequent sinus infections or cardinal sinonasal symptoms except for some PND.   She also is on a PPI for GERD and sees allergy - on flonase and astelin, xyzal and singulair She also was on Augmentin and prednisone recently and did not think it helped. She has tried cough lozenges, did not help. Cough suppressants (hydrocodone) does help.    She has had PFTs in 2023, which were normal.  Some intermittent hoarseness where she reports voice is worse with use and end of day. No SOB, hemoptysis, weight loss, neck masses, odynophagia, dysphagia  H&N Surgery: denies Personal or FHx of bleeding dz or anesthesia difficulty: no   AP/AC: no  Tobacco: 35 pack year, quit 11/2021. Occupation: disabled  PMHx: Chronic Cough, OSA on CPAP, Pain, Migraine   Independent Review of Additional Tests or Records:  PFT 09/2021: FEV 1 101, TLC 118%, DLCO 88%, FEV1/FVC 80.  Strpe titers low, IgG, IgA, IgM = normal (2024) IgE 852 (2024), otherwise RAST  testing generally neg Eos = 9 (2024) ANA = nl Dr. Elmyra Ricks notes and pulm notes reviewed CT Chest (06/10/2022): No significant nodules though it was a CTA CTH (2019): sinuses clear and without significant disease, L frontal atelectatic PMH/Meds/All/SocHx/FamHx/ROS:   Past Medical History:  Diagnosis Date   Anxiety    takes lexapro   Arthritis    djd   Bipolar 1 disorder (HCC)    not on medications at this time   Carpal tunnel syndrome, bilateral    Chronic back pain    Headache(784.0)    history of migraines related to stress   PONV (postoperative nausea and vomiting)      Past Surgical History:  Procedure Laterality Date   ABDOMINAL HYSTERECTOMY  2007   ANTERIOR CRUCIATE LIGAMENT REPAIR     CARPAL TUNNEL RELEASE  01/24/2012   Procedure: CARPAL TUNNEL RELEASE;  Surgeon: [name], MD;  Location: [facility];  Service: Orthopedics;  Laterality: Left;   CERVIX SURGERY  1990   CESAREAN SECTION  1995   1989   FOOT SURGERY Right 2015   KNEE ARTHROSCOPY Right 1991   and [id number]   TOTAL HIP ARTHROPLASTY  01/18/2011   Procedure: TOTAL HIP ARTHROPLASTY;  Surgeon: [name];  Location: [facility];  Service: Orthopedics;  Laterality: Right;  DEPUY/PINNICAL POLY   TOTAL HIP ARTHROPLASTY Left 10/05/2021   Procedure: LEFT TOTAL HIP ARTHROPLASTY ANTERIOR APPROACH;  Surgeon: Gean Birchwood, MD;  Location: [facility];  Service: Orthopedics;  Laterality: Left;   TUBAL LIGATION  1996    Family History  Problem Relation Age of Onset   Alcohol abuse Mother    Depression Mother    Alcohol abuse Father    Depression Sister    Alcohol abuse Sister    Depression Paternal Grandmother    Drug abuse Son    Alcohol abuse Son    Bipolar disorder Son      Social Connections: Somewhat Isolated (09/20/2022)   Received from [facility]   Social Network    How would you rate your social network (family, work, friends)?: Restricted participation with some degree of social isolation       Current Outpatient Medications:    albuterol (PROAIR HFA) 108 (90 Base) MCG/ACT inhaler, Inhale 2 puffs into the lungs daily as needed for shortness of breath., Disp: , Rfl:    atorvastatin (LIPITOR) 20 MG tablet, Take 20 mg by mouth daily., Disp: , Rfl:    azelastine (ASTELIN) 0.1 % nasal spray, Place 1-2 sprays into both nostrils 2 (two) times daily as needed (nasal drainage). Use in each nostril as directed, Disp: 30 mL, Rfl: 3   baclofen (LIORESAL) 10 MG tablet, Take 10 mg by mouth 3 (three) times daily., Disp: , Rfl:    BREZTRI AEROSPHERE 160-9-4.8 MCG/ACT AERO, Inhale 2 puffs into the lungs 2 (two) times daily., Disp: , Rfl:    Cholecalciferol 1.25 MG (50000 UT) TABS, Take 1 tablet by mouth once a week., Disp: , Rfl:    clindamycin (CLINDAGEL) 1 % gel, Apply topically 2 (two) times daily., Disp: 30 g, Rfl: 0   fluticasone (FLONASE) 50 MCG/ACT nasal spray, Place 1 spray into both nostrils 2 (two) times daily as needed (nasal congestion)., Disp: 16 g, Rfl: 3   gabapentin (NEURONTIN) 100 MG capsule, Take 1 capsule (100 mg total) by mouth 3 (three) times daily., Disp: 90 capsule, Rfl: 3   levocetirizine (XYZAL) 5 MG tablet, Take 5 mg by mouth daily., Disp: , Rfl:    meloxicam (MOBIC) 15 MG tablet, Take 15 mg by mouth daily., Disp: , Rfl:    mirtazapine (REMERON SOL-TAB) 30 MG disintegrating tablet, Take by mouth., Disp: , Rfl:    montelukast (SINGULAIR) 10 MG tablet, Take 10 mg by mouth daily., Disp: , Rfl:    omeprazole (PRILOSEC) 20 MG capsule, Take by mouth., Disp: , Rfl:    phentermine 37.5 MG capsule, Take 37.5 mg by mouth every morning., Disp: , Rfl:    Physical Exam:   Ht 5\' 9"  (1.753 m)   Wt 245 lb (111.1 kg)   BMI 36.18 kg/m   Salient findings:  CN II-XII intact  Bilateral EAC clear and TM intact with well pneumatized middle ear spaces Anterior rhinoscopy: Septum relatively midline; some thick secretions; bilateral inferior turbinates without significant hypertrophy No  lesions of oral cavity/oropharynx No obviously palpable neck masses/lymphadenopathy/thyromegaly No respiratory distress or stridor; voice quality harsh - class III, intermittent dry cough and some throat clearing  Seprately Identifiable Procedures:  Procedure Note Pre-procedure diagnosis:  Dysphonia, throat clearing, chronic cough Post-procedure diagnosis: Same Procedure: Transnasal Fiberoptic Laryngoscopy, CPT 27062 - Mod 25 Indication: Dysphonia, throat clearing, chronic cough Complications: None apparent EBL: 0 mL Date: 11/22/22   The procedure was undertaken to further evaluate the patient's complaint of Dysphonia, throat clearing, chronic cough, with mirror exam inadequate for appropriate examination due to gag reflex and poor patient tolerance  Procedure:  Patient was identified as correct patient. Verbal consent was obtained. The nose was sprayed with oxymetazoline and  4% lidocaine. The The flexible laryngoscope was passed through the nose to view the nasal cavity, pharynx (oropharynx, hypopharynx) and larynx.  The larynx was examined at rest and during multiple phonatory tasks. Documentation was obtained and reviewed with patient. The scope was removed. The patient tolerated the procedure well.  Findings: The nasal cavity and nasopharynx did not reveal any masses or lesions, mucosa appeared to be without obvious lesions. The tongue base, pharyngeal walls, piriform sinuses, vallecula, epiglottis and postcricoid region are normal in appearance without significant retained secretions. The visualized portion of the subglottis and proximal trachea is widely patent. The vocal folds are mobile bilaterally. There are no lesions on the free edge of the vocal folds nor elsewhere in the larynx worrisome for malignancy. 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 female with history of smoking, OSA, Allergic rhinitis now with: 1. Chronic cough   2. Chronic throat clearing   3. Dysphonia   4. Muscle tension dysphonia    Her workup so far with Pulm and Allergy has been largely unrevealing. This has persisted despite multiple rounds of abx, inhalers, PO steroids, nasal sprays and GERD treatment. TFL is reassuring and I suspect that she likely has upper airway cough syndrome. We discussed the pathophysiology of this, and treatment. I expect she will do well with a relatively conservative course of treatment - Recommend continuing current inhalers and nasal sprays per Allergy and Pulm - Refer to Voice therapy for throat clearing and cough suppression strategies (discussed with her "smell the flowers, blow the candle" and sipping water strategy) - Discussed R/B/A for Gabapentin and side effects and she would like to start it. Will initiate on 100mg  TID and can uptitrate. Discontinue if interacts with baclofen or does not do well  - f/u 3 months  See below regarding exact medications prescribed this encounter including dosages and route: Meds ordered this encounter  Medications   gabapentin (NEURONTIN) 100 MG capsule    Sig: Take 1 capsule (100 mg total) by mouth 3 (three) times daily.    Dispense:  90 capsule    Refill:  3      Thank you for allowing me the opportunity to care for your patient. Please do not hesitate to contact me should you have any other questions.  Sincerely, Jovita Kussmaul, MD Otolarynoglogist (ENT), [facility] Phone: [phone] Fax: [phone]  11/22/2022, 5:36 PM   MDM:  Level 4 Complexity/Problems addressed: mod - chronic problem, exacerbation Data complexity: mod - independent review of imaging, labs, notes - Morbidity: mod  - Prescription Drug prescribed or managed: yes

## 2022-11-22 NOTE — Telephone Encounter (Signed)
Pt came to office and needed to go to new ent office, I gave her the new address, I could not find where pt was notified of the new Address, pt uses transportation provided by her insurance

## 2022-12-07 ENCOUNTER — Encounter: Payer: Self-pay | Admitting: Allergy

## 2022-12-07 ENCOUNTER — Ambulatory Visit: Payer: 59 | Admitting: Allergy

## 2022-12-07 VITALS — BP 128/90 | HR 75 | Temp 98.2°F | Resp 18

## 2022-12-07 DIAGNOSIS — B999 Unspecified infectious disease: Secondary | ICD-10-CM | POA: Diagnosis not present

## 2022-12-07 DIAGNOSIS — J310 Chronic rhinitis: Secondary | ICD-10-CM

## 2022-12-07 DIAGNOSIS — R053 Chronic cough: Secondary | ICD-10-CM

## 2022-12-07 DIAGNOSIS — K219 Gastro-esophageal reflux disease without esophagitis: Secondary | ICD-10-CM

## 2022-12-07 NOTE — Progress Notes (Unsigned)
Follow Up Note  RE: Tiffany Romero MRN: [id number] DOB: 04/03/1970 Date of Office Visit: 12/07/2022  Referring provider: Vivien Presto, MD Primary care provider: Elder Negus, NP  Chief Complaint: Cough  History of Present Illness: I had the pleasure of seeing Tiffany Romero for a follow up visit at the [facility] on 12/09/2022. She is a 52 y.o. female, who is being followed for chronic cough, GERD, recurrent infections, chronic rhinitis. Her previous allergy office visit was on 09/07/2022 with Dr. Selena Batten. Today is a regular follow up visit.  Discussed the use of AI scribe software for clinical note transcription with the patient, who gave verbal consent to proceed.  The patient presents with complaints of wheezing and coughing, particularly when breathing out. The symptoms are more pronounced when lying down and often lead to a 'whistling' sound. The patient reports that the wheezing and tightness in the chest seem to trigger the coughing. She has recently undergone a laryngoscopy, which did not reveal any masses. The patient has been started on gabapentin which seems to be providing some relief.   The patient has a history of DUI due to narcotics use, not alcohol per patient report, and is currently navigating the legal requirements for an ignition interlock device for her vehicle. She has ceased smoking since the beginning of the year, but is exposed to secondhand smoke from a frequent companion who smokes heavily.  The patient is currently on Breztri, two puffs twice a day, and albuterol as needed, which is not required daily. She also takes medication for heartburn daily, which seems to be effective,. The patient also uses Flonase and Astelin nasal sprays twice a day, two sprays per nostril. She has recently received a pneumonia shot but did not get bloodwork to see if titers improved.      11/22/2022 ENT visit: " 1. Chronic cough   2. Chronic throat clearing    3. Dysphonia   4. Muscle tension dysphonia     Her workup so far with Pulm and Allergy has been largely unrevealing. This has persisted despite multiple rounds of abx, inhalers, PO steroids, nasal sprays and GERD treatment. TFL is reassuring and I suspect that she likely has upper airway cough syndrome. We discussed the pathophysiology of this, and treatment. I expect she will do well with a relatively conservative course of treatment - Recommend continuing current inhalers and nasal sprays per Allergy and Pulm - Refer to Voice therapy for throat clearing and cough suppression strategies (discussed with her "smell the flowers, blow the candle" and sipping water strategy) - Discussed R/B/A for Gabapentin and side effects and she would like to start it. Will initiate on 100mg  TID and can uptitrate. Discontinue if interacts with baclofen or does not do well"  12/01/2022 pulm visit: "Impression  1. Chronic cough  2. OSA on CPAP  3. Non-seasonal allergic rhinitis due to other allergic trigger  4. Encounter for screening for lung cancer  5. Immunization deficiency  6. OSA (obstructive sleep apnea)   Plan  Cough: chronic in nature, normal PFTs. Ddx of PND or GERD and smoking history, improving - Continue prilosec - Continue flonase and astelin - DCed trelegy - Continue breztri to 2 puffs BID, rinse mouth after use to avoid thrush, discussed at length - PRN albuterol, patient to quantify use and efficacy, doubt change to nebulizer therapy will be necessary - Elevate the head of the bed discussed - Avoid eating closer to bed time  discussed - Follow with ENT  OSA on CPAP: - F/U with Dr. Jerre Simon for sleep medicine, to see again in 11/2022  Allergic rhinitis: - Continue flonase and astelin  Screening CT: quit smoking 2023 - Screening CT ordered 06/2023  Immunization deficiency: - Flu will get from primary - PCV20, dispensed 09/20/2022"  11/22/2022 ENT visit: "Tiffany Romero is a 52 y.o.  female with history of smoking, OSA, Allergic rhinitis now with: 1. Chronic cough   2. Chronic throat clearing   3. Dysphonia   4. Muscle tension dysphonia     Her workup so far with Pulm and Allergy has been largely unrevealing. This has persisted despite multiple rounds of abx, inhalers, PO steroids, nasal sprays and GERD treatment. TFL is reassuring and I suspect that she likely has upper airway cough syndrome. We discussed the pathophysiology of this, and treatment. I expect she will do well with a relatively conservative course of treatment - Recommend continuing current inhalers and nasal sprays per Allergy and Pulm - Refer to Voice therapy for throat clearing and cough suppression strategies (discussed with her "smell the flowers, blow the candle" and sipping water strategy) - Discussed R/B/A for Gabapentin and side effects and she would like to start it. Will initiate on 100mg  TID and can uptitrate. Discontinue if interacts with baclofen or does not do well"  Assessment and Plan: [name] is a 52 y.o. female with: Chronic cough Past history - Daily coughing with post tussive emesis at times for 1+ year. Currently on Trelegy and still using albuterol neb every 6 hours x 1 month with good benefit. 4-5 courses of prednisone. Followed by pulm. Had cardiac eval while hospitalized - echo, CT chest unremarkable. Spiro normal. Multiple courses of antibiotics as well.  Interim history - saw ENT and pulm recently. Continue inhalers as per pulmonology. Daily controller medication(s): continue Breztri 2 puffs twice a day with spacer and rinse mouth afterwards. May use albuterol rescue inhaler 2 puffs or nebulizer every 4 to 6 hours as needed for shortness of breath, chest tightness, coughing, and wheezing.   Gastroesophageal reflux disease Controlled.  Continue lifestyle and dietary modifications. Continue Prilosec 20mg  once day - nothing to eat or drink for 20-30 minutes afterwards.     Recurrent infections Past history - Pneumonias and bronchitis in the past. 2024 bloodwork normal immunoglobulin levels, poor pneumococcal titers. Interim history -  received pneumonia shot.  Keep track of infections and antibiotics use. Get bloodwork to look at titers.   Chronic rhinitis Past history - Rhinitis symptoms mainly in the spring. Tried zyrtec, Xyzal, Singulair and Flonase recently with minimal benefit. 2024 bloodwork negative environmental panel but IgE was in the 800s. Interim history - stable.  Use azelastine nasal spray 1-2 sprays per nostril twice a day as needed for runny nose/drainage. Use Flonase (fluticasone) nasal spray 1 spray per nostril twice a day as needed for nasal congestion.  Nasal saline spray (i.e., Simply Saline) or nasal saline lavage (i.e., NeilMed) is recommended as needed and prior to medicated nasal sprays. Use over the counter antihistamines such as Zyrtec (cetirizine), Claritin (loratadine), Allegra (fexofenadine), or Xyzal (levocetirizine) daily as needed.    Other  Patient brought in a form from [facility] regarding ignition interlock medical accomodation form. At the time of the visit, I advised patient that I'll have to look into this matter prior to signing any documents due to my unfamiliarity with such form.  Discussed case with her pulmonologist and given recent spirometry test results  and medical history I am unable to sign the form. Patient will be notified regarding this via mychart message.   Return if symptoms worsen or fail to improve.  No orders of the defined types were placed in this encounter.  Lab Orders         Comprehensive metabolic panel         Strep pneumoniae 23 Serotypes IgG     Diagnostics: None.   Medication List:  Current Outpatient Medications  Medication Sig Dispense Refill   albuterol (PROAIR HFA) 108 (90 Base) MCG/ACT inhaler Inhale 2 puffs into the lungs daily as needed for shortness of breath.     azelastine  (ASTELIN) 0.1 % nasal spray Place 1-2 sprays into both nostrils 2 (two) times daily as needed (nasal drainage). Use in each nostril as directed 30 mL 3   baclofen (LIORESAL) 10 MG tablet Take 10 mg by mouth 3 (three) times daily.     BREZTRI AEROSPHERE 160-9-4.8 MCG/ACT AERO Inhale 2 puffs into the lungs 2 (two) times daily.     Cholecalciferol 1.25 MG (50000 UT) TABS Take 1 tablet by mouth once a week.     clindamycin (CLINDAGEL) 1 % gel Apply topically 2 (two) times daily. 30 g 0   fluticasone (FLONASE) 50 MCG/ACT nasal spray Place 1 spray into both nostrils 2 (two) times daily as needed (nasal congestion). 16 g 3   gabapentin (NEURONTIN) 100 MG capsule Take 1 capsule (100 mg total) by mouth 3 (three) times daily. 90 capsule 3   levocetirizine (XYZAL) 5 MG tablet Take 5 mg by mouth daily.     meloxicam (MOBIC) 15 MG tablet Take 15 mg by mouth daily.     mirtazapine (REMERON SOL-TAB) 30 MG disintegrating tablet Take by mouth.     omeprazole (PRILOSEC) 20 MG capsule Take by mouth.     phentermine 37.5 MG capsule Take 37.5 mg by mouth every morning.     atorvastatin (LIPITOR) 20 MG tablet Take 20 mg by mouth daily.     No current facility-administered medications for this visit.   Allergies: Allergies  Allergen Reactions   Cephalexin Hives   Coumadin [Warfarin Sodium] Swelling   Trazodone And Nefazodone Other (See Comments)    migraine   I reviewed her past medical history, social history, family history, and environmental history and no significant changes have been reported from her previous visit.  Review of Systems  Constitutional:  Negative for appetite change, chills, fever and unexpected weight change.  HENT:  Negative for congestion and rhinorrhea.   Eyes:  Negative for itching.  Respiratory:  Positive for cough. Negative for wheezing.   Cardiovascular:  Negative for chest pain.  Gastrointestinal:  Negative for abdominal pain.  Genitourinary:  Negative for difficulty  urinating.  Skin:  Negative for rash.  Neurological:  Positive for headaches.    Objective: BP (!) 128/90   Pulse 75   Temp 98.2 F (36.8 C) (Temporal)   Resp 18   SpO2 96%  There is no height or weight on file to calculate BMI. Physical Exam Vitals and nursing note reviewed.  Constitutional:      Appearance: Normal appearance. She is well-developed.  HENT:     Head: Normocephalic and atraumatic.     Right Ear: Tympanic membrane and external ear normal.     Left Ear: Tympanic membrane and external ear normal.     Nose: Nose normal.     Mouth/Throat:     Mouth: Mucous membranes  are moist.     Pharynx: Oropharynx is clear.  Eyes:     Conjunctiva/sclera: Conjunctivae normal.  Cardiovascular:     Rate and Rhythm: Normal rate and regular rhythm.     Heart sounds: Normal heart sounds. No murmur heard.    No friction rub. No gallop.  Pulmonary:     Effort: Pulmonary effort is normal.     Breath sounds: Normal breath sounds. No wheezing, rhonchi or rales.  Musculoskeletal:     Cervical back: Neck supple.  Skin:    General: Skin is warm.     Findings: No rash.  Neurological:     Mental Status: She is alert and oriented to person, place, and time.  Psychiatric:        Behavior: Behavior normal.    Previous notes and tests were reviewed. The plan was reviewed with the patient/family, and all questions/concerned were addressed.  It was my pleasure to see Tanicka today and participate in her care. Please feel free to contact me with any questions or concerns.  Sincerely,  Wyline Mood, DO Allergy & Immunology  [facility] office: [phone] [facility] office: [phone]

## 2022-12-07 NOTE — Patient Instructions (Addendum)
I will get back to you about the forms that you gave me.  I need to review them and reach out to your pulmonologist and PCP first.   Chronic cough Use azelastine nasal spray 1-2 sprays per nostril twice a day as needed for runny nose/drainage. Use Flonase (fluticasone) nasal spray 1 spray per nostril twice a day as needed for nasal congestion.  Nasal saline spray (i.e., Simply Saline) or nasal saline lavage (i.e., NeilMed) is recommended as needed and prior to medicated nasal sprays. Use over the counter antihistamines such as Zyrtec (cetirizine), Claritin (loratadine), Allegra (fexofenadine), or Xyzal (levocetirizine) daily as needed.   Continue inhalers as per pulmonology. Daily controller medication(s): continue Breztri 2 puffs twice a day with spacer and rinse mouth afterwards. May use albuterol rescue inhaler 2 puffs or nebulizer every 4 to 6 hours as needed for shortness of breath, chest tightness, coughing, and wheezing. May use albuterol rescue inhaler 2 puffs 5 to 15 minutes prior to strenuous physical activities. Monitor frequency of use - if you need to use it more than twice per week on a consistent basis let us know.  Breathing control goals:  Full participation in all desired activities (may need albuterol before activity) Albuterol use two times or less a week on average (not counting use with activity) Cough interfering with sleep two times or less a month Oral steroids no more than once a year No hospitalizations   Reflux Continue lifestyle and dietary modifications. Continue Prilosec 20mg  once day - nothing to eat or drink for 20-30 minutes afterwards.   Infections Keep track of infections and antibiotics use. Get bloodwork We are ordering labs, so please allow 1-2 weeks for the results to come back. With the newly implemented Cures Act, the labs might be visible to you at the same time that they become visible to me. However, I will not address the results until all of  the results are back, so please be patient.  In the meantime, continue recommendations in your patient instructions, including avoidance measures (if applicable), until you hear from me.  Follow up if needed.

## 2022-12-09 ENCOUNTER — Encounter: Payer: Self-pay | Admitting: Allergy

## 2022-12-14 ENCOUNTER — Other Ambulatory Visit: Payer: Self-pay | Admitting: Allergy

## 2023-01-18 ENCOUNTER — Telehealth (INDEPENDENT_AMBULATORY_CARE_PROVIDER_SITE_OTHER): Payer: Self-pay | Admitting: Otolaryngology

## 2023-01-18 MED ORDER — GABAPENTIN 100 MG PO CAPS: 100.0000 mg | ORAL_CAPSULE | Freq: Three times a day (TID) | ORAL | 3 refills | Status: DC

## 2023-01-18 NOTE — Telephone Encounter (Signed)
Refilled Gabapentin

## 2023-02-22 ENCOUNTER — Telehealth (INDEPENDENT_AMBULATORY_CARE_PROVIDER_SITE_OTHER): Payer: Self-pay | Admitting: Otolaryngology

## 2023-02-22 ENCOUNTER — Ambulatory Visit (INDEPENDENT_AMBULATORY_CARE_PROVIDER_SITE_OTHER): Payer: 59

## 2023-02-22 NOTE — Telephone Encounter (Signed)
Spoke with patient and confirmed appt and address for 02/23/2023.

## 2023-02-23 ENCOUNTER — Ambulatory Visit (INDEPENDENT_AMBULATORY_CARE_PROVIDER_SITE_OTHER): Payer: 59

## 2023-03-21 ENCOUNTER — Other Ambulatory Visit: Payer: Self-pay | Admitting: Allergy

## 2023-04-18 ENCOUNTER — Other Ambulatory Visit: Payer: Self-pay | Admitting: Allergy

## 2023-06-22 ENCOUNTER — Other Ambulatory Visit (INDEPENDENT_AMBULATORY_CARE_PROVIDER_SITE_OTHER): Payer: Self-pay | Admitting: Otolaryngology

## 2023-07-25 ENCOUNTER — Ambulatory Visit

## 2023-08-10 ENCOUNTER — Emergency Department (HOSPITAL_COMMUNITY)
Admission: EM | Admit: 2023-08-10 | Discharge: 2023-08-11 | Disposition: A | Discharge status: Home / Self Care | Attending: Emergency Medicine | Admitting: Emergency Medicine

## 2023-08-10 ENCOUNTER — Encounter (HOSPITAL_COMMUNITY): Payer: Self-pay

## 2023-08-10 ENCOUNTER — Emergency Department (HOSPITAL_COMMUNITY)

## 2023-08-10 ENCOUNTER — Other Ambulatory Visit: Payer: Self-pay

## 2023-08-10 DIAGNOSIS — K5792 Diverticulitis of intestine, part unspecified, without perforation or abscess without bleeding: Secondary | ICD-10-CM

## 2023-08-10 DIAGNOSIS — K5732 Diverticulitis of large intestine without perforation or abscess without bleeding: Secondary | ICD-10-CM | POA: Diagnosis not present

## 2023-08-10 DIAGNOSIS — Z79899 Other long term (current) drug therapy: Secondary | ICD-10-CM | POA: Diagnosis not present

## 2023-08-10 DIAGNOSIS — R1084 Generalized abdominal pain: Secondary | ICD-10-CM | POA: Diagnosis present

## 2023-08-10 LAB — CBC
HCT: 49 % — ABNORMAL HIGH (ref 36.0–46.0)
Hemoglobin: 16.9 g/dL — ABNORMAL HIGH (ref 12.0–15.0)
MCH: 29.9 pg (ref 26.0–34.0)
MCHC: 34.5 g/dL (ref 30.0–36.0)
MCV: 86.6 fL (ref 80.0–100.0)
Platelets: 241 K/uL (ref 150–400)
RBC: 5.66 MIL/uL — ABNORMAL HIGH (ref 3.87–5.11)
RDW: 14.6 % (ref 11.5–15.5)
WBC: 8.5 K/uL (ref 4.0–10.5)
nRBC: 0 % (ref 0.0–0.2)

## 2023-08-10 LAB — TYPE AND SCREEN
ABO/RH(D): AB NEG
Antibody Screen: NEGATIVE

## 2023-08-10 LAB — COMPREHENSIVE METABOLIC PANEL WITH GFR
ALT: 62 U/L — ABNORMAL HIGH (ref 0–44)
AST: 49 U/L — ABNORMAL HIGH (ref 15–41)
Albumin: 4 g/dL (ref 3.5–5.0)
Alkaline Phosphatase: 93 U/L (ref 38–126)
Anion gap: 12 (ref 5–15)
BUN: 9 mg/dL (ref 6–20)
CO2: 20 mmol/L — ABNORMAL LOW (ref 22–32)
Calcium: 9.3 mg/dL (ref 8.9–10.3)
Chloride: 107 mmol/L (ref 98–111)
Creatinine, Ser: 0.7 mg/dL (ref 0.44–1.00)
GFR, Estimated: >60 mL/min (ref >60)
Glucose, Bld: 123 mg/dL — ABNORMAL HIGH (ref 70–99)
Potassium: 3.8 mmol/L (ref 3.5–5.1)
Sodium: 139 mmol/L (ref 135–145)
Total Bilirubin: 0.9 mg/dL (ref 0.0–1.2)
Total Protein: 7.5 g/dL (ref 6.5–8.1)

## 2023-08-10 LAB — URINALYSIS, MICROSCOPIC (REFLEX): RBC / HPF: NONE SEEN RBC/hpf (ref 0–5)

## 2023-08-10 LAB — LIPASE, BLOOD: Lipase: 34 U/L (ref 11–51)

## 2023-08-10 LAB — URINALYSIS, ROUTINE W REFLEX MICROSCOPIC
Bilirubin Urine: NEGATIVE
Glucose, UA: NEGATIVE mg/dL
Hgb urine dipstick: NEGATIVE
Ketones, ur: NEGATIVE mg/dL
Nitrite: POSITIVE — AB
Protein, ur: 30 mg/dL — AB
Specific Gravity, Urine: 1.015 (ref 1.005–1.030)
pH: 7 (ref 5.0–8.0)

## 2023-08-10 LAB — POC OCCULT BLOOD, ED: Fecal Occult Blood: POSITIVE

## 2023-08-10 MED ORDER — CIPROFLOXACIN HCL 500 MG PO TABS: 500.0000 mg | ORAL_TABLET | Freq: Two times a day (BID) | ORAL | 0 refills | Status: DC

## 2023-08-10 MED ORDER — IOHEXOL 300 MG/ML  SOLN
100.0000 mL | Freq: Once | INTRAMUSCULAR | Status: AC | PRN
  Administered 2023-08-10: 100 mL via INTRAVENOUS

## 2023-08-10 MED ORDER — ONDANSETRON HCL 4 MG/2ML IJ SOLN
4.0000 mg | Freq: Once | INTRAMUSCULAR | Status: AC
  Administered 2023-08-10: 4 mg via INTRAVENOUS
  Filled 2023-08-10: qty 2

## 2023-08-10 MED ORDER — LORAZEPAM 2 MG/ML IJ SOLN
0.5000 mg | Freq: Once | INTRAMUSCULAR | Status: AC
  Administered 2023-08-10: 0.5 mg via INTRAVENOUS
  Filled 2023-08-10: qty 1

## 2023-08-10 MED ORDER — ONDANSETRON HCL 4 MG/2ML IJ SOLN
4.0000 mg | Freq: Once | INTRAMUSCULAR | Status: AC | PRN
  Administered 2023-08-10: 4 mg via INTRAVENOUS
  Filled 2023-08-10: qty 2

## 2023-08-10 MED ORDER — SODIUM CHLORIDE 0.9 % IV BOLUS
1000.0000 mL | Freq: Once | INTRAVENOUS | Status: AC
  Administered 2023-08-10: 1000 mL via INTRAVENOUS

## 2023-08-10 MED ORDER — ONDANSETRON 4 MG PO TBDP: ORAL_TABLET | ORAL | 0 refills | Status: AC

## 2023-08-10 MED ORDER — METRONIDAZOLE 500 MG/100ML IV SOLN
500.0000 mg | Freq: Once | INTRAVENOUS | Status: AC
  Administered 2023-08-10: 500 mg via INTRAVENOUS
  Filled 2023-08-10: qty 100

## 2023-08-10 MED ORDER — METRONIDAZOLE 500 MG PO TABS: 500.0000 mg | ORAL_TABLET | Freq: Four times a day (QID) | ORAL | 0 refills | Status: DC

## 2023-08-10 MED ORDER — CIPROFLOXACIN IN D5W 400 MG/200ML IV SOLN
400.0000 mg | Freq: Once | INTRAVENOUS | Status: AC
  Administered 2023-08-10: 400 mg via INTRAVENOUS
  Filled 2023-08-10: qty 200

## 2023-08-10 MED ORDER — HYDROMORPHONE HCL 1 MG/ML IJ SOLN
1.0000 mg | Freq: Once | INTRAMUSCULAR | Status: AC
  Administered 2023-08-10: 1 mg via INTRAVENOUS
  Filled 2023-08-10: qty 1

## 2023-08-10 MED ORDER — OXYCODONE-ACETAMINOPHEN 5-325 MG PO TABS: 1.0000 | ORAL_TABLET | Freq: Four times a day (QID) | ORAL | 0 refills | Status: DC | PRN

## 2023-08-10 NOTE — Discharge Instructions (Signed)
 Drink plenty of water.  Follow-up with your family doctor next week.  Return if any problem

## 2023-08-10 NOTE — ED Provider Notes (Signed)
 [place] EMERGENCY DEPARTMENT AT [facility] Provider Note   CSN: [id number] Arrival date & time: 08/10/23  8177     Patient presents with: Nausea, Emesis, Rectal Bleeding, and Abdominal Pain   Tiffany Romero is a 53 y.o. female.  {Add pertinent medical, surgical, social history, OB history to [id number]} Patient complains of abdominal pain and some blood in her stool.   Emesis Associated symptoms: abdominal pain   Rectal Bleeding Associated symptoms: abdominal pain and vomiting   Abdominal Pain Associated symptoms: hematochezia and vomiting        Prior to Admission medications   Medication Sig Start Date End Date Taking? Authorizing Provider  ciprofloxacin  (CIPRO ) 500 MG tablet Take 1 tablet (500 mg total) by mouth 2 (two) times daily. One po bid x 7 days 08/10/23  Yes Sana Tessmer, MD  metroNIDAZOLE  (FLAGYL ) 500 MG tablet Take 1 tablet (500 mg total) by mouth 4 (four) times daily. 08/10/23  Yes Kyree Adriano, MD  ondansetron  (ZOFRAN -ODT) 4 MG disintegrating tablet 4mg  ODT q4 hours prn nausea/vomit 08/10/23  Yes Latoya Diskin, MD  oxyCODONE -acetaminophen  (PERCOCET/ROXICET) 5-325 MG tablet Take 1 tablet by mouth every 6 (six) hours as needed for severe pain (pain score 7-10). 08/10/23  Yes Suzette Pac, MD  albuterol  (PROAIR  HFA) 108 (90 Base) MCG/ACT inhaler Inhale 2 puffs into the lungs daily as needed for shortness of breath. 12/15/15   [provider]  atorvastatin  (LIPITOR) 20 MG tablet Take 20 mg by mouth daily. 09/18/21   [provider]  azelastine  (ASTELIN ) 0.1 % nasal spray Place 1-2 sprays into both nostrils 2 (two) times daily as needed (nasal drainage). Use in each nostril as directed 04/18/23   Luke Orlan CHRISTELLA, DO  baclofen (LIORESAL) 10 MG tablet Take 10 mg by mouth 3 (three) times daily.    [provider]  BREZTRI AEROSPHERE 160-9-4.8 MCG/ACT AERO Inhale 2 puffs into the lungs 2 (two) times daily. 08/16/22   [provider]  Cholecalciferol 1.25 MG (50000 UT) TABS Take 1 tablet by mouth once a week. 09/24/22   [provider]  clindamycin  (CLINDAGEL) 1 % gel Apply topically 2 (two) times daily. 06/10/22   Theadore Ozell CHRISTELLA, MD  fluticasone  (FLONASE ) 50 MCG/ACT nasal spray Place 1 spray into both nostrils 2 (two) times daily as needed (nasal congestion). 03/21/23   Luke Orlan CHRISTELLA, DO  gabapentin  (NEURONTIN ) 100 MG capsule Take 1 capsule (100 mg total) by mouth 3 (three) times daily. 06/23/23   Patel, Kunjan B, MD  levocetirizine (XYZAL) 5 MG tablet Take 5 mg by mouth daily. 09/18/21   [provider]  meloxicam (MOBIC) 15 MG tablet Take 15 mg by mouth daily.    [provider]  omeprazole (PRILOSEC) 20 MG capsule Take by mouth. 03/12/22   [provider]  phentermine 37.5 MG capsule Take 37.5 mg by mouth every morning.    [provider]    Allergies: Cephalexin, Coumadin  [warfarin sodium ], and Trazodone and nefazodone    Review of Systems  Gastrointestinal:  Positive for abdominal pain, hematochezia and vomiting.    Updated Vital Signs BP 129/84   Pulse 80   Temp 97.9 F (36.6 C)   Resp 20   Ht 5' 9 (1.753 m)   Wt 131.5 kg   SpO2 95%   BMI 42.83 kg/m   Physical Exam  (all labs ordered are listed, but only abnormal results are displayed) Labs Reviewed  COMPREHENSIVE METABOLIC PANEL WITH GFR -  Abnormal; Notable for the following components:      Result Value   CO2 20 (*)    Glucose, Bld 123 (*)    AST 49 (*)    ALT 62 (*)    All other components within normal limits  CBC - Abnormal; Notable for the following components:   RBC 5.66 (*)    Hemoglobin 16.9 (*)    HCT 49.0 (*)    All other components within normal limits  URINALYSIS, ROUTINE W REFLEX MICROSCOPIC - Abnormal; Notable for the following components:   APPearance CLOUDY (*)    Protein, ur 30 (*)    Nitrite POSITIVE (*)    Leukocytes,Ua TRACE (*)    All other components within normal limits   URINALYSIS, MICROSCOPIC (REFLEX) - Abnormal; Notable for the following components:   Bacteria, UA MANY (*)    All other components within normal limits  URINE CULTURE  LIPASE, BLOOD  RAPID URINE DRUG SCREEN, HOSP PERFORMED  POC OCCULT BLOOD, ED  TYPE AND SCREEN    EKG: EKG Interpretation Date/Time:  Wednesday August 10 2023 18:53:23 EDT Ventricular Rate:  78 PR Interval:  154 QRS Duration:  75 QT Interval:  384 QTC Calculation: 438 R Axis:   64  Text Interpretation: Sinus rhythm Borderline T abnormalities, anterior leads Confirmed by Suzette Pac [phone]) on 08/10/2023 10:45:38 PM  Radiology: CT ABDOMEN PELVIS W CONTRAST Result Date: 08/10/2023 CLINICAL DATA:  Acute nonlocalized abdominal pain. Generalized abdominal pain. Nausea, vomiting, and rectal bleeding today. EXAM: CT ABDOMEN AND PELVIS WITH CONTRAST TECHNIQUE: Multidetector CT imaging of the abdomen and pelvis was performed using the standard protocol following bolus administration of intravenous contrast. RADIATION DOSE REDUCTION: This exam was performed according to the departmental dose-optimization program which includes automated exposure control, adjustment of the mA and/or kV according to patient size and/or use of iterative reconstruction technique. CONTRAST:  OMNIPAQUE  IOHEXOL  300 MG/ML  SOLN COMPARISON:  06/10/2022 FINDINGS: Lower chest: Mild dependent atelectasis in the lung bases. Hepatobiliary: Diffuse fatty infiltration of the liver. No focal lesions. Gallbladder and bile ducts are normal. Pancreas: Unremarkable. No pancreatic ductal dilatation or surrounding inflammatory changes. Spleen: Normal in size without focal abnormality. Adrenals/Urinary Tract: Adrenal glands are unremarkable. Kidneys are normal, without renal calculi, focal lesion, or hydronephrosis. Bladder is unremarkable. Stomach/Bowel: Stomach, small bowel, and colon are mostly decompressed. There is mild infiltration in the pericolonic fat around the  low descending colon. This may represent early changes of acute diverticulitis. Appendix is normal. Vascular/Lymphatic: No significant vascular findings are present. No enlarged abdominal or pelvic lymph nodes. Reproductive: Visualization of the low pelvis is limited due to streak artifact from hip arthroplasties. No pelvic mass identified. Other: No free air or free fluid in the abdomen. Small periumbilical hernia containing fat. Musculoskeletal: Bilateral total hip arthroplasties. Degenerative changes in the spine. No acute bony abnormalities. IMPRESSION: 1. Minimal infiltration in the fat around the low descending colon possibly representing early changes of acute diverticulitis. No abscess. 2. Fatty infiltration of the liver. 3. Small periumbilical hernia containing fat. Electronically Signed   By: Elsie Gravely M.D.   On: 08/10/2023 21:23    {Document cardiac monitor, telemetry assessment procedure when appropriate:32947} Procedures   Medications Ordered in the ED  ciprofloxacin  (CIPRO ) IVPB 400 mg (400 mg Intravenous New Bag/Given 08/10/23 2213)  metroNIDAZOLE  (FLAGYL ) IVPB 500 mg (500 mg Intravenous New Bag/Given 08/10/23 2212)  ondansetron  (ZOFRAN ) injection 4 mg (4 mg Intravenous Given 08/10/23 2004)  sodium chloride  0.9 %  bolus 1,000 mL (0 mLs Intravenous Stopped 08/10/23 2213)  ondansetron  (ZOFRAN ) injection 4 mg (4 mg Intravenous Given 08/10/23 2050)  HYDROmorphone  (DILAUDID ) injection 1 mg (1 mg Intravenous Given 08/10/23 2050)  LORazepam  (ATIVAN ) injection 0.5 mg (0.5 mg Intravenous Given 08/10/23 2049)  iohexol  (OMNIPAQUE ) 300 MG/ML solution 100 mL (100 mLs Intravenous Contrast Given 08/10/23 2107)      {Click here for ABCD2, HEART and other calculators REFRESH Note before signing:1}                              Medical Decision Making Amount and/or Complexity of Data Reviewed Labs: ordered. Radiology: ordered.  Risk Prescription drug management.  Patient with diverticulitis and  urinary tract infection.  She is placed on Cipro  and Flagyl  and will follow-up with PCP  {Document critical care time when appropriate  Document review of labs and clinical decision tools ie [id number], etc  Document your independent review of radiology images and any outside records  Document your discussion with family members, caretakers and with consultants  Document social determinants of health affecting pt's care  Document your decision making why or why not admission, treatments were needed:32947:::1}   Final diagnoses:  Diverticulitis    ED Discharge Orders          Ordered    ciprofloxacin  (CIPRO ) 500 MG tablet  2 times daily        08/10/23 2254    metroNIDAZOLE  (FLAGYL ) 500 MG tablet  4 times daily        08/10/23 2254    ondansetron  (ZOFRAN -ODT) 4 MG disintegrating tablet        08/10/23 2254    oxyCODONE -acetaminophen  (PERCOCET/ROXICET) 5-325 MG tablet  Every 6 hours PRN        08/10/23 2254

## 2023-08-10 NOTE — ED Notes (Signed)
 Pt up to bedside commode, pt had moderate amount of dark stool.

## 2023-08-10 NOTE — ED Notes (Signed)
 Lab called. Need more urine for UDS sample

## 2023-08-10 NOTE — ED Triage Notes (Signed)
 Pt c/o abd pian. N/v and rectal bleeding since this morning.

## 2023-08-11 LAB — RAPID URINE DRUG SCREEN, HOSP PERFORMED
Amphetamines: NOT DETECTED
Barbiturates: NOT DETECTED
Benzodiazepines: POSITIVE — AB
Cocaine: NOT DETECTED
Opiates: POSITIVE — AB
Tetrahydrocannabinol: POSITIVE — AB

## 2023-08-13 LAB — URINE CULTURE: Culture: 10000 — AB

## 2023-08-14 ENCOUNTER — Telehealth (HOSPITAL_BASED_OUTPATIENT_CLINIC_OR_DEPARTMENT_OTHER): Payer: Self-pay | Admitting: *Deleted

## 2023-08-14 NOTE — Telephone Encounter (Signed)
 Post ED Visit - Positive Culture Follow-up  Culture report reviewed by antimicrobial stewardship pharmacist: [facility] []  Rankin Dee, Pharm.D. []  Venetia Gully, [address].D., BCPS AQ-ID []  Garrel Crews, Pharm.D., BCPS []  Almarie Lunger, Pharm.D., BCPS []  [place], [place].D., BCPS, AAHIVP []  Rosaline Bihari, Pharm.D., BCPS, AAHIVP []  Vernell Meier, PharmD, BCPS []  Latanya Hint, PharmD, BCPS []  Donald Medley, PharmD, BCPS []  Rocky Bold, PharmD []  Dorothyann Alert, PharmD, BCPS [x] Dorn Buttner, PharmD  [facility] []  Rosaline Edison, PharmD []  Romona Bliss, PharmD []  Dolphus Roller, PharmD []  Veva Seip, Rph []  Vernell Daunt) Leonce, PharmD []  Eva Allis, PharmD []  Rosaline Millet, PharmD []  Iantha Batch, PharmD []  Arvin Gauss, PharmD []  Wanda Hasting, PharmD []  Ronal Rav, PharmD []  Rocky Slade, PharmD []  Bard Jeans, PharmD   Positive urine culture Treated with Ciprofloxacin , organism sensitive to the same and no further patient follow-up is required at this time.  Albino Alan Novak 08/14/2023, 1:35 PM

## 2024-01-22 ENCOUNTER — Inpatient Hospital Stay (HOSPITAL_COMMUNITY)
Admission: EM | Admit: 2024-01-22 | Discharge: 2024-01-24 | DRG: 372 | Disposition: A | Discharge status: Home / Self Care | Attending: Internal Medicine | Admitting: Internal Medicine

## 2024-01-22 ENCOUNTER — Emergency Department (HOSPITAL_COMMUNITY)

## 2024-01-22 ENCOUNTER — Other Ambulatory Visit: Payer: Self-pay

## 2024-01-22 ENCOUNTER — Encounter (HOSPITAL_COMMUNITY): Payer: Self-pay | Admitting: Emergency Medicine

## 2024-01-22 DIAGNOSIS — Z818 Family history of other mental and behavioral disorders: Secondary | ICD-10-CM

## 2024-01-22 DIAGNOSIS — K52839 Microscopic colitis, unspecified: Secondary | ICD-10-CM | POA: Diagnosis present

## 2024-01-22 DIAGNOSIS — J449 Chronic obstructive pulmonary disease, unspecified: Secondary | ICD-10-CM | POA: Diagnosis present

## 2024-01-22 DIAGNOSIS — E876 Hypokalemia: Secondary | ICD-10-CM | POA: Diagnosis present

## 2024-01-22 DIAGNOSIS — Z96642 Presence of left artificial hip joint: Secondary | ICD-10-CM | POA: Diagnosis present

## 2024-01-22 DIAGNOSIS — Z1152 Encounter for screening for COVID-19: Secondary | ICD-10-CM

## 2024-01-22 DIAGNOSIS — Z79899 Other long term (current) drug therapy: Secondary | ICD-10-CM

## 2024-01-22 DIAGNOSIS — K760 Fatty (change of) liver, not elsewhere classified: Secondary | ICD-10-CM | POA: Diagnosis present

## 2024-01-22 DIAGNOSIS — R112 Nausea with vomiting, unspecified: Secondary | ICD-10-CM | POA: Diagnosis not present

## 2024-01-22 DIAGNOSIS — Z90710 Acquired absence of both cervix and uterus: Secondary | ICD-10-CM

## 2024-01-22 DIAGNOSIS — Z888 Allergy status to other drugs, medicaments and biological substances status: Secondary | ICD-10-CM

## 2024-01-22 DIAGNOSIS — F319 Bipolar disorder, unspecified: Secondary | ICD-10-CM | POA: Diagnosis present

## 2024-01-22 DIAGNOSIS — E785 Hyperlipidemia, unspecified: Secondary | ICD-10-CM | POA: Diagnosis present

## 2024-01-22 DIAGNOSIS — Z881 Allergy status to other antibiotic agents status: Secondary | ICD-10-CM

## 2024-01-22 DIAGNOSIS — F17210 Nicotine dependence, cigarettes, uncomplicated: Secondary | ICD-10-CM | POA: Diagnosis present

## 2024-01-22 DIAGNOSIS — A0472 Enterocolitis due to Clostridium difficile, not specified as recurrent: Principal | ICD-10-CM | POA: Diagnosis present

## 2024-01-22 DIAGNOSIS — G8929 Other chronic pain: Secondary | ICD-10-CM | POA: Diagnosis present

## 2024-01-22 DIAGNOSIS — Z791 Long term (current) use of non-steroidal anti-inflammatories (NSAID): Secondary | ICD-10-CM

## 2024-01-22 DIAGNOSIS — E872 Acidosis, unspecified: Secondary | ICD-10-CM | POA: Diagnosis present

## 2024-01-22 LAB — CBC WITH DIFFERENTIAL/PLATELET
Abs Immature Granulocytes: 0.04 K/uL (ref 0.00–0.07)
Basophils Absolute: 0.1 K/uL (ref 0.0–0.1)
Basophils Relative: 1 %
Eosinophils Absolute: 0 K/uL (ref 0.0–0.5)
Eosinophils Relative: 0 %
HCT: 46.6 % — ABNORMAL HIGH (ref 36.0–46.0)
Hemoglobin: 16.1 g/dL — ABNORMAL HIGH (ref 12.0–15.0)
Immature Granulocytes: 0 %
Lymphocytes Relative: 8 %
Lymphs Abs: 0.8 K/uL (ref 0.7–4.0)
MCH: 29.6 pg (ref 26.0–34.0)
MCHC: 34.5 g/dL (ref 30.0–36.0)
MCV: 85.7 fL (ref 80.0–100.0)
Monocytes Absolute: 0.8 K/uL (ref 0.1–1.0)
Monocytes Relative: 7 %
Neutro Abs: 9.4 K/uL — ABNORMAL HIGH (ref 1.7–7.7)
Neutrophils Relative %: 84 %
Platelets: 215 K/uL (ref 150–400)
RBC: 5.44 MIL/uL — ABNORMAL HIGH (ref 3.87–5.11)
RDW: 14.1 % (ref 11.5–15.5)
WBC: 11.1 K/uL — ABNORMAL HIGH (ref 4.0–10.5)
nRBC: 0 % (ref 0.0–0.2)

## 2024-01-22 LAB — MAGNESIUM: Magnesium: 1.1 mg/dL — ABNORMAL LOW (ref 1.7–2.4)

## 2024-01-22 LAB — RESP PANEL BY RT-PCR (RSV, FLU A&B, COVID)  RVPGX2
Influenza A by PCR: NEGATIVE
Influenza B by PCR: NEGATIVE
Resp Syncytial Virus by PCR: NEGATIVE
SARS Coronavirus 2 by RT PCR: NEGATIVE

## 2024-01-22 LAB — URINALYSIS, ROUTINE W REFLEX MICROSCOPIC
Bilirubin Urine: NEGATIVE
Glucose, UA: NEGATIVE mg/dL
Hgb urine dipstick: NEGATIVE
Ketones, ur: NEGATIVE mg/dL
Leukocytes,Ua: NEGATIVE
Nitrite: NEGATIVE
Protein, ur: NEGATIVE mg/dL
Specific Gravity, Urine: 1.04 — ABNORMAL HIGH (ref 1.005–1.030)
pH: 7 (ref 5.0–8.0)

## 2024-01-22 LAB — COMPREHENSIVE METABOLIC PANEL WITH GFR
ALT: 23 U/L (ref 0–44)
AST: 19 U/L (ref 15–41)
Albumin: 3.5 g/dL (ref 3.5–5.0)
Alkaline Phosphatase: 64 U/L (ref 38–126)
Anion gap: 13 (ref 5–15)
BUN: 5 mg/dL — ABNORMAL LOW (ref 6–20)
CO2: 18 mmol/L — ABNORMAL LOW (ref 22–32)
Calcium: 6.8 mg/dL — ABNORMAL LOW (ref 8.9–10.3)
Chloride: 112 mmol/L — ABNORMAL HIGH (ref 98–111)
Creatinine, Ser: 0.5 mg/dL (ref 0.44–1.00)
GFR, Estimated: >60 mL/min
Glucose, Bld: 78 mg/dL (ref 70–99)
Potassium: 2.8 mmol/L — ABNORMAL LOW (ref 3.5–5.1)
Sodium: 142 mmol/L (ref 135–145)
Total Bilirubin: 0.6 mg/dL (ref 0.0–1.2)
Total Protein: 5.2 g/dL — ABNORMAL LOW (ref 6.5–8.1)

## 2024-01-22 LAB — LACTIC ACID, PLASMA
Lactic Acid, Venous: 2.7 mmol/L (ref 0.5–1.9)
Lactic Acid, Venous: 1.2 mmol/L (ref 0.5–1.9)

## 2024-01-22 LAB — LIPASE, BLOOD: Lipase: 16 U/L (ref 11–51)

## 2024-01-22 MED ORDER — MELATONIN 5 MG PO TABS
5.0000 mg | ORAL_TABLET | Freq: Once | ORAL | Status: DC
  Filled 2024-01-22: qty 1

## 2024-01-22 MED ORDER — BUSPIRONE HCL 5 MG PO TABS
15.0000 mg | ORAL_TABLET | Freq: Two times a day (BID) | ORAL | Status: DC
  Administered 2024-01-22 – 2024-01-24 (×4): 15 mg via ORAL
  Filled 2024-01-22 (×4): qty 3

## 2024-01-22 MED ORDER — BACLOFEN 10 MG PO TABS
10.0000 mg | ORAL_TABLET | Freq: Three times a day (TID) | ORAL | Status: DC
  Administered 2024-01-22 – 2024-01-24 (×5): 10 mg via ORAL
  Filled 2024-01-22 (×5): qty 1

## 2024-01-22 MED ORDER — SODIUM CHLORIDE 0.9 % IV BOLUS
1000.0000 mL | Freq: Once | INTRAVENOUS | Status: AC
  Administered 2024-01-22: 1000 mL via INTRAVENOUS

## 2024-01-22 MED ORDER — GABAPENTIN 400 MG PO CAPS
400.0000 mg | ORAL_CAPSULE | Freq: Three times a day (TID) | ORAL | Status: DC
  Administered 2024-01-22 – 2024-01-24 (×5): 400 mg via ORAL
  Filled 2024-01-22 (×5): qty 1

## 2024-01-22 MED ORDER — ALBUTEROL SULFATE HFA 108 (90 BASE) MCG/ACT IN AERS: 2.0000 | INHALATION_SPRAY | Freq: Every day | RESPIRATORY_TRACT | Status: DC | PRN

## 2024-01-22 MED ORDER — POTASSIUM CHLORIDE 10 MEQ/100ML IV SOLN
10.0000 meq | INTRAVENOUS | Status: AC
  Administered 2024-01-22 (×4): 10 meq via INTRAVENOUS
  Filled 2024-01-22 (×4): qty 100

## 2024-01-22 MED ORDER — LORATADINE 10 MG PO TABS
10.0000 mg | ORAL_TABLET | Freq: Every day | ORAL | Status: DC
  Administered 2024-01-23 – 2024-01-24 (×2): 10 mg via ORAL
  Filled 2024-01-22 (×2): qty 1

## 2024-01-22 MED ORDER — BUDESONIDE 3 MG PO CPEP
9.0000 mg | ORAL_CAPSULE | Freq: Every day | ORAL | Status: DC
  Administered 2024-01-23 – 2024-01-24 (×2): 9 mg via ORAL
  Filled 2024-01-22 (×2): qty 3

## 2024-01-22 MED ORDER — METOCLOPRAMIDE HCL 5 MG/ML IJ SOLN
10.0000 mg | Freq: Once | INTRAMUSCULAR | Status: AC
  Administered 2024-01-22: 10 mg via INTRAVENOUS
  Filled 2024-01-22: qty 2

## 2024-01-22 MED ORDER — ONDANSETRON 4 MG PO TBDP
4.0000 mg | ORAL_TABLET | Freq: Three times a day (TID) | ORAL | Status: DC | PRN
  Administered 2024-01-23 – 2024-01-24 (×2): 4 mg via ORAL
  Filled 2024-01-22 (×2): qty 1

## 2024-01-22 MED ORDER — MELATONIN 3 MG PO TABS
6.0000 mg | ORAL_TABLET | Freq: Once | ORAL | Status: AC
  Administered 2024-01-22: 6 mg via ORAL

## 2024-01-22 MED ORDER — HYDROMORPHONE HCL 1 MG/ML IJ SOLN
1.0000 mg | Freq: Once | INTRAMUSCULAR | Status: AC
  Administered 2024-01-22: 1 mg via INTRAVENOUS
  Filled 2024-01-22: qty 1

## 2024-01-22 MED ORDER — ALBUTEROL SULFATE (2.5 MG/3ML) 0.083% IN NEBU: 2.5000 mg | INHALATION_SOLUTION | Freq: Every day | RESPIRATORY_TRACT | Status: DC | PRN

## 2024-01-22 MED ORDER — LEVOCETIRIZINE DIHYDROCHLORIDE 5 MG PO TABS: 5.0000 mg | ORAL_TABLET | Freq: Every day | ORAL | Status: DC

## 2024-01-22 MED ORDER — POTASSIUM CHLORIDE CRYS ER 20 MEQ PO TBCR
40.0000 meq | EXTENDED_RELEASE_TABLET | Freq: Once | ORAL | Status: AC
  Administered 2024-01-22: 40 meq via ORAL
  Filled 2024-01-22: qty 2

## 2024-01-22 MED ORDER — CALCIUM GLUCONATE-NACL 1-0.675 GM/50ML-% IV SOLN
1.0000 g | Freq: Once | INTRAVENOUS | Status: AC
  Administered 2024-01-22: 1000 mg via INTRAVENOUS
  Filled 2024-01-22: qty 50

## 2024-01-22 MED ORDER — ENOXAPARIN SODIUM 80 MG/0.8ML IJ SOSY
65.0000 mg | PREFILLED_SYRINGE | Freq: Every day | INTRAMUSCULAR | Status: DC
  Administered 2024-01-22 – 2024-01-23 (×2): 65 mg via SUBCUTANEOUS
  Filled 2024-01-22 (×2): qty 0.8

## 2024-01-22 MED ORDER — IOHEXOL 300 MG/ML  SOLN
100.0000 mL | Freq: Once | INTRAMUSCULAR | Status: AC | PRN
  Administered 2024-01-22: 100 mL via INTRAVENOUS

## 2024-01-22 MED ORDER — MAGNESIUM SULFATE 2 GM/50ML IV SOLN
2.0000 g | Freq: Once | INTRAVENOUS | Status: AC
  Administered 2024-01-22: 2 g via INTRAVENOUS
  Filled 2024-01-22: qty 50

## 2024-01-22 MED ORDER — DULOXETINE HCL 30 MG PO CPEP
30.0000 mg | ORAL_CAPSULE | Freq: Two times a day (BID) | ORAL | Status: DC
  Administered 2024-01-22 – 2024-01-24 (×4): 30 mg via ORAL
  Filled 2024-01-22 (×4): qty 1

## 2024-01-22 MED ORDER — PANTOPRAZOLE SODIUM 40 MG IV SOLR
40.0000 mg | Freq: Once | INTRAVENOUS | Status: AC
  Administered 2024-01-22: 40 mg via INTRAVENOUS
  Filled 2024-01-22: qty 10

## 2024-01-22 MED ORDER — SODIUM CHLORIDE 0.9 % IV SOLN: INTRAVENOUS | Status: AC

## 2024-01-22 MED ORDER — BUTALBITAL-APAP-CAFFEINE 50-325-40 MG PO TABS
1.0000 | ORAL_TABLET | Freq: Once | ORAL | Status: AC | PRN
  Administered 2024-01-22: 1 via ORAL
  Filled 2024-01-22: qty 1

## 2024-01-22 MED ORDER — DIPHENHYDRAMINE HCL 50 MG/ML IJ SOLN
25.0000 mg | Freq: Once | INTRAMUSCULAR | Status: AC
  Administered 2024-01-22: 25 mg via INTRAVENOUS
  Filled 2024-01-22: qty 1

## 2024-01-22 NOTE — H&P (Addendum)
 " History and Physical    Patient: Tiffany Romero DOB: 1970-06-26 DOA: 01/22/2024 DOS: the patient was seen and examined on 01/22/2024 PCP: Wallie Drafts, NP  Patient coming from: Home  Chief Complaint:  Chief Complaint  Patient presents with   Abdominal Pain   HPI: Tiffany Romero is a 54 y.o. female with medical history significant of HLD, BIpolar disoder, undiagnosed obstructive pulmonary disease,, microscopic colitis morbid obesity, chronic back pain, seasonal allergies presents today with 1 day history of N/V/abd pain/diarrhea.  Reports pain started suddenly yesterday.  Continued through the night.  Due to degree of nausea vomiting she was having home and inability to tolerate p.o. she presented to Emergency Department today.  Denies any chest pain, shortness of breath, no changes in her medications, blurry vision.  No lower extremity edema.  No sick contacts that she knows of.  Denies any melena or hematochezia.  No dysuria or hematuria.  No cough.  In ED: Labs significant for potassium 2.8, chloride 112, metabolic acidosis with CO2 of 18, magnesium  1.1, calcium  6.8, white blood cells 11.1, hemoglobin 16.1.  She had chest x-ray which revealed interstitial prominence bilaterally, possible edema versus infiltrate.  CT abdomen revealed hepatic steatosis and bilateral total hip replacements but otherwise negative.  TRH called for admission in light of inability to take p.o.    Review of Systems: As mentioned in the history of present illness. All other systems reviewed and are negative. Past Medical History:  Diagnosis Date   Anxiety    takes lexapro    Arthritis    djd   Bipolar 1 disorder (HCC)    not on medications at this time   Carpal tunnel syndrome, bilateral    Chronic back pain    Headache(784.0)    history of migraines related to stress   PONV (postoperative nausea and vomiting)    Past Surgical History:  Procedure Laterality Date   ABDOMINAL  HYSTERECTOMY  2007   ANTERIOR CRUCIATE LIGAMENT REPAIR     CARPAL TUNNEL RELEASE  01/24/2012   Procedure: CARPAL TUNNEL RELEASE;  Surgeon: Dempsey JINNY Sensor, MD;  Location: [facility];  Service: Orthopedics;  Laterality: Left;   CERVIX SURGERY  1990   CESAREAN SECTION  1995   1989   FOOT SURGERY Right 2015   KNEE ARTHROSCOPY Right 1991   and [id number]   TOTAL HIP ARTHROPLASTY  01/18/2011   Procedure: TOTAL HIP ARTHROPLASTY;  Surgeon: Dempsey JINNY Sensor;  Location: [facility];  Service: Orthopedics;  Laterality: Right;  DEPUY/PINNICAL POLY   TOTAL HIP ARTHROPLASTY Left 10/05/2021   Procedure: LEFT TOTAL HIP ARTHROPLASTY ANTERIOR APPROACH;  Surgeon: Sensor Dempsey, MD;  Location: [facility];  Service: Orthopedics;  Laterality: Left;   TUBAL LIGATION  1996   Social History:  reports that she has been smoking cigarettes and e-cigarettes. She has a 10 pack-year smoking history. She has never used smokeless tobacco. She reports current alcohol use. She reports current drug use. Drug: Marijuana.  Allergies[1]  Family History  Problem Relation Age of Onset   Alcohol abuse Mother    Depression Mother    Alcohol abuse Father    Depression Sister    Alcohol abuse Sister    Depression Paternal Grandmother    Drug abuse Son    Alcohol abuse Son    Bipolar disorder Son     Prior to Admission medications  Medication Sig Start Date End Date Taking? Authorizing Provider  albuterol  (PROAIR  HFA) 108 (90 Base)  MCG/ACT inhaler Inhale 2 puffs into the lungs daily as needed for shortness of breath. 12/15/15   [provider]  atorvastatin  (LIPITOR) 20 MG tablet Take 20 mg by mouth daily. 09/18/21   [provider]  azelastine  (ASTELIN ) 0.1 % nasal spray Place 1-2 sprays into both nostrils 2 (two) times daily as needed (nasal drainage). Use in each nostril as directed 04/18/23   Luke Orlan HERO, DO  baclofen  (LIORESAL ) 10 MG tablet Take 10 mg by mouth 3 (three) times daily.    [provider]  BREZTRI AEROSPHERE 160-9-4.8 MCG/ACT AERO Inhale 2 puffs into the lungs 2 (two) times daily. 08/16/22   [provider]  Cholecalciferol 1.25 MG (50000 UT) TABS Take 1 tablet by mouth once a week. 09/24/22   [provider]  ciprofloxacin  (CIPRO ) 500 MG tablet Take 1 tablet (500 mg total) by mouth 2 (two) times daily. One po bid x 7 days 08/10/23   Suzette Pac, MD  clindamycin  (CLINDAGEL) 1 % gel Apply topically 2 (two) times daily. 06/10/22   Theadore Ozell HERO, MD  fluticasone  (FLONASE ) 50 MCG/ACT nasal spray Place 1 spray into both nostrils 2 (two) times daily as needed (nasal congestion). 03/21/23   Luke Orlan HERO, DO  gabapentin  (NEURONTIN ) 100 MG capsule Take 1 capsule (100 mg total) by mouth 3 (three) times daily. 06/23/23   Patel, Kunjan B, MD  levocetirizine (XYZAL ) 5 MG tablet Take 5 mg by mouth daily. 09/18/21   [provider]  meloxicam (MOBIC) 15 MG tablet Take 15 mg by mouth daily.    [provider]  metroNIDAZOLE  (FLAGYL ) 500 MG tablet Take 1 tablet (500 mg total) by mouth 4 (four) times daily. 08/10/23   Zammit, Joseph, MD  omeprazole (PRILOSEC) 20 MG capsule Take by mouth. 03/12/22   [provider]  ondansetron  (ZOFRAN -ODT) 4 MG disintegrating tablet 4mg  ODT q4 hours prn nausea/vomit 08/10/23   Zammit, Joseph, MD  oxyCODONE -acetaminophen  (PERCOCET/ROXICET) 5-325 MG tablet Take 1 tablet by mouth every 6 (six) hours as needed for severe pain (pain score 7-10). 08/10/23   Zammit, Joseph, MD  phentermine 37.5 MG capsule Take 37.5 mg by mouth every morning.    [provider]    Physical Exam: Vitals:   01/22/24 1525 01/22/24 1526 01/22/24 1550 01/22/24 1800  BP:  131/69 (!) 128/108   Pulse:  (!) 56 65 69  Resp:  18 (!) 22 13  Temp:  (!) 97.1 F (36.2 C)    TempSrc:  Axillary    SpO2:  100% 100% 96%  Weight: 131.5 kg     Height: 5' 9 (1.753 m)      Gen:  Alert, cooperative patient who appears stated age in no acute distress.  Vital  signs reviewed. HEENT: Dry mucous membranes noted Heart: Regular rate and rhythm without murmur Lungs: Clear to auscultation throughout without any wheezing on exam Abdomen: Minimal right upper quadrant and epigastric tenderness.  Otherwise no tenderness throughout.  Good bowel sounds throughout.  No guarding or rebound.  Soft/obese but not distended Extremities: No lower extremity edema Neuro: Alert and orient x 4.  No focal neurological deficits. Psych: Confluent speech.  Calm.  Affect appears normal.   Assessment and Plan: Intractable nausea/vomiting/diarrhea: - Most likely viral gastroenteritis, she knows of no sick contacts however. - She actually is little better now.  Able to sip a little bit from water though she does report her nausea is now starting to return.  Last episode  of vomiting was being picked up by the ambulance prior to coming to the emergency department. -Last food intake of any substance was yesterday at lunch. - She is status post 1 L fluid bolus. - Lactic acid was initially elevated and plan to repeat fluid bolus and repeat lactic acid till clears. - Will start IV fluids at 150 cc/HR after second fluid bolus. - Unclear etiology.  No changes to food. -Continue antiemetics started in the emergency department. - Can start soft diet and advance as tolerated.  Electrolyte disorders: - Markedly low electrolytes, specifically potassium, magnesium , calcium . - Secondary to ongoing volume loss from vomiting. - She is status post 2 g magnesium  sulfate infusion, 80 mEq total potassium supplementation, calcium  gluconate 1 g. - Plan to recheck electrolytes tonight and again in the morning.  Microscopic colitis: - Diagnosed via colonoscopy in biopsy.  She follows with [facility] for this. - She is on budesonide  3 mg 3 tabs daily. - Does not appear to be current flare as her diarrhea is acute and accompanied with nausea and vomiting.  Leukocytosis: - likely reactive from  nausea/vomiting - will trend.    Chronic obstructive pulmonary disease, undiagnosed - Versus asthma.  Reports having COVID a few years ago being started on combination of Breztri/albuterol  due to chronic cough. - Reports this has been helpful for symptoms but on review of records I can actually see the actual diagnosis nor PFTs. - She is on Breztri/albuterol  patient will continue these in house. - No current exacerbation. - Follow-up outpatient for PFTs.  HLD: - Hold statin until she is taking p.o. again.  Bipolar disorder: - On commendation of Cymbalta  30 mg a day plus BuSpar  15 mg p.o. twice daily.  Will restart these in house.  Chronic back pain: - Takes meloxicam chronically for this. - Kidneys look good but there is increased risk for GI disturbance with this medication in light of her ongoing nausea and vomiting. - Because of this we will hold her meloxicam and she can restart this at discharge. - Also on gabapentin  40 mg p.o. 3 times daily, can restart this but at twice daily dosing while in house.  Seasonal allergies: - Continue home medications.   Advance Care Planning:   Code Status: Prior full code  Family Communication: None at bedside  Severity of Illness: The appropriate patient status for this patient is OBSERVATION. Observation status is judged to be reasonable and necessary in order to provide the required intensity of service to ensure the patient's safety. The patient's presenting symptoms, physical exam findings, and initial radiographic and laboratory data in the context of their medical condition is felt to place them at decreased risk for further clinical deterioration. Furthermore, it is anticipated that the patient will be medically stable for discharge from the hospital within 2 midnights of admission.   Author: Reyes VEAR Gaw, MD 01/22/2024 6:24 PM  For on call review [email or web address].      [1]  Allergies Allergen Reactions   Cephalexin Hives    Coumadin  [Warfarin Sodium ] Swelling   Trazodone And Nefazodone Other (See Comments)    migraine   "

## 2024-01-22 NOTE — Plan of Care (Signed)
   Problem: Activity: Goal: Risk for activity intolerance will decrease Outcome: Progressing   Problem: Coping: Goal: Level of anxiety will decrease Outcome: Progressing

## 2024-01-22 NOTE — ED Notes (Signed)
 Patient transported to CT

## 2024-01-22 NOTE — ED Notes (Signed)
 PA student at bedside.

## 2024-01-22 NOTE — ED Triage Notes (Signed)
 Pt c/o abd pain with n/v/d since last night.

## 2024-01-22 NOTE — ED Provider Notes (Signed)
 "  EMERGENCY DEPARTMENT AT [facility] Provider Note   CSN: [id number] Arrival date & time: 01/22/24  1521     Patient presents with: Abdominal Pain   Tiffany Romero is a 54 y.o. female.   Patient is a 54 year old female who presents emergency department the chief complaint of severe abdominal pain, nausea, vomiting, diarrhea which has been ongoing since last night.  She notes that she did have subjective fevers last night as well.  She denies any melena or hematochezia.  She has had no dysuria or hematuria.  She has had no recent falls or blunt abdominal trauma.  She denies any cough, congestion, rhinorrhea, sore throat.  She denies any other known sick contacts.   Abdominal Pain      Prior to Admission medications  Medication Sig Start Date End Date Taking? Authorizing Provider  albuterol  (PROAIR  HFA) 108 (90 Base) MCG/ACT inhaler Inhale 2 puffs into the lungs daily as needed for shortness of breath. 12/15/15   [provider]  atorvastatin  (LIPITOR) 20 MG tablet Take 20 mg by mouth daily. 09/18/21   [provider]  azelastine  (ASTELIN ) 0.1 % nasal spray Place 1-2 sprays into both nostrils 2 (two) times daily as needed (nasal drainage). Use in each nostril as directed 04/18/23   Luke Orlan CHRISTELLA, DO  baclofen  (LIORESAL ) 10 MG tablet Take 10 mg by mouth 3 (three) times daily.    [provider]  BREZTRI AEROSPHERE 160-9-4.8 MCG/ACT AERO Inhale 2 puffs into the lungs 2 (two) times daily. 08/16/22   [provider]  Cholecalciferol 1.25 MG (50000 UT) TABS Take 1 tablet by mouth once a week. 09/24/22   [provider]  ciprofloxacin  (CIPRO ) 500 MG tablet Take 1 tablet (500 mg total) by mouth 2 (two) times daily. One po bid x 7 days 08/10/23   Suzette Pac, MD  clindamycin  (CLINDAGEL) 1 % gel Apply topically 2 (two) times daily. 06/10/22   Bero, Michael M, MD  fluticasone  (FLONASE ) 50 MCG/ACT nasal spray Place 1 spray into both  nostrils 2 (two) times daily as needed (nasal congestion). 03/21/23   Luke Orlan CHRISTELLA, DO  gabapentin  (NEURONTIN ) 100 MG capsule Take 1 capsule (100 mg total) by mouth 3 (three) times daily. 06/23/23   Patel, Kunjan B, MD  levocetirizine (XYZAL ) 5 MG tablet Take 5 mg by mouth daily. 09/18/21   [provider]  meloxicam (MOBIC) 15 MG tablet Take 15 mg by mouth daily.    [provider]  metroNIDAZOLE  (FLAGYL ) 500 MG tablet Take 1 tablet (500 mg total) by mouth 4 (four) times daily. 08/10/23   Zammit, Joseph, MD  omeprazole (PRILOSEC) 20 MG capsule Take by mouth. 03/12/22   [provider]  ondansetron  (ZOFRAN -ODT) 4 MG disintegrating tablet 4mg  ODT q4 hours prn nausea/vomit 08/10/23   Zammit, Joseph, MD  oxyCODONE -acetaminophen  (PERCOCET/ROXICET) 5-325 MG tablet Take 1 tablet by mouth every 6 (six) hours as needed for severe pain (pain score 7-10). 08/10/23   Zammit, Joseph, MD  phentermine 37.5 MG capsule Take 37.5 mg by mouth every morning.    [provider]    Allergies: Cephalexin, Coumadin  [warfarin sodium ], and Trazodone and nefazodone    Review of Systems  Gastrointestinal:  Positive for abdominal pain.  All other systems reviewed and are negative.   Updated Vital Signs BP (!) 128/108   Pulse 65   Temp (!) 97.1 F (36.2 C) (Axillary)   Resp (!) 22   Ht 5' 9 (  1.753 m)   Wt 131.5 kg   SpO2 100%   BMI 42.81 kg/m   Physical Exam Vitals and nursing note reviewed.  Constitutional:      General: She is not in acute distress.    Appearance: Normal appearance. She is not ill-appearing.  HENT:     Head: Normocephalic and atraumatic.     Nose: Nose normal.     Mouth/Throat:     Mouth: Mucous membranes are moist.  Eyes:     Extraocular Movements: Extraocular movements intact.     Conjunctiva/sclera: Conjunctivae normal.     Pupils: Pupils are equal, round, and reactive to light.  Cardiovascular:     Rate and Rhythm: Normal rate and regular rhythm.      Pulses: Normal pulses.     Heart sounds: Normal heart sounds. No murmur heard.    No gallop.  Pulmonary:     Effort: Pulmonary effort is normal. No respiratory distress.     Breath sounds: Normal breath sounds. No stridor. No wheezing, rhonchi or rales.  Abdominal:     General: Abdomen is flat. Bowel sounds are normal. There is no distension.     Palpations: Abdomen is soft.     Tenderness: There is generalized abdominal tenderness.  Musculoskeletal:        General: Normal range of motion.     Cervical back: Normal range of motion and neck supple.  Skin:    General: Skin is warm and dry.  Neurological:     General: No focal deficit present.     Mental Status: She is alert and oriented to person, place, and time. Mental status is at baseline.  Psychiatric:        Mood and Affect: Mood normal.        Behavior: Behavior normal.        Thought Content: Thought content normal.        Judgment: Judgment normal.     (all labs ordered are listed, but only abnormal results are displayed) Labs Reviewed  RESP PANEL BY RT-PCR (RSV, FLU A&B, COVID)  [id number]  COMPREHENSIVE METABOLIC PANEL WITH GFR  LIPASE, BLOOD  CBC WITH DIFFERENTIAL/PLATELET  URINALYSIS, ROUTINE W REFLEX MICROSCOPIC  LACTIC ACID, PLASMA  LACTIC ACID, PLASMA    EKG: None  Radiology: No results found.   Procedures   Medications Ordered in the ED  HYDROmorphone  (DILAUDID ) injection 1 mg (has no administration in time range)  metoCLOPramide  (REGLAN ) injection 10 mg (has no administration in time range)  diphenhydrAMINE  (BENADRYL ) injection 25 mg (has no administration in time range)  sodium chloride  0.9 % bolus 1,000 mL (has no administration in time range)    Clinical Course as of 01/22/24 1855  Sun Jan 22, 2024  1725 EKG 12-Lead [CP]    Clinical Course User Index [CP] Primus, Valentin PARAS, Student-PA                                 Medical Decision Making Amount and/or Complexity of Data Reviewed Labs:  ordered. Radiology: ordered.  Risk Prescription drug management. Decision regarding hospitalization.   This patient presents to the ED for concern of nausea, vomiting, diarrhea, abdominal pain, this involves an extensive number of treatment options, and is a complaint that carries with it a high risk of complications and morbidity.  The differential diagnosis includes gastroenteritis, colitis, acute appendicitis, cholecystitis, small bowel obstruction, diverticulitis, ovarian torsion or cyst, PID,  tubo-ovarian abscess, pyelonephritis, kidney stone, pancreatitis, mesenteric ischemia   Co morbidities that complicate the patient evaluation  Diverticulitis, bipolar disorder, chronic back pain   Additional history obtained:  Additional history obtained from none External records from outside source obtained and reviewed including none   Lab Tests:  I Ordered, and personally interpreted labs.  The pertinent results include: Mild leukocytosis, no anemia, normal kidney function liver function, hypokalemia, hypocalcemia, hypomagnesemia, downtrending lactic acid, negative respiratory panel   Imaging Studies ordered:  I ordered imaging studies including CT scan abdomen pelvis, chest x-ray I independently visualized and interpreted imaging which showed no acute intra-abdominal surgical process, interstitial prominence on chest x-ray I agree with the radiologist interpretation   Cardiac Monitoring: / EKG:  The patient was maintained on a cardiac monitor.  I personally viewed and interpreted the cardiac monitored which showed an underlying rhythm of: Sinus bradycardia, no ST/T wave changes, no ischemic changes, no STEMI   Consultations Obtained:  I requested consultation with the hospitalist,  and discussed lab and imaging findings as well as pertinent plan - they recommend: Admission   Problem List / ED Course / Critical interventions / Medication management  Patient does remain  stable at this time and symptoms have greatly improved.  Discussed with patient we will plan for admission to the hospitalist service given her electrolyte derangements.  Repletion has been started in the emergency department.  She has no EKG changes at this time.  CT scan of the abdomen and pelvis demonstrated no acute surgical process.  Chest x-ray did demonstrate interstitial prominence but patient has no cough, congestion and no other clear indication for pneumonia at this point and will hold antibiotics.  Have discussed patient case with Dr. Elpidio with the hospital service who has excepted for admission. I ordered medication including potassium, magnesium , calcium , Reglan , Benadryl , Protonix , Dilaudid  for hypocalcemia, hypomagnesemia, hypokalemia, abdominal pain, nausea, vomiting, diarrhea Reevaluation of the patient after these medicines showed that the patient improved I have reviewed the patients home medicines and have made adjustments as needed   Social Determinants of Health:  None   Test / Admission - Considered:  Admission     Final diagnoses:  None    ED Discharge Orders     None          Daralene Lonni JONETTA DEVONNA 01/22/24 1859    Cleotilde Rogue, MD 01/22/24 2103  "

## 2024-01-22 NOTE — ED Notes (Signed)
 Hospitalist at bedside

## 2024-01-23 DIAGNOSIS — Z818 Family history of other mental and behavioral disorders: Secondary | ICD-10-CM | POA: Diagnosis not present

## 2024-01-23 DIAGNOSIS — R112 Nausea with vomiting, unspecified: Secondary | ICD-10-CM

## 2024-01-23 DIAGNOSIS — G8929 Other chronic pain: Secondary | ICD-10-CM | POA: Diagnosis present

## 2024-01-23 DIAGNOSIS — K760 Fatty (change of) liver, not elsewhere classified: Secondary | ICD-10-CM | POA: Diagnosis present

## 2024-01-23 DIAGNOSIS — A0472 Enterocolitis due to Clostridium difficile, not specified as recurrent: Secondary | ICD-10-CM | POA: Diagnosis present

## 2024-01-23 DIAGNOSIS — F319 Bipolar disorder, unspecified: Secondary | ICD-10-CM | POA: Diagnosis present

## 2024-01-23 DIAGNOSIS — Z888 Allergy status to other drugs, medicaments and biological substances status: Secondary | ICD-10-CM | POA: Diagnosis not present

## 2024-01-23 DIAGNOSIS — Z96642 Presence of left artificial hip joint: Secondary | ICD-10-CM | POA: Diagnosis present

## 2024-01-23 DIAGNOSIS — J449 Chronic obstructive pulmonary disease, unspecified: Secondary | ICD-10-CM | POA: Diagnosis present

## 2024-01-23 DIAGNOSIS — E785 Hyperlipidemia, unspecified: Secondary | ICD-10-CM | POA: Diagnosis present

## 2024-01-23 DIAGNOSIS — Z791 Long term (current) use of non-steroidal anti-inflammatories (NSAID): Secondary | ICD-10-CM | POA: Diagnosis not present

## 2024-01-23 DIAGNOSIS — K52839 Microscopic colitis, unspecified: Secondary | ICD-10-CM | POA: Diagnosis present

## 2024-01-23 DIAGNOSIS — Z1152 Encounter for screening for COVID-19: Secondary | ICD-10-CM | POA: Diagnosis not present

## 2024-01-23 DIAGNOSIS — Z881 Allergy status to other antibiotic agents status: Secondary | ICD-10-CM | POA: Diagnosis not present

## 2024-01-23 DIAGNOSIS — E876 Hypokalemia: Secondary | ICD-10-CM | POA: Diagnosis present

## 2024-01-23 DIAGNOSIS — Z79899 Other long term (current) drug therapy: Secondary | ICD-10-CM | POA: Diagnosis not present

## 2024-01-23 DIAGNOSIS — Z90710 Acquired absence of both cervix and uterus: Secondary | ICD-10-CM | POA: Diagnosis not present

## 2024-01-23 DIAGNOSIS — E872 Acidosis, unspecified: Secondary | ICD-10-CM | POA: Diagnosis present

## 2024-01-23 DIAGNOSIS — F17210 Nicotine dependence, cigarettes, uncomplicated: Secondary | ICD-10-CM | POA: Diagnosis present

## 2024-01-23 LAB — COMPREHENSIVE METABOLIC PANEL WITH GFR
ALT: 28 U/L (ref 0–44)
AST: 23 U/L (ref 15–41)
Albumin: 3.9 g/dL (ref 3.5–5.0)
Alkaline Phosphatase: 78 U/L (ref 38–126)
Anion gap: 9 (ref 5–15)
BUN: <5 mg/dL — ABNORMAL LOW (ref 6–20)
CO2: 25 mmol/L (ref 22–32)
Calcium: 8.7 mg/dL — ABNORMAL LOW (ref 8.9–10.3)
Chloride: 108 mmol/L (ref 98–111)
Creatinine, Ser: 0.75 mg/dL (ref 0.44–1.00)
GFR, Estimated: >60 mL/min
Glucose, Bld: 95 mg/dL (ref 70–99)
Potassium: 4.3 mmol/L (ref 3.5–5.1)
Sodium: 142 mmol/L (ref 135–145)
Total Bilirubin: 0.4 mg/dL (ref 0.0–1.2)
Total Protein: 6.2 g/dL — ABNORMAL LOW (ref 6.5–8.1)

## 2024-01-23 LAB — CBC
HCT: 44.3 % (ref 36.0–46.0)
Hemoglobin: 14.6 g/dL (ref 12.0–15.0)
MCH: 29.6 pg (ref 26.0–34.0)
MCHC: 33 g/dL (ref 30.0–36.0)
MCV: 89.7 fL (ref 80.0–100.0)
Platelets: 158 K/uL (ref 150–400)
RBC: 4.94 MIL/uL (ref 3.87–5.11)
RDW: 14.7 % (ref 11.5–15.5)
WBC: 5.7 K/uL (ref 4.0–10.5)
nRBC: 0 % (ref 0.0–0.2)

## 2024-01-23 LAB — CLOSTRIDIUM DIFFICILE BY PCR, REFLEXED
Hypervirulent Strain: NEGATIVE
Toxigenic C. Difficile by PCR: POSITIVE — AB

## 2024-01-23 LAB — MAGNESIUM
Magnesium: 1.6 mg/dL — ABNORMAL LOW (ref 1.7–2.4)
Magnesium: 2.2 mg/dL (ref 1.7–2.4)

## 2024-01-23 LAB — HIV ANTIBODY (ROUTINE TESTING W REFLEX): HIV Screen 4th Generation wRfx: NONREACTIVE

## 2024-01-23 LAB — PHOSPHORUS: Phosphorus: 2.6 mg/dL (ref 2.5–4.6)

## 2024-01-23 LAB — C DIFFICILE QUICK SCREEN W PCR REFLEX
C Diff antigen: POSITIVE — AB
C Diff toxin: NEGATIVE

## 2024-01-23 MED ORDER — HYDROXYZINE HCL 10 MG PO TABS
10.0000 mg | ORAL_TABLET | Freq: Once | ORAL | Status: AC
  Administered 2024-01-23: 10 mg via ORAL
  Filled 2024-01-23: qty 1

## 2024-01-23 MED ORDER — HYDRALAZINE HCL 20 MG/ML IJ SOLN: 10.0000 mg | INTRAMUSCULAR | Status: DC | PRN

## 2024-01-23 MED ORDER — METOPROLOL TARTRATE 5 MG/5ML IV SOLN: 5.0000 mg | INTRAVENOUS | Status: DC | PRN

## 2024-01-23 MED ORDER — IPRATROPIUM-ALBUTEROL 0.5-2.5 (3) MG/3ML IN SOLN: 3.0000 mL | RESPIRATORY_TRACT | Status: DC | PRN

## 2024-01-23 MED ORDER — BUTALBITAL-APAP-CAFFEINE 50-325-40 MG PO TABS
1.0000 | ORAL_TABLET | Freq: Once | ORAL | Status: AC | PRN
  Administered 2024-01-23: 1 via ORAL
  Filled 2024-01-23: qty 1

## 2024-01-23 MED ORDER — ALUM & MAG HYDROXIDE-SIMETH 200-200-20 MG/5ML PO SUSP
30.0000 mL | Freq: Four times a day (QID) | ORAL | Status: DC | PRN
  Administered 2024-01-23: 30 mL via ORAL
  Filled 2024-01-23: qty 30

## 2024-01-23 MED ORDER — BUTALBITAL-APAP-CAFFEINE 50-325-40 MG PO TABS
1.0000 | ORAL_TABLET | Freq: Four times a day (QID) | ORAL | Status: DC | PRN
  Administered 2024-01-23 – 2024-01-24 (×4): 1 via ORAL
  Filled 2024-01-23 (×4): qty 1

## 2024-01-23 NOTE — Hospital Course (Addendum)
 Brief Narrative:   54 year old with history of HLD, bipolar, COPD, microscopic colitis, chronic pain, seasonal allergy admitted for nausea vomiting and abdominal discomfort.  She was found to have hypokalemia, hypomagnesemia, metabolic acidosis.  Chest x-ray showed possible bilateral infiltrate.  CT abdomen pelvis showing hepatic steatosis otherwise overall unremarkable.  She was found to have C. difficile colitis therefore started on 10 days of p.o. vancomycin .  She was tolerating p.o. without any issues therefore discharged in stable condition.  Assessment & Plan:   Intractable nausea/vomiting/diarrhea, resolved C. difficile infection Likely from recent antibiotic use.  C. difficile toxin PCR positive.  Due to symptoms of diarrhea, we will treat with 10 days of p.o. vancomycin .  CT abdomen pelvis is unremarkable besides hepatic steatosis.  Today she is tolerating diet without any issues.  Diarrhea has significantly improved.  She will follow-up outpatient with gastroenterology   Hypokalemia/hypomagnesemia Resume repletion.    Microscopic colitis: - Diagnosed via colonoscopy in biopsy.  She follows with [facility] for this. - She is on budesonide  3 mg 3 tabs daily. - Does not appear to be current flare as her diarrhea is acute and accompanied with nausea and vomiting.   Leukocytosis: Reactive, resolved   Chronic obstructive pulmonary disease, undiagnosed -Bronchodilators   HLD: - Resume   Bipolar disorder: - Continue Cymbalta , BuSpar    Chronic back pain: Will slowly resume home medications   Seasonal allergies: - Continue home medications.  DVT prophylaxis: Lovenox     Code Status: Full Code Family Communication:   Status is: Cont hosp stay   PT Follow up Recs:   Subjective: Feels much better, wishing to go home.  She will follow-up outpatient gastroenterology. She understands that she has C. difficile infection.  I have answered all the  questions   Examination:  General exam: Appears calm and comfortable  Respiratory system: Clear to auscultation. Respiratory effort normal. Cardiovascular system: S1 & S2 heard, RRR. No JVD, murmurs, rubs, gallops or clicks. No pedal edema. Gastrointestinal system: Abdomen is nondistended, soft and nontender. No organomegaly or masses felt. Normal bowel sounds heard. Central nervous system: Alert and oriented. No focal neurological deficits. Extremities: Symmetric 5 x 5 power. Skin: No rashes, lesions or ulcers Psychiatry: Judgement and insight appear normal. Mood & affect appropriate.

## 2024-01-23 NOTE — Progress Notes (Signed)
" °   01/23/24 0827  TOC Brief Assessment  Insurance and Status Reviewed  Patient has primary care physician Yes  Home environment has been reviewed From Home  Prior level of function: Independent  Prior/Current Home Services No current home services  Social Drivers of Health Review SDOH reviewed interventions complete  Readmission risk has been reviewed Yes  Transition of care needs no transition of care needs at this time   SDOH resources added to AVS.  ICM needs have been identified at this time. We will continue to monitor patient advancement through interdisciplinary progression rounds. If new patient transition needs arise, please place a ICM consult.  "

## 2024-01-23 NOTE — Discharge Instructions (Signed)
 Food Agency Name: [facility] Address: [address] [place], [place] [address] Phone: [phone] Website: www.adtsrc.org Services Offered: Meals on [employer] and Meals with Friends.   Home care, at home assisted living, volunteer services, [facility]  [facility] Ret[facility], transportation  Agency Name: [facility] Address: Sites vary. Must call first. Food Pantry location: [address] [place], [place] [address] [employer] [place] Phone: [phone] Website: none Services Offered: Museum/gallery curator, utility assistance if funds available Careers Adviser for all of [place], [employer], [place]  [employer] , Office Manager and [employer] for [employer] area only) Walk-in  current Id and current address verification required.  Wed-Thurs: 9:30-12:00 Agency Name: [facility] Address: [address], [place], [place] [address]  Phone: [phone] or [phone] Website: none Services Offered: Food assistance Agency Name: [employer] Address: [address] [place], [place] [address]  Phone: [phone] or [phone]  Website: none Services Offered: Serves 1 hot meal a day at 11:00 am Monday-Sunday and 5 pm  on the second and fourth Sunday of each month Agency Name: [facility] and [facility]  Services/Social Services  Address: [phone], [place], [place] [address] Phone: [phone] Website: [email or web address].[email or web address]..[email or web address]   [email or web address] Services Offered: Sales Executive, [facility] program Agency Name: [facility] Address: [address] [place], [place] [address] Phone: [phone] Website: [email or web address] Services Offered: Food pantry Tuesday, Wednesday and Thursday 9am-11:30am  (need appointment) and health clinic (9:00am-11:00am)  Agency Name: [facility] Address: [address] [place] / [address] [place] Phone: [phone] [place] / [phone] [place] Website: [email or web address] [email or web address].[email or web address] Services Offered: Civil Service Fast Streamer, food pantry, soup kitchen Psychologist, Counselling) emergency financial  assistance, thrift stores, showers & hygiene products ([place]),  Christmas Assistance, spiritual Data Processing Manager Name: [facility] [place] [facility] Address: [address], [place], [place] [address] Phone: [phone] Website: www.adtsrc.org Services Offered: Meals on [employer]. Home care, at home assisted  living, volunteer services, [facility],  [employer] and [employer] transportation system.  [phone] please call and dial ex [phone] ( in county ) [phone] Agency Name: [employer] [facility] Address: [address] [place], [place] [address] Phone: [phone] Website: [email or web address] Services Offered: Transportation for a fee.

## 2024-01-23 NOTE — Progress Notes (Signed)
 " PROGRESS NOTE    Tiffany Romero  FMW:[id number] DOB: 1970/12/20 DOA: 01/22/2024 PCP: Wallie Drafts, NP    Brief Narrative:   54 year old with history of HLD, bipolar, COPD, microscopic colitis, chronic pain, seasonal allergy admitted for nausea vomiting and abdominal discomfort.  She was found to have hypokalemia, hypomagnesemia, metabolic acidosis.  Chest x-ray showed possible bilateral infiltrate.  CT abdomen pelvis showing hepatic steatosis otherwise overall unremarkable  Assessment & Plan:   Intractable nausea/vomiting/diarrhea: Suspect viral gastroenteritis.  CT abdomen pelvis shows hepatic steatosis. -Stool studies ordered - IV fluids - Supportive care, diet as tolerated   Hypokalemia/hypomagnesemia Resume repletion.  Check phosphorus and ionized calcium    Microscopic colitis: - Diagnosed via colonoscopy in biopsy.  She follows with [facility] for this. - She is on budesonide  3 mg 3 tabs daily. - Does not appear to be current flare as her diarrhea is acute and accompanied with nausea and vomiting.   Leukocytosis: Reactive, resolved   Chronic obstructive pulmonary disease, undiagnosed -Bronchodilators   HLD: - Hold statin until she is taking p.o. again.   Bipolar disorder: - On commendation of Cymbalta  30 mg a day plus BuSpar  15 mg p.o. twice daily.  Will restart these in house.   Chronic back pain: Will slowly resume home medications   Seasonal allergies: - Continue home medications.  DVT prophylaxis: Lovenox     Code Status: Full Code Family Communication:   Status is: Cont hosp stay   PT Follow up Recs:   Subjective:  Still feels weak. Having diarrhea.   Examination:  General exam: Appears calm and comfortable  Respiratory system: Clear to auscultation. Respiratory effort normal. Cardiovascular system: S1 & S2 heard, RRR. No JVD, murmurs, rubs, gallops or clicks. No pedal edema. Gastrointestinal system: Abdomen is nondistended, soft and  nontender. No organomegaly or masses felt. Normal bowel sounds heard. Central nervous system: Alert and oriented. No focal neurological deficits. Extremities: Symmetric 5 x 5 power. Skin: No rashes, lesions or ulcers Psychiatry: Judgement and insight appear normal. Mood & affect appropriate.                Diet Orders (From admission, onward)     Start     Ordered   01/22/24 1952  DIET SOFT Room service appropriate? Yes; Fluid consistency: Thin  Diet effective now       Question Answer Comment  Room service appropriate? Yes   Fluid consistency: Thin      01/22/24 1951            Objective: Vitals:   01/22/24 1800 01/22/24 2100 01/23/24 0117 01/23/24 0549  BP:  (!) 151/77 128/74 132/79  Pulse: 69 77 64 60  Resp: 13 18 20 20   Temp:  99.6 F (37.6 C) 98.8 F (37.1 C) 99 F (37.2 C)  TempSrc:  Oral Oral Oral  SpO2: 96% 96% 97% 100%  Weight:      Height:        Intake/Output Summary (Last 24 hours) at 01/23/2024 1058 Last data filed at 01/22/2024 1958 Gross per 24 hour  Intake 1050 ml  Output --  Net 1050 ml   Filed Weights   01/22/24 1525  Weight: 131.5 kg    Scheduled Meds:  baclofen   10 mg Oral TID   budesonide   9 mg Oral Daily   busPIRone   15 mg Oral BID   DULoxetine   30 mg Oral BID   enoxaparin  (LOVENOX ) injection  65 mg Subcutaneous QHS   gabapentin   400 mg Oral TID   loratadine   10 mg Oral Daily   Continuous Infusions:  sodium chloride  150 mL/hr at 01/23/24 0757    Nutritional status     Body mass index is 42.81 kg/m.  Data Reviewed:   CBC: Recent Labs  Lab 01/22/24 1549 01/23/24 0442  WBC 11.1* 5.7  NEUTROABS 9.4*  --   HGB 16.1* 14.6  HCT 46.6* 44.3  MCV 85.7 89.7  PLT 215 158   Basic Metabolic Panel: Recent Labs  Lab 01/22/24 1630 01/22/24 1650 01/22/24 1715 01/23/24 0442 01/23/24 0937  NA  --  142  --  142  --   K  --  2.8*  --  4.3  --   CL  --  112*  --  108  --   CO2  --  18*  --  25  --   GLUCOSE  --   78  --  95  --   BUN  --  5*  --  <5*  --   CREATININE  --  0.50  --  0.75  --   CALCIUM   --  6.8*  --  8.7*  --   MG 1.6*  --  1.1*  --  2.2  PHOS  --   --   --   --  2.6   GFR: Estimated Creatinine Clearance: 117.1 mL/min (by C-G formula based on SCr of 0.75 mg/dL). Liver Function Tests: Recent Labs  Lab 01/22/24 1650 01/23/24 0442  AST 19 23  ALT 23 28  ALKPHOS 64 78  BILITOT 0.6 0.4  PROT 5.2* 6.2*  ALBUMIN 3.5 3.9   Recent Labs  Lab 01/22/24 1650  LIPASE 16   No results for input(s): AMMONIA in the last 168 hours. Coagulation Profile: No results for input(s): INR, PROTIME in the last 168 hours. Cardiac Enzymes: No results for input(s): CKTOTAL, CKMB, CKMBINDEX, TROPONINI in the last 168 hours. BNP (last 3 results) No results for input(s): PROBNP in the last 8760 hours. HbA1C: No results for input(s): HGBA1C in the last 72 hours. CBG: No results for input(s): GLUCAP in the last 168 hours. Lipid Profile: No results for input(s): CHOL, HDL, LDLCALC, TRIG, CHOLHDL, LDLDIRECT in the last 72 hours. Thyroid Function Tests: No results for input(s): TSH, T4TOTAL, FREET4, T3FREE, THYROIDAB in the last 72 hours. Anemia Panel: No results for input(s): VITAMINB12, FOLATE, FERRITIN, TIBC, IRON, RETICCTPCT in the last 72 hours. Sepsis Labs: Recent Labs  Lab 01/22/24 1603 01/22/24 1752  LATICACIDVEN 2.7* 1.2    Recent Results (from the past 240 hours)  Resp panel by RT-PCR (RSV, Flu A&B, Covid) Anterior Nasal Swab     Status: None   Collection Time: 01/22/24  3:52 PM   Specimen: Anterior Nasal Swab  Result Value Ref Range Status   SARS Coronavirus 2 by RT PCR NEGATIVE NEGATIVE Final    Comment: (NOTE) SARS-CoV-2 target nucleic acids are NOT DETECTED.  The SARS-CoV-2 RNA is generally detectable in upper respiratory specimens during the acute phase of infection. The lowest concentration of SARS-CoV-2 viral copies  this assay can detect is 138 copies/mL. A negative result does not preclude SARS-Cov-2 infection and should not be used as the sole basis for treatment or other patient management decisions. A negative result may occur with  improper specimen collection/handling, submission of specimen other than nasopharyngeal swab, presence of viral mutation(s) within the areas targeted by this assay, and inadequate number of viral copies(<138 copies/mL). A negative result must be  combined with clinical observations, patient history, and epidemiological information. The expected result is Negative.  Fact Sheet for Patients:  bloggercourse.com  Fact Sheet for Healthcare Providers:  seriousbroker.it  This test is no t yet approved or cleared by the [place]  FDA and  has been authorized for detection and/or diagnosis of SARS-CoV-2 by FDA under an Emergency Use Authorization (EUA). This EUA will remain  in effect (meaning this test can be used) for the duration of the COVID-19 declaration under Section 564(b)(1) of the Act, 21 U.S.C.section 360bbb-3(b)(1), unless the authorization is terminated  or revoked sooner.       Influenza A by PCR NEGATIVE NEGATIVE Final   Influenza B by PCR NEGATIVE NEGATIVE Final    Comment: (NOTE) The Xpert Xpress SARS-CoV-2/FLU/RSV plus assay is intended as an aid in the diagnosis of influenza from Nasopharyngeal swab specimens and should not be used as a sole basis for treatment. Nasal washings and aspirates are unacceptable for Xpert Xpress SARS-CoV-2/FLU/RSV testing.  Fact Sheet for Patients: bloggercourse.com  Fact Sheet for Healthcare Providers: seriousbroker.it  This test is not yet approved or cleared by the [place]  FDA and has been authorized for detection and/or diagnosis of SARS-CoV-2 by FDA under an Emergency Use Authorization (EUA). This EUA  will remain in effect (meaning this test can be used) for the duration of the COVID-19 declaration under Section 564(b)(1) of the Act, 21 U.S.C. section 360bbb-3(b)(1), unless the authorization is terminated or revoked.     Resp Syncytial Virus by PCR NEGATIVE NEGATIVE Final    Comment: (NOTE) Fact Sheet for Patients: bloggercourse.com  Fact Sheet for Healthcare Providers: seriousbroker.it  This test is not yet approved or cleared by the [place]  FDA and has been authorized for detection and/or diagnosis of SARS-CoV-2 by FDA under an Emergency Use Authorization (EUA). This EUA will remain in effect (meaning this test can be used) for the duration of the COVID-19 declaration under Section 564(b)(1) of the Act, 21 U.S.C. section 360bbb-3(b)(1), unless the authorization is terminated or revoked.  Performed at [facility], [address] [place], [place] [address]          Radiology Studies: CT ABDOMEN PELVIS W CONTRAST Result Date: 01/22/2024 CLINICAL DATA:  Abdominal pain with nausea, vomiting and diarrhea. EXAM: CT ABDOMEN AND PELVIS WITH CONTRAST TECHNIQUE: Multidetector CT imaging of the abdomen and pelvis was performed using the standard protocol following bolus administration of intravenous contrast. RADIATION DOSE REDUCTION: This exam was performed according to the departmental dose-optimization program which includes automated exposure control, adjustment of the mA and/or kV according to patient size and/or use of iterative reconstruction technique. CONTRAST:  OMNIPAQUE  IOHEXOL  300 MG/ML  SOLN COMPARISON:  August 10, 2023 FINDINGS: Lower chest: No acute abnormality. Hepatobiliary: There is diffuse fatty infiltration of the liver parenchyma. No focal liver abnormality is seen. No gallstones, gallbladder wall thickening, or biliary dilatation. Pancreas: Unremarkable. No pancreatic ductal dilatation or surrounding  inflammatory changes. Spleen: Normal in size without focal abnormality. Adrenals/Urinary Tract: Adrenal glands are unremarkable. Kidneys are normal, without renal calculi, focal lesion, or hydronephrosis. The urinary bladder is limited in evaluation secondary to overlying streak artifact. Stomach/Bowel: Stomach is within normal limits. Appendix appears normal. No evidence of bowel wall thickening, distention, or inflammatory changes. Vascular/Lymphatic: No significant vascular findings are present. No enlarged abdominal or pelvic lymph nodes. Reproductive: Status post hysterectomy. The bilateral adnexa are limited in evaluation secondary to overlying streak artifact. Other: No abdominal wall hernia or abnormality. No abdominopelvic  ascites. 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 An extensive amount of associated streak artifact is noted with subsequently limited evaluation of the adjacent osseous and soft tissue structures. No acute osseous abnormalities are identified. IMPRESSION: 1. Hepatic steatosis. 2. Bilateral total hip replacements. Electronically Signed   By: Suzen Dials M.D.   On: 01/22/2024 17:52   DG Chest Port 1 View Result Date: 01/22/2024 CLINICAL DATA:  Cough, fever. Abdominal pain, nausea, vomiting, and diarrhea. EXAM: PORTABLE CHEST 1 VIEW COMPARISON:  06/09/2022. FINDINGS: The heart size and mediastinal contours are within normal limits. There is atherosclerotic calcification of the aorta. Increased interstitial prominence is noted bilaterally. No consolidation, effusion, or pneumothorax is seen. No acute osseous abnormality. IMPRESSION: Interstitial prominence bilaterally, possible edema or infiltrate. Electronically Signed   By: Leita Birmingham M.D.   On: 01/22/2024 16:59           LOS: 0 days   Time spent= 35 mins    Burgess JAYSON Dare, MD [facility]  If 7PM-7AM, please contact night-coverage  01/23/2024, 10:58 AM  "

## 2024-01-24 DIAGNOSIS — R112 Nausea with vomiting, unspecified: Secondary | ICD-10-CM | POA: Diagnosis not present

## 2024-01-24 LAB — COMPREHENSIVE METABOLIC PANEL WITH GFR
ALT: 39 U/L (ref 0–44)
AST: 37 U/L (ref 15–41)
Albumin: 4.3 g/dL (ref 3.5–5.0)
Alkaline Phosphatase: 82 U/L (ref 38–126)
Anion gap: 8 (ref 5–15)
BUN: 7 mg/dL (ref 6–20)
CO2: 30 mmol/L (ref 22–32)
Calcium: 8.9 mg/dL (ref 8.9–10.3)
Chloride: 104 mmol/L (ref 98–111)
Creatinine, Ser: 0.71 mg/dL (ref 0.44–1.00)
GFR, Estimated: >60 mL/min
Glucose, Bld: 78 mg/dL (ref 70–99)
Potassium: 3.8 mmol/L (ref 3.5–5.1)
Sodium: 142 mmol/L (ref 135–145)
Total Bilirubin: 0.5 mg/dL (ref 0.0–1.2)
Total Protein: 6.7 g/dL (ref 6.5–8.1)

## 2024-01-24 LAB — CALCIUM, IONIZED: Calcium, Ionized, Serum: 4.9 mg/dL (ref 4.5–5.6)

## 2024-01-24 LAB — PHOSPHORUS: Phosphorus: 2.7 mg/dL (ref 2.5–4.6)

## 2024-01-24 LAB — MAGNESIUM: Magnesium: 2.3 mg/dL (ref 1.7–2.4)

## 2024-01-24 MED ORDER — VANCOMYCIN HCL 125 MG PO CAPS: 125.0000 mg | ORAL_CAPSULE | Freq: Four times a day (QID) | ORAL | 0 refills | Status: AC

## 2024-01-24 MED ORDER — VANCOMYCIN HCL 125 MG PO CAPS
125.0000 mg | ORAL_CAPSULE | Freq: Four times a day (QID) | ORAL | Status: DC
  Administered 2024-01-24: 125 mg via ORAL
  Filled 2024-01-24: qty 1

## 2024-01-24 NOTE — Plan of Care (Signed)
   Problem: Activity: Goal: Risk for activity intolerance will decrease Outcome: Progressing   Problem: Coping: Goal: Level of anxiety will decrease Outcome: Progressing

## 2024-01-24 NOTE — Discharge Summary (Signed)
 Physician Discharge Summary  Tiffany Romero FMW:[id number] DOB: 09-23-1970 DOA: 01/22/2024  PCP: McClanahan, Kyra, NP  Admit date: 01/22/2024 Discharge date: 01/24/2024  Admitted From: Home Disposition: Home Home  Recommendations for Outpatient Follow-up:  Follow up with PCP in 1-2 weeks Please obtain BMP/CBC in one week your next doctors visit.  P.o. vancomycin  for total 10 days.  Reports she already has Zofran  at home therefore does not need any new prescription. She will follow-up outpatient with primary care physician and gastroenterology  Discharge Condition: Stable CODE STATUS: Full code Diet recommendation: As tolerated  Brief/Interim Summary: Brief Narrative:   54 year old with history of HLD, bipolar, COPD, microscopic colitis, chronic pain, seasonal allergy admitted for nausea vomiting and abdominal discomfort.  She was found to have hypokalemia, hypomagnesemia, metabolic acidosis.  Chest x-ray showed possible bilateral infiltrate.  CT abdomen pelvis showing hepatic steatosis otherwise overall unremarkable.  She was found to have C. difficile colitis therefore started on 10 days of p.o. vancomycin .  She was tolerating p.o. without any issues therefore discharged in stable condition.  Assessment & Plan:   Intractable nausea/vomiting/diarrhea, resolved C. difficile infection Likely from recent antibiotic use.  C. difficile toxin PCR positive.  Due to symptoms of diarrhea, we will treat with 10 days of p.o. vancomycin .  CT abdomen pelvis is unremarkable besides hepatic steatosis.  Today she is tolerating diet without any issues.  Diarrhea has significantly improved.  She will follow-up outpatient with gastroenterology   Hypokalemia/hypomagnesemia Resume repletion.    Microscopic colitis: - Diagnosed via colonoscopy in biopsy.  She follows with [facility] for this. - She is on budesonide  3 mg 3 tabs daily. - Does not appear to be current flare as her diarrhea is acute and  accompanied with nausea and vomiting.   Leukocytosis: Reactive, resolved   Chronic obstructive pulmonary disease, undiagnosed -Bronchodilators   HLD: - Resume   Bipolar disorder: - Continue Cymbalta , BuSpar    Chronic back pain: Will slowly resume home medications   Seasonal allergies: - Continue home medications.  DVT prophylaxis: Lovenox     Code Status: Full Code Family Communication:   Status is: Cont hosp stay   PT Follow up Recs:   Subjective: Feels much better, wishing to go home.  She will follow-up outpatient gastroenterology. She understands that she has C. difficile infection.  I have answered all the questions   Examination:  General exam: Appears calm and comfortable  Respiratory system: Clear to auscultation. Respiratory effort normal. Cardiovascular system: S1 & S2 heard, RRR. No JVD, murmurs, rubs, gallops or clicks. No pedal edema. Gastrointestinal system: Abdomen is nondistended, soft and nontender. No organomegaly or masses felt. Normal bowel sounds heard. Central nervous system: Alert and oriented. No focal neurological deficits. Extremities: Symmetric 5 x 5 power. Skin: No rashes, lesions or ulcers Psychiatry: Judgement and insight appear normal. Mood & affect appropriate.    Discharge Diagnoses:  Principal Problem:   Intractable nausea and vomiting      Discharge Exam: Vitals:   01/23/24 2051 01/24/24 0343  BP: 133/80 (!) 140/81  Pulse: (!) 59 (!) 53  Resp: 18 18  Temp: 97.7 F (36.5 C) 97.7 F (36.5 C)  SpO2: 100% 98%   Vitals:   01/23/24 0549 01/23/24 1313 01/23/24 2051 01/24/24 0343  BP: 132/79 113/76 133/80 (!) 140/81  Pulse: 60 73 (!) 59 (!) 53  Resp: 20  18 18   Temp: 99 F (37.2 C) (!) 97.3 F (36.3 C) 97.7 F (36.5 C) 97.7 F (36.5 C)  TempSrc: Oral Oral Oral Oral  SpO2: 100% 100% 100% 98%  Weight:      Height:          Discharge Instructions   Allergies as of 01/24/2024       Reactions   Cephalexin  Hives   Coumadin  [warfarin Sodium ] Swelling   Trazodone And Nefazodone Other (See Comments)   migraine        Medication List     TAKE these medications    azelastine  0.1 % nasal spray Commonly known as: ASTELIN  Place 1-2 sprays into both nostrils 2 (two) times daily as needed (nasal drainage). Use in each nostril as directed   baclofen  10 MG tablet Commonly known as: LIORESAL  Take 10 mg by mouth 3 (three) times daily.   Belsomra 20 MG Tabs Generic drug: Suvorexant Take 20 mg by mouth at bedtime.   Breztri Aerosphere 160-9-4.8 MCG/ACT Aero inhaler Generic drug: budesonide -glycopyrrolate -formoterol Inhale 2 puffs into the lungs 2 (two) times daily.   budesonide  3 MG 24 hr capsule Commonly known as: ENTOCORT EC  Take 9 mg by mouth daily.   busPIRone  15 MG tablet Commonly known as: BUSPAR  Take 15 mg by mouth 2 (two) times daily.   dicyclomine 10 MG capsule Commonly known as: BENTYL Take 10 mg by mouth every 6 (six) hours as needed for spasms.   DULoxetine  30 MG capsule Commonly known as: CYMBALTA  Take 30 mg by mouth 2 (two) times daily.   fluticasone  50 MCG/ACT nasal spray Commonly known as: FLONASE  Place 1 spray into both nostrils 2 (two) times daily as needed (nasal congestion).   gabapentin  400 MG capsule Commonly known as: NEURONTIN  Take 400 mg by mouth 3 (three) times daily.   levocetirizine 5 MG tablet Commonly known as: XYZAL  Take 5 mg by mouth daily.   montelukast 10 MG tablet Commonly known as: SINGULAIR Take 10 mg by mouth at bedtime.   Myrbetriq 25 MG Tb24 tablet Generic drug: mirabegron ER Take 25 mg by mouth daily.   omeprazole 40 MG capsule Commonly known as: PRILOSEC Take 40 mg by mouth daily.   ondansetron  4 MG disintegrating tablet Commonly known as: ZOFRAN -ODT 4mg  ODT q4 hours prn nausea/vomit   ondansetron  8 MG disintegrating tablet Commonly known as: ZOFRAN -ODT Take 8 mg by mouth every 8 (eight) hours as needed.   ProAir  HFA  108 (90 Base) MCG/ACT inhaler Generic drug: albuterol  Inhale 2 puffs into the lungs daily as needed for shortness of breath.   vancomycin  125 MG capsule Commonly known as: VANCOCIN  Take 1 capsule (125 mg total) by mouth 4 (four) times daily for 10 days.        Follow-up Information     McClanahan, Kyra, NP Follow up in 1 week(s).   Specialty: Adult Health Nurse Practitioner Contact information: [address] [place] [address] [phone]                Allergies[1]  You were cared for by a hospitalist during your hospital stay. If you have any questions about your discharge medications or the care you received while you were in the hospital after you are discharged, you can call the unit and asked to speak with the hospitalist on call if the hospitalist that took care of you is not available. Once you are discharged, your primary care physician will handle any further medical issues. Please note that no refills for any discharge medications will be authorized once you are discharged, as it is imperative  that you return to your primary care physician (or establish a relationship with a primary care physician if you do not have one) for your aftercare needs so that they can reassess your need for medications and monitor your lab values.  You were cared for by a hospitalist during your hospital stay. If you have any questions about your discharge medications or the care you received while you were in the hospital after you are discharged, you can call the unit and asked to speak with the hospitalist on call if the hospitalist that took care of you is not available. Once you are discharged, your primary care physician will handle any further medical issues. Please note that NO REFILLS for any discharge medications will be authorized once you are discharged, as it is imperative that you return to your primary care physician (or establish a relationship with a primary  care physician if you do not have one) for your aftercare needs so that they can reassess your need for medications and monitor your lab values.  Please request your Prim.MD to go over all Hospital Tests and Procedure/Radiological results at the follow up, please get all Hospital records sent to your Prim MD by signing hospital release before you go home.  Get CBC, CMP, 2 view Chest X ray checked  by Primary MD during your next visit or SNF MD in 5-7 days ( we routinely change or add medications that can affect your baseline labs and fluid status, therefore we recommend that you get the mentioned basic workup next visit with your PCP, your PCP may decide not to get them or add new tests based on their clinical decision)  On your next visit with your primary care physician please Get Medicines reviewed and adjusted.  If you experience worsening of your admission symptoms, develop shortness of breath, life threatening emergency, suicidal or homicidal thoughts you must seek medical attention immediately by calling 911 or calling your MD immediately  if symptoms less severe.  You Must read complete instructions/literature along with all the possible adverse reactions/side effects for all the Medicines you take and that have been prescribed to you. Take any new Medicines after you have completely understood and accpet all the possible adverse reactions/side effects.   Do not drive, operate heavy machinery, perform activities at heights, swimming or participation in water activities or provide baby sitting services if your were admitted for syncope or siezures until you have seen by Primary MD or a Neurologist and advised to do so again.  Do not drive when taking Pain medications.   Procedures/Studies: CT ABDOMEN PELVIS W CONTRAST Result Date: 01/22/2024 CLINICAL DATA:  Abdominal pain with nausea, vomiting and diarrhea. EXAM: CT ABDOMEN AND PELVIS WITH CONTRAST TECHNIQUE: Multidetector CT imaging of the  abdomen and pelvis was performed using the standard protocol following bolus administration of intravenous contrast. RADIATION DOSE REDUCTION: This exam was performed according to the departmental dose-optimization program which includes automated exposure control, adjustment of the mA and/or kV according to patient size and/or use of iterative reconstruction technique. CONTRAST:  OMNIPAQUE  IOHEXOL  300 MG/ML  SOLN COMPARISON:  August 10, 2023 FINDINGS: Lower chest: No acute abnormality. Hepatobiliary: There is diffuse fatty infiltration of the liver parenchyma. No focal liver abnormality is seen. No gallstones, gallbladder wall thickening, or biliary dilatation. Pancreas: Unremarkable. No pancreatic ductal dilatation or surrounding inflammatory changes. Spleen: Normal in size without focal abnormality. Adrenals/Urinary Tract: Adrenal glands are unremarkable. Kidneys are normal, without renal calculi, focal lesion, or  hydronephrosis. The urinary bladder is limited in evaluation secondary to overlying streak artifact. Stomach/Bowel: Stomach is within normal limits. Appendix appears normal. No evidence of bowel wall thickening, distention, or inflammatory changes. Vascular/Lymphatic: No significant vascular findings are present. No enlarged abdominal or pelvic lymph nodes. Reproductive: Status post hysterectomy. The bilateral adnexa are limited in evaluation secondary to overlying streak artifact. Other: No abdominal wall hernia or abnormality. No abdominopelvic ascites. 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 An extensive amount of associated streak artifact is noted with subsequently limited evaluation of the adjacent osseous and soft tissue structures. No acute osseous abnormalities are identified. IMPRESSION: 1. Hepatic steatosis. 2. Bilateral total hip replacements. Electronically Signed   By: Suzen Dials M.D.   On: 01/22/2024 17:52   DG Chest Port 1 View Result Date:  01/22/2024 CLINICAL DATA:  Cough, fever. Abdominal pain, nausea, vomiting, and diarrhea. EXAM: PORTABLE CHEST 1 VIEW COMPARISON:  06/09/2022. FINDINGS: The heart size and mediastinal contours are within normal limits. There is atherosclerotic calcification of the aorta. Increased interstitial prominence is noted bilaterally. No consolidation, effusion, or pneumothorax is seen. No acute osseous abnormality. IMPRESSION: Interstitial prominence bilaterally, possible edema or infiltrate. Electronically Signed   By: Leita Birmingham M.D.   On: 01/22/2024 16:59     The results of significant diagnostics from this hospitalization (including imaging, microbiology, ancillary and laboratory) are listed below for reference.     Microbiology: Recent Results (from the past 240 hours)  Resp panel by RT-PCR (RSV, Flu A&B, Covid) Anterior Nasal Swab     Status: None   Collection Time: 01/22/24  3:52 PM   Specimen: Anterior Nasal Swab  Result Value Ref Range Status   SARS Coronavirus 2 by RT PCR NEGATIVE NEGATIVE Final    Comment: (NOTE) SARS-CoV-2 target nucleic acids are NOT DETECTED.  The SARS-CoV-2 RNA is generally detectable in upper respiratory specimens during the acute phase of infection. The lowest concentration of SARS-CoV-2 viral copies this assay can detect is 138 copies/mL. A negative result does not preclude SARS-Cov-2 infection and should not be used as the sole basis for treatment or other patient management decisions. A negative result may occur with  improper specimen collection/handling, submission of specimen other than nasopharyngeal swab, presence of viral mutation(s) within the areas targeted by this assay, and inadequate number of viral copies(<138 copies/mL). A negative result must be combined with clinical observations, patient history, and epidemiological information. The expected result is Negative.  Fact Sheet for Patients:  bloggercourse.com  Fact  Sheet for Healthcare Providers:  seriousbroker.it  This test is no t yet approved or cleared by the [place]  FDA and  has been authorized for detection and/or diagnosis of SARS-CoV-2 by FDA under an Emergency Use Authorization (EUA). This EUA will remain  in effect (meaning this test can be used) for the duration of the COVID-19 declaration under Section 564(b)(1) of the Act, 21 U.S.C.section 360bbb-3(b)(1), unless the authorization is terminated  or revoked sooner.       Influenza A by PCR NEGATIVE NEGATIVE Final   Influenza B by PCR NEGATIVE NEGATIVE Final    Comment: (NOTE) The Xpert Xpress SARS-CoV-2/FLU/RSV plus assay is intended as an aid in the diagnosis of influenza from Nasopharyngeal swab specimens and should not be used as a sole basis for treatment. Nasal washings and aspirates are unacceptable for Xpert Xpress SARS-CoV-2/FLU/RSV testing.  Fact Sheet for Patients: bloggercourse.com  Fact Sheet for Healthcare Providers: seriousbroker.it  This test is not yet approved or cleared  by the [place]  FDA and has been authorized for detection and/or diagnosis of SARS-CoV-2 by FDA under an Emergency Use Authorization (EUA). This EUA will remain in effect (meaning this test can be used) for the duration of the COVID-19 declaration under Section 564(b)(1) of the Act, 21 U.S.C. section 360bbb-3(b)(1), unless the authorization is terminated or revoked.     Resp Syncytial Virus by PCR NEGATIVE NEGATIVE Final    Comment: (NOTE) Fact Sheet for Patients: bloggercourse.com  Fact Sheet for Healthcare Providers: seriousbroker.it  This test is not yet approved or cleared by the [place]  FDA and has been authorized for detection and/or diagnosis of SARS-CoV-2 by FDA under an Emergency Use Authorization (EUA). This EUA will remain in effect  (meaning this test can be used) for the duration of the COVID-19 declaration under Section 564(b)(1) of the Act, 21 U.S.C. section 360bbb-3(b)(1), unless the authorization is terminated or revoked.  Performed at [facility], [address] [place], [place] [address]   C Difficile Quick Screen w PCR reflex     Status: Abnormal   Collection Time: 01/23/24  9:17 AM   Specimen: STOOL  Result Value Ref Range Status   C Diff antigen POSITIVE (A) NEGATIVE Final   C Diff toxin NEGATIVE NEGATIVE Final   C Diff interpretation Results are indeterminate. See PCR results.  Final    Comment: RESULT CALLED TO, READ BACK BY AND VERIFIED WITH: K DILLARD AT 1104 ON 01.12.26 BY ADGER J  Performed at [facility], [address] [place], [place] [address]   C. Diff by PCR, Reflexed     Status: Abnormal   Collection Time: 01/23/24  9:17 AM  Result Value Ref Range Status   Toxigenic C. Difficile by PCR POSITIVE (A) NEGATIVE Final    Comment: Positive for toxigenic C. difficile with little to no toxin production. Only treat if clinical presentation suggests symptomatic illness.   Hypervirulent Strain PRESUMPTIVE NEGATIVE PRESUMPTIVE NEGATIVE Final    Comment: Performed at [facility], [address] [place], [place] [address]     Labs: BNP (last 3 results) No results for input(s): BNP in the last 8760 hours. Basic Metabolic Panel: Recent Labs  Lab 01/22/24 1630 01/22/24 1650 01/22/24 1715 01/23/24 0442 01/23/24 0937 01/24/24 0529  NA  --  142  --  142  --  142  K  --  2.8*  --  4.3  --  3.8  CL  --  112*  --  108  --  104  CO2  --  18*  --  25  --  30  GLUCOSE  --  78  --  95  --  78  BUN  --  5*  --  <5*  --  7  CREATININE  --  0.50  --  0.75  --  0.71  CALCIUM   --  6.8*  --  8.7*  --  8.9  MG 1.6*  --  1.1*  --  2.2 2.3  PHOS  --   --   --   --  2.6 2.7   Liver Function Tests: Recent Labs  Lab 01/22/24 1650 01/23/24 0442 01/24/24 0529  AST 19 23 37  ALT 23 28 39   ALKPHOS 64 78 82  BILITOT 0.6 0.4 0.5  PROT 5.2* 6.2* 6.7  ALBUMIN 3.5 3.9 4.3   Recent Labs  Lab 01/22/24 1650  LIPASE 16   No results for input(s): AMMONIA in the last 168 hours.  CBC: Recent Labs  Lab 01/22/24 1549 01/23/24 0442  WBC 11.1* 5.7  NEUTROABS 9.4*  --   HGB 16.1* 14.6  HCT 46.6* 44.3  MCV 85.7 89.7  PLT 215 158   Cardiac Enzymes: No results for input(s): CKTOTAL, CKMB, CKMBINDEX, TROPONINI in the last 168 hours. BNP: Invalid input(s): POCBNP CBG: No results for input(s): GLUCAP in the last 168 hours. D-Dimer No results for input(s): DDIMER in the last 72 hours. Hgb A1c No results for input(s): HGBA1C in the last 72 hours. Lipid Profile No results for input(s): CHOL, HDL, LDLCALC, TRIG, CHOLHDL, LDLDIRECT in the last 72 hours. Thyroid function studies No results for input(s): TSH, T4TOTAL, T3FREE, THYROIDAB in the last 72 hours.  Invalid input(s): FREET3 Anemia work up No results for input(s): VITAMINB12, FOLATE, FERRITIN, TIBC, IRON, RETICCTPCT in the last 72 hours. Urinalysis    Component Value Date/Time   COLORURINE YELLOW 01/22/2024 2200   APPEARANCEUR CLEAR 01/22/2024 2200   LABSPEC 1.040 (H) 01/22/2024 2200   PHURINE 7.0 01/22/2024 2200   GLUCOSEU NEGATIVE 01/22/2024 2200   HGBUR NEGATIVE 01/22/2024 2200   BILIRUBINUR NEGATIVE 01/22/2024 2200   KETONESUR NEGATIVE 01/22/2024 2200   PROTEINUR NEGATIVE 01/22/2024 2200   UROBILINOGEN 0.2 01/14/2011 1339   NITRITE NEGATIVE 01/22/2024 2200   LEUKOCYTESUR NEGATIVE 01/22/2024 2200   Sepsis Labs Recent Labs  Lab 01/22/24 1549 01/23/24 0442  WBC 11.1* 5.7   Microbiology Recent Results (from the past 240 hours)  Resp panel by RT-PCR (RSV, Flu A&B, Covid) Anterior Nasal Swab     Status: None   Collection Time: 01/22/24  3:52 PM   Specimen: Anterior Nasal Swab  Result Value Ref Range Status   SARS Coronavirus 2 by RT PCR NEGATIVE NEGATIVE  Final    Comment: (NOTE) SARS-CoV-2 target nucleic acids are NOT DETECTED.  The SARS-CoV-2 RNA is generally detectable in upper respiratory specimens during the acute phase of infection. The lowest concentration of SARS-CoV-2 viral copies this assay can detect is 138 copies/mL. A negative result does not preclude SARS-Cov-2 infection and should not be used as the sole basis for treatment or other patient management decisions. A negative result may occur with  improper specimen collection/handling, submission of specimen other than nasopharyngeal swab, presence of viral mutation(s) within the areas targeted by this assay, and inadequate number of viral copies(<138 copies/mL). A negative result must be combined with clinical observations, patient history, and epidemiological information. The expected result is Negative.  Fact Sheet for Patients:  bloggercourse.com  Fact Sheet for Healthcare Providers:  seriousbroker.it  This test is no t yet approved or cleared by the [place]  FDA and  has been authorized for detection and/or diagnosis of SARS-CoV-2 by FDA under an Emergency Use Authorization (EUA). This EUA will remain  in effect (meaning this test can be used) for the duration of the COVID-19 declaration under Section 564(b)(1) of the Act, 21 U.S.C.section 360bbb-3(b)(1), unless the authorization is terminated  or revoked sooner.       Influenza A by PCR NEGATIVE NEGATIVE Final   Influenza B by PCR NEGATIVE NEGATIVE Final    Comment: (NOTE) The Xpert Xpress SARS-CoV-2/FLU/RSV plus assay is intended as an aid in the diagnosis of influenza from Nasopharyngeal swab specimens and should not be used as a sole basis for treatment. Nasal washings and aspirates are unacceptable for Xpert Xpress SARS-CoV-2/FLU/RSV testing.  Fact Sheet for Patients: bloggercourse.com  Fact Sheet for Healthcare  Providers: seriousbroker.it  This test is not yet approved or  cleared by the [place]  FDA and has been authorized for detection and/or diagnosis of SARS-CoV-2 by FDA under an Emergency Use Authorization (EUA). This EUA will remain in effect (meaning this test can be used) for the duration of the COVID-19 declaration under Section 564(b)(1) of the Act, 21 U.S.C. section 360bbb-3(b)(1), unless the authorization is terminated or revoked.     Resp Syncytial Virus by PCR NEGATIVE NEGATIVE Final    Comment: (NOTE) Fact Sheet for Patients: bloggercourse.com  Fact Sheet for Healthcare Providers: seriousbroker.it  This test is not yet approved or cleared by the [place]  FDA and has been authorized for detection and/or diagnosis of SARS-CoV-2 by FDA under an Emergency Use Authorization (EUA). This EUA will remain in effect (meaning this test can be used) for the duration of the COVID-19 declaration under Section 564(b)(1) of the Act, 21 U.S.C. section 360bbb-3(b)(1), unless the authorization is terminated or revoked.  Performed at [facility], [address], [place], [place] [address]   C Difficile Quick Screen w PCR reflex     Status: Abnormal   Collection Time: 01/23/24  9:17 AM   Specimen: STOOL  Result Value Ref Range Status   C Diff antigen POSITIVE (A) NEGATIVE Final   C Diff toxin NEGATIVE NEGATIVE Final   C Diff interpretation Results are indeterminate. See PCR results.  Final    Comment: RESULT CALLED TO, READ BACK BY AND VERIFIED WITH: K DILLARD AT 1104 ON 01.12.26 BY ADGER J  Performed at [facility], [address] [place], [place] [address]   C. Diff by PCR, Reflexed     Status: Abnormal   Collection Time: 01/23/24  9:17 AM  Result Value Ref Range Status   Toxigenic C. Difficile by PCR POSITIVE (A) NEGATIVE Final    Comment: Positive for toxigenic C. difficile with little to no toxin  production. Only treat if clinical presentation suggests symptomatic illness.   Hypervirulent Strain PRESUMPTIVE NEGATIVE PRESUMPTIVE NEGATIVE Final    Comment: Performed at [facility], [address] [place], [place] [address]     Time coordinating discharge:  I have spent 35 minutes face to face with the patient and on the ward discussing the patients care, assessment, plan and disposition with other care givers. >50% of the time was devoted counseling the patient about the risks and benefits of treatment/Discharge disposition and coordinating care.   SIGNED:   Burgess JAYSON Dare, MD  [facility] 01/24/2024, 11:57 AM   If 7PM-7AM, please contact night-coverage      [1]  Allergies Allergen Reactions   Cephalexin Hives   Coumadin  [Warfarin Sodium ] Swelling   Trazodone And Nefazodone Other (See Comments)    migraine

## 2024-01-25 LAB — GI PATHOGEN PANEL BY PCR, STOOL
